# Patient Record
Sex: Male | Born: 1950 | Race: Black or African American | Hispanic: No | Marital: Single | Smoking: Former smoker
Health system: Southern US, Community
[De-identification: ages and names within clinical notes are randomized; demographics above are authoritative.]

## PROBLEM LIST (undated history)

## (undated) DIAGNOSIS — F1910 Other psychoactive substance abuse, uncomplicated: Secondary | ICD-10-CM

## (undated) DIAGNOSIS — I671 Cerebral aneurysm, nonruptured: Secondary | ICD-10-CM

## (undated) DIAGNOSIS — G8190 Hemiplegia, unspecified affecting unspecified side: Secondary | ICD-10-CM

## (undated) DIAGNOSIS — R269 Unspecified abnormalities of gait and mobility: Secondary | ICD-10-CM

## (undated) DIAGNOSIS — N1330 Unspecified hydronephrosis: Secondary | ICD-10-CM

## (undated) DIAGNOSIS — Z86010 Personal history of colonic polyps: Secondary | ICD-10-CM

## (undated) DIAGNOSIS — F0150 Vascular dementia without behavioral disturbance: Secondary | ICD-10-CM

## (undated) DIAGNOSIS — E7800 Pure hypercholesterolemia, unspecified: Secondary | ICD-10-CM

## (undated) DIAGNOSIS — H9190 Unspecified hearing loss, unspecified ear: Secondary | ICD-10-CM

## (undated) DIAGNOSIS — E559 Vitamin D deficiency, unspecified: Secondary | ICD-10-CM

## (undated) DIAGNOSIS — R32 Unspecified urinary incontinence: Secondary | ICD-10-CM

## (undated) DIAGNOSIS — I10 Essential (primary) hypertension: Secondary | ICD-10-CM

## (undated) DIAGNOSIS — R0600 Dyspnea, unspecified: Secondary | ICD-10-CM

## (undated) DIAGNOSIS — I872 Venous insufficiency (chronic) (peripheral): Secondary | ICD-10-CM

## (undated) HISTORY — PX: TUMOR REMOVAL: SHX12

## (undated) HISTORY — DX: Essential (primary) hypertension: I10

## (undated) HISTORY — PX: HIP SURGERY: SHX245

## (undated) HISTORY — DX: Other psychoactive substance abuse, uncomplicated: F19.10

## (undated) HISTORY — DX: Cerebral aneurysm, nonruptured: I67.1

## (undated) HISTORY — PX: VENTRICULOPERITONEAL SHUNT: SHX204

## (undated) HISTORY — DX: Personal history of colonic polyps: Z86.010

## (undated) HISTORY — DX: Unspecified hearing loss, unspecified ear: H91.90

## (undated) HISTORY — DX: Unspecified urinary incontinence: R32

## (undated) HISTORY — DX: Unspecified abnormalities of gait and mobility: R26.9

---

## 2009-09-06 ENCOUNTER — Ambulatory Visit: Payer: Self-pay | Admitting: Internal Medicine

## 2009-09-06 ENCOUNTER — Encounter (INDEPENDENT_AMBULATORY_CARE_PROVIDER_SITE_OTHER): Payer: Self-pay | Admitting: Family Medicine

## 2009-09-06 LAB — CONVERTED CEMR LAB
ALT: 23 units/L (ref 0–53)
AST: 26 units/L (ref 0–37)
Basophils Relative: 1 % (ref 0–1)
Chloride: 105 meq/L (ref 96–112)
Eosinophils Relative: 4 % (ref 0–5)
Hemoglobin: 13.6 g/dL (ref 13.0–17.0)
Lymphs Abs: 1.4 10*3/uL (ref 0.7–4.0)
MCHC: 32.5 g/dL (ref 30.0–36.0)
MCV: 88.2 fL (ref 78.0–100.0)
Monocytes Relative: 6 % (ref 3–12)
Neutro Abs: 3.5 10*3/uL (ref 1.7–7.7)
Neutrophils Relative %: 64 % (ref 43–77)
Sodium: 143 meq/L (ref 135–145)
TSH: 2.632 microintl units/mL (ref 0.350–4.500)
Total Bilirubin: 0.3 mg/dL (ref 0.3–1.2)
Total Protein: 7.3 g/dL (ref 6.0–8.3)
WBC: 5.5 10*3/uL (ref 4.0–10.5)

## 2010-04-15 ENCOUNTER — Encounter: Payer: Self-pay | Admitting: Internal Medicine

## 2010-04-15 ENCOUNTER — Encounter: Payer: Self-pay | Admitting: Family Medicine

## 2011-10-17 ENCOUNTER — Emergency Department (HOSPITAL_COMMUNITY): Payer: Medicaid Other | Admitting: Anesthesiology

## 2011-10-17 ENCOUNTER — Encounter (HOSPITAL_COMMUNITY): Payer: Self-pay | Admitting: Anesthesiology

## 2011-10-17 ENCOUNTER — Inpatient Hospital Stay (HOSPITAL_COMMUNITY)
Admission: EM | Admit: 2011-10-17 | Discharge: 2011-10-29 | DRG: 031 | Disposition: A | Discharge status: Skilled Nursing Facility | Payer: Medicaid Other | Attending: Neurosurgery | Admitting: Neurosurgery

## 2011-10-17 ENCOUNTER — Emergency Department (HOSPITAL_COMMUNITY): Payer: Medicaid Other

## 2011-10-17 ENCOUNTER — Encounter (HOSPITAL_COMMUNITY)
Admission: EM | Disposition: A | Discharge status: Skilled Nursing Facility | Payer: Self-pay | Source: Home / Self Care | Attending: Neurosurgery

## 2011-10-17 ENCOUNTER — Encounter (HOSPITAL_COMMUNITY): Payer: Self-pay

## 2011-10-17 ENCOUNTER — Inpatient Hospital Stay: Admit: 2011-10-17 | Payer: Self-pay | Admitting: Neurosurgery

## 2011-10-17 DIAGNOSIS — R410 Disorientation, unspecified: Secondary | ICD-10-CM | POA: Diagnosis present

## 2011-10-17 DIAGNOSIS — G9340 Encephalopathy, unspecified: Secondary | ICD-10-CM | POA: Diagnosis present

## 2011-10-17 DIAGNOSIS — Z23 Encounter for immunization: Secondary | ICD-10-CM

## 2011-10-17 DIAGNOSIS — T85695A Other mechanical complication of other nervous system device, implant or graft, initial encounter: Principal | ICD-10-CM | POA: Diagnosis present

## 2011-10-17 DIAGNOSIS — R471 Dysarthria and anarthria: Secondary | ICD-10-CM | POA: Diagnosis present

## 2011-10-17 DIAGNOSIS — R4182 Altered mental status, unspecified: Secondary | ICD-10-CM | POA: Diagnosis present

## 2011-10-17 DIAGNOSIS — Y831 Surgical operation with implant of artificial internal device as the cause of abnormal reaction of the patient, or of later complication, without mention of misadventure at the time of the procedure: Secondary | ICD-10-CM | POA: Diagnosis present

## 2011-10-17 DIAGNOSIS — I10 Essential (primary) hypertension: Secondary | ICD-10-CM | POA: Diagnosis present

## 2011-10-17 DIAGNOSIS — Z781 Physical restraint status: Secondary | ICD-10-CM | POA: Diagnosis present

## 2011-10-17 DIAGNOSIS — E870 Hyperosmolality and hypernatremia: Secondary | ICD-10-CM | POA: Diagnosis not present

## 2011-10-17 DIAGNOSIS — R34 Anuria and oliguria: Secondary | ICD-10-CM | POA: Diagnosis not present

## 2011-10-17 DIAGNOSIS — R000 Tachycardia, unspecified: Secondary | ICD-10-CM | POA: Diagnosis present

## 2011-10-17 DIAGNOSIS — R404 Transient alteration of awareness: Secondary | ICD-10-CM | POA: Diagnosis present

## 2011-10-17 DIAGNOSIS — N289 Disorder of kidney and ureter, unspecified: Secondary | ICD-10-CM | POA: Diagnosis present

## 2011-10-17 DIAGNOSIS — G919 Hydrocephalus, unspecified: Secondary | ICD-10-CM | POA: Diagnosis present

## 2011-10-17 DIAGNOSIS — G911 Obstructive hydrocephalus: Secondary | ICD-10-CM | POA: Diagnosis present

## 2011-10-17 DIAGNOSIS — T07XXXA Unspecified multiple injuries, initial encounter: Secondary | ICD-10-CM

## 2011-10-17 HISTORY — PX: VENTRICULOPERITONEAL SHUNT: SHX204

## 2011-10-17 LAB — URINALYSIS, ROUTINE W REFLEX MICROSCOPIC
Bilirubin Urine: NEGATIVE
Glucose, UA: NEGATIVE mg/dL
Ketones, ur: 40 mg/dL — AB
Nitrite: NEGATIVE
pH: 6 (ref 5.0–8.0)

## 2011-10-17 LAB — CSF CELL COUNT WITH DIFFERENTIAL: Other Cells, CSF: 0

## 2011-10-17 LAB — RAPID URINE DRUG SCREEN, HOSP PERFORMED
Amphetamines: NOT DETECTED
Barbiturates: NOT DETECTED
Benzodiazepines: NOT DETECTED
Cocaine: NOT DETECTED
Tetrahydrocannabinol: NOT DETECTED

## 2011-10-17 LAB — COMPREHENSIVE METABOLIC PANEL
AST: 25 U/L (ref 0–37)
Alkaline Phosphatase: 64 U/L (ref 39–117)
BUN: 15 mg/dL (ref 6–23)
CO2: 20 mEq/L (ref 19–32)
Chloride: 106 mEq/L (ref 96–112)
Creatinine, Ser: 1.4 mg/dL — ABNORMAL HIGH (ref 0.50–1.35)
GFR calc non Af Amer: 53 mL/min — ABNORMAL LOW (ref >90)
Potassium: 3.9 mEq/L (ref 3.5–5.1)
Total Bilirubin: 0.1 mg/dL — ABNORMAL LOW (ref 0.3–1.2)

## 2011-10-17 LAB — GRAM STAIN

## 2011-10-17 LAB — URINE MICROSCOPIC-ADD ON

## 2011-10-17 LAB — CBC
HCT: 42.4 % (ref 39.0–52.0)
MCV: 85.8 fL (ref 78.0–100.0)
Platelets: 218 10*3/uL (ref 150–400)
RBC: 4.94 MIL/uL (ref 4.22–5.81)
WBC: 8 10*3/uL (ref 4.0–10.5)

## 2011-10-17 LAB — MRSA PCR SCREENING: MRSA by PCR: NEGATIVE

## 2011-10-17 LAB — PROTIME-INR: Prothrombin Time: 16.3 seconds — ABNORMAL HIGH (ref 11.6–15.2)

## 2011-10-17 LAB — PROTEIN AND GLUCOSE, CSF: Glucose, CSF: 71 mg/dL (ref 43–76)

## 2011-10-17 LAB — AMMONIA: Ammonia: 23 umol/L (ref 11–60)

## 2011-10-17 SURGERY — SHUNT INSERTION VENTRICULAR-PERITONEAL
Anesthesia: General | Site: Head | Laterality: Right | Wound class: Clean

## 2011-10-17 MED ORDER — LACTATED RINGERS IV SOLN
INTRAVENOUS | Status: DC
  Administered 2011-10-17: 16:00:00 via INTRAVENOUS
  Administered 2011-10-17: 75 mL/h via INTRAVENOUS
  Administered 2011-10-18: 19:00:00 via INTRAVENOUS
  Administered 2011-10-19: 75 mL/h via INTRAVENOUS

## 2011-10-17 MED ORDER — MORPHINE SULFATE 2 MG/ML IJ SOLN
1.0000 mg | INTRAMUSCULAR | Status: DC | PRN
  Administered 2011-10-17 – 2011-10-22 (×4): 2 mg via INTRAVENOUS
  Filled 2011-10-17 (×4): qty 1

## 2011-10-17 MED ORDER — PHENYLEPHRINE HCL 10 MG/ML IJ SOLN
10.0000 mg | INTRAVENOUS | Status: DC | PRN
  Administered 2011-10-17: 50 ug/min via INTRAVENOUS

## 2011-10-17 MED ORDER — LORAZEPAM 2 MG/ML IJ SOLN
INTRAMUSCULAR | Status: AC
  Filled 2011-10-17: qty 1

## 2011-10-17 MED ORDER — HYDROCODONE-ACETAMINOPHEN 5-325 MG PO TABS
1.0000 | ORAL_TABLET | ORAL | Status: DC | PRN
  Administered 2011-10-24 – 2011-10-27 (×6): 1 via ORAL
  Filled 2011-10-17 (×8): qty 1

## 2011-10-17 MED ORDER — SUCCINYLCHOLINE CHLORIDE 20 MG/ML IJ SOLN
INTRAMUSCULAR | Status: DC | PRN
  Administered 2011-10-17: 200 mg via INTRAVENOUS

## 2011-10-17 MED ORDER — LABETALOL HCL 5 MG/ML IV SOLN: 10.0000 mg | INTRAVENOUS | Status: DC | PRN

## 2011-10-17 MED ORDER — LORAZEPAM 2 MG/ML IJ SOLN
1.0000 mg | Freq: Four times a day (QID) | INTRAMUSCULAR | Status: DC | PRN
  Administered 2011-10-17 – 2011-10-18 (×3): 1 mg via INTRAVENOUS
  Filled 2011-10-17 (×2): qty 1

## 2011-10-17 MED ORDER — LORAZEPAM 1 MG PO TABS
1.0000 mg | ORAL_TABLET | Freq: Once | ORAL | Status: AC
  Administered 2011-10-17: 1 mg via ORAL

## 2011-10-17 MED ORDER — THROMBIN 5000 UNITS EX SOLR
CUTANEOUS | Status: DC | PRN
  Administered 2011-10-17 (×2): 5000 [IU] via TOPICAL

## 2011-10-17 MED ORDER — HEMOSTATIC AGENTS (NO CHARGE) OPTIME
TOPICAL | Status: DC | PRN
  Administered 2011-10-17: 1 via TOPICAL

## 2011-10-17 MED ORDER — GLYCOPYRROLATE 0.2 MG/ML IJ SOLN
INTRAMUSCULAR | Status: DC | PRN
  Administered 2011-10-17: 0.6 mg via INTRAVENOUS

## 2011-10-17 MED ORDER — BUPIVACAINE-EPINEPHRINE 0.5% -1:200000 IJ SOLN
INTRAMUSCULAR | Status: DC | PRN
  Administered 2011-10-17: 17 mL

## 2011-10-17 MED ORDER — MEPERIDINE HCL 25 MG/ML IJ SOLN: 6.2500 mg | INTRAMUSCULAR | Status: DC | PRN

## 2011-10-17 MED ORDER — ROCURONIUM BROMIDE 100 MG/10ML IV SOLN
INTRAVENOUS | Status: DC | PRN
  Administered 2011-10-17: 50 mg via INTRAVENOUS

## 2011-10-17 MED ORDER — ZIPRASIDONE MESYLATE 20 MG IM SOLR
10.0000 mg | Freq: Once | INTRAMUSCULAR | Status: AC
  Administered 2011-10-17: 10 mg via INTRAMUSCULAR
  Filled 2011-10-17: qty 20

## 2011-10-17 MED ORDER — CEFAZOLIN SODIUM 1-5 GM-% IV SOLN
INTRAVENOUS | Status: DC | PRN
  Administered 2011-10-17: 2 g via INTRAVENOUS

## 2011-10-17 MED ORDER — CEFAZOLIN SODIUM-DEXTROSE 2-3 GM-% IV SOLR
2.0000 g | Freq: Three times a day (TID) | INTRAVENOUS | Status: AC
  Administered 2011-10-17 – 2011-10-18 (×2): 2 g via INTRAVENOUS
  Filled 2011-10-17 (×2): qty 50

## 2011-10-17 MED ORDER — PROMETHAZINE HCL 25 MG/ML IJ SOLN
6.2500 mg | INTRAMUSCULAR | Status: DC | PRN
  Filled 2011-10-17: qty 1

## 2011-10-17 MED ORDER — PANTOPRAZOLE SODIUM 40 MG IV SOLR
40.0000 mg | Freq: Every day | INTRAVENOUS | Status: DC
  Administered 2011-10-17 – 2011-10-23 (×7): 40 mg via INTRAVENOUS
  Filled 2011-10-17 (×9): qty 40

## 2011-10-17 MED ORDER — MIDAZOLAM HCL 2 MG/2ML IJ SOLN: 0.5000 mg | Freq: Once | INTRAMUSCULAR | Status: DC | PRN

## 2011-10-17 MED ORDER — PROMETHAZINE HCL 25 MG PO TABS: 12.5000 mg | ORAL_TABLET | ORAL | Status: DC | PRN

## 2011-10-17 MED ORDER — LORAZEPAM 0.5 MG PO TABS
ORAL_TABLET | ORAL | Status: AC
  Filled 2011-10-17: qty 2

## 2011-10-17 MED ORDER — PHENYLEPHRINE HCL 10 MG/ML IJ SOLN
INTRAMUSCULAR | Status: DC | PRN
  Administered 2011-10-17 (×2): 80 ug via INTRAVENOUS
  Administered 2011-10-17: 120 ug via INTRAVENOUS
  Administered 2011-10-17: 160 ug via INTRAVENOUS
  Administered 2011-10-17: 40 ug via INTRAVENOUS

## 2011-10-17 MED ORDER — 0.9 % SODIUM CHLORIDE (POUR BTL) OPTIME
TOPICAL | Status: DC | PRN
  Administered 2011-10-17: 1000 mL

## 2011-10-17 MED ORDER — SODIUM CHLORIDE 0.9 % IV SOLN
INTRAVENOUS | Status: DC | PRN
  Administered 2011-10-17 (×2): via INTRAVENOUS

## 2011-10-17 MED ORDER — NEOSTIGMINE METHYLSULFATE 1 MG/ML IJ SOLN
INTRAMUSCULAR | Status: DC | PRN
  Administered 2011-10-17: 4 mg via INTRAVENOUS

## 2011-10-17 MED ORDER — HYDROMORPHONE HCL PF 1 MG/ML IJ SOLN: 0.2500 mg | INTRAMUSCULAR | Status: DC | PRN

## 2011-10-17 MED ORDER — PROPOFOL 10 MG/ML IV EMUL
INTRAVENOUS | Status: DC | PRN
  Administered 2011-10-17: 200 mg via INTRAVENOUS

## 2011-10-17 MED ORDER — ACETAMINOPHEN 650 MG RE SUPP: 650.0000 mg | RECTAL | Status: DC | PRN

## 2011-10-17 MED ORDER — ACETAMINOPHEN 325 MG PO TABS
650.0000 mg | ORAL_TABLET | ORAL | Status: DC | PRN
  Administered 2011-10-23: 650 mg via ORAL
  Filled 2011-10-17: qty 1

## 2011-10-17 MED ORDER — LIDOCAINE-EPINEPHRINE 1 %-1:100000 IJ SOLN
INTRAMUSCULAR | Status: DC | PRN
  Administered 2011-10-17: 17 mL

## 2011-10-17 MED ORDER — DOCUSATE SODIUM 100 MG PO CAPS
100.0000 mg | ORAL_CAPSULE | Freq: Two times a day (BID) | ORAL | Status: DC
  Administered 2011-10-22: 100 mg via ORAL
  Filled 2011-10-17 (×11): qty 1

## 2011-10-17 MED ORDER — BACITRACIN ZINC 500 UNIT/GM EX OINT
TOPICAL_OINTMENT | CUTANEOUS | Status: DC | PRN
  Administered 2011-10-17: 1 via TOPICAL

## 2011-10-17 MED ORDER — ONDANSETRON HCL 4 MG PO TABS: 4.0000 mg | ORAL_TABLET | ORAL | Status: DC | PRN

## 2011-10-17 MED ORDER — ONDANSETRON HCL 4 MG/2ML IJ SOLN: 4.0000 mg | INTRAMUSCULAR | Status: DC | PRN

## 2011-10-17 SURGICAL SUPPLY — 75 items
BAG DECANTER FOR FLEXI CONT (MISCELLANEOUS) ×2 IMPLANT
BENZOIN TINCTURE PRP APPL 2/3 (GAUZE/BANDAGES/DRESSINGS) ×2 IMPLANT
BLADE SURG 10 STRL SS (BLADE) ×4 IMPLANT
BLADE SURG 15 STRL LF DISP TIS (BLADE) ×1 IMPLANT
BLADE SURG 15 STRL SS (BLADE) ×1
BLADE SURG ROTATE 9660 (MISCELLANEOUS) ×4 IMPLANT
BNDG COHESIVE 4X5 TAN NS LF (GAUZE/BANDAGES/DRESSINGS) ×2 IMPLANT
BOOT SUTURE AID YELLOW STND (SUTURE) IMPLANT
BRUSH SCRUB EZ 1% IODOPHOR (MISCELLANEOUS) IMPLANT
BRUSH SCRUB EZ PLAIN DRY (MISCELLANEOUS) ×2 IMPLANT
BUR ACORN 6.0 PRECISION (BURR) ×2 IMPLANT
CANISTER SUCTION 2500CC (MISCELLANEOUS) ×2 IMPLANT
CLIP RANEY DISP (INSTRUMENTS) IMPLANT
CLOTH BEACON ORANGE TIMEOUT ST (SAFETY) ×2 IMPLANT
CONT SPEC 4OZ CLIKSEAL STRL BL (MISCELLANEOUS) ×4 IMPLANT
CORDS BIPOLAR (ELECTRODE) ×2 IMPLANT
COVER MAYO STAND STRL (DRAPES) ×2 IMPLANT
DRAPE INCISE IOBAN 85X60 (DRAPES) ×2 IMPLANT
DRAPE ORTHO SPLIT 77X108 STRL (DRAPES) ×1
DRAPE POUCH INSTRU U-SHP 10X18 (DRAPES) ×2 IMPLANT
DRAPE PROXIMA HALF (DRAPES) ×2 IMPLANT
DRAPE STERI IOBAN 125X83 (DRAPES) ×2 IMPLANT
DRAPE SURG 17X23 STRL (DRAPES) ×8 IMPLANT
DRAPE SURG ORHT 6 SPLT 77X108 (DRAPES) ×1 IMPLANT
DRESSING TELFA 8X3 (GAUZE/BANDAGES/DRESSINGS) ×2 IMPLANT
ELECT CAUTERY BLADE 6.4 (BLADE) ×2 IMPLANT
ELECT REM PT RETURN 9FT ADLT (ELECTROSURGICAL) ×2
ELECTRODE REM PT RTRN 9FT ADLT (ELECTROSURGICAL) ×1 IMPLANT
GAUZE SPONGE 4X4 16PLY XRAY LF (GAUZE/BANDAGES/DRESSINGS) ×2 IMPLANT
GLOVE BIO SURGEON STRL SZ8.5 (GLOVE) ×2 IMPLANT
GLOVE EXAM NITRILE LRG STRL (GLOVE) IMPLANT
GLOVE EXAM NITRILE MD LF STRL (GLOVE) ×4 IMPLANT
GLOVE EXAM NITRILE XL STR (GLOVE) IMPLANT
GLOVE EXAM NITRILE XS STR PU (GLOVE) IMPLANT
GLOVE SS BIOGEL STRL SZ 8 (GLOVE) ×1 IMPLANT
GLOVE SUPERSENSE BIOGEL SZ 8 (GLOVE) ×1
GOWN BRE IMP SLV AUR LG STRL (GOWN DISPOSABLE) IMPLANT
GOWN BRE IMP SLV AUR XL STRL (GOWN DISPOSABLE) ×2 IMPLANT
GOWN STRL REIN 2XL LVL4 (GOWN DISPOSABLE) ×2 IMPLANT
KIT BASIN OR (CUSTOM PROCEDURE TRAY) ×2 IMPLANT
KIT ROOM TURNOVER OR (KITS) ×2 IMPLANT
NEEDLE HYPO 22GX1.5 SAFETY (NEEDLE) ×2 IMPLANT
NS IRRIG 1000ML POUR BTL (IV SOLUTION) ×2 IMPLANT
PACK EENT II TURBAN DRAPE (CUSTOM PROCEDURE TRAY) ×2 IMPLANT
PAD ARMBOARD 7.5X6 YLW CONV (MISCELLANEOUS) ×2 IMPLANT
PATTIES SURGICAL .5 X.5 (GAUZE/BANDAGES/DRESSINGS) IMPLANT
PATTIES SURGICAL 1X1 (DISPOSABLE) ×2 IMPLANT
PENCIL BUTTON HOLSTER BLD 10FT (ELECTRODE) ×2 IMPLANT
SHEATH PERITONEAL INTRO 46 (MISCELLANEOUS) IMPLANT
SHEATH PERITONEAL INTRO 61 (MISCELLANEOUS) ×2 IMPLANT
SPONGE GAUZE 4X4 12PLY (GAUZE/BANDAGES/DRESSINGS) ×2 IMPLANT
SPONGE LAP 4X18 X RAY DECT (DISPOSABLE) ×2 IMPLANT
SPONGE SURGIFOAM ABS GEL SZ50 (HEMOSTASIS) ×2 IMPLANT
STAPLER SKIN PROX WIDE 3.9 (STAPLE) IMPLANT
STAPLER VISISTAT 35W (STAPLE) ×2 IMPLANT
STRIP CLOSURE SKIN 1/2X4 (GAUZE/BANDAGES/DRESSINGS) ×2 IMPLANT
SUT BONE WAX W31G (SUTURE) ×2 IMPLANT
SUT ETHILON 3 0 PS 1 (SUTURE) IMPLANT
SUT NURALON 4 0 TR CR/8 (SUTURE) IMPLANT
SUT SILK 0 TIES 10X30 (SUTURE) ×4 IMPLANT
SUT SILK 3 0 SH 30 (SUTURE) ×2 IMPLANT
SUT VIC AB 1 CT1 18XBRD ANBCTR (SUTURE) ×1 IMPLANT
SUT VIC AB 1 CT1 8-18 (SUTURE) ×1
SUT VIC AB 2-0 CP2 18 (SUTURE) ×4 IMPLANT
SUT VIC AB 3-0 SH 8-18 (SUTURE) ×2 IMPLANT
SYR BULB 3OZ (MISCELLANEOUS) ×2 IMPLANT
SYR CONTROL 10ML LL (SYRINGE) ×2 IMPLANT
TAPE CLOTH SURG 4X10 WHT LF (GAUZE/BANDAGES/DRESSINGS) ×2 IMPLANT
TOWEL OR 17X24 6PK STRL BLUE (TOWEL DISPOSABLE) ×2 IMPLANT
TOWEL OR 17X26 10 PK STRL BLUE (TOWEL DISPOSABLE) ×2 IMPLANT
TRAY FOLEY CATH 14FRSI W/METER (CATHETERS) ×2 IMPLANT
TUBE CONNECTING 12X1/4 (SUCTIONS) ×2 IMPLANT
UNDERPAD 30X30 INCONTINENT (UNDERPADS AND DIAPERS) IMPLANT
VALVE RT ANGLE UNITIZE DIST (Valve) ×2 IMPLANT
WATER STERILE IRR 1000ML POUR (IV SOLUTION) ×2 IMPLANT

## 2011-10-17 NOTE — Progress Notes (Signed)
EXTUBATION:  Patient responsive to voice, good TV with intact gag reflex, VSS.  Suctioned OP and extubated to [facility].  Good airway.    Sandford Craze, MD

## 2011-10-17 NOTE — Anesthesia Preprocedure Evaluation (Addendum)
Anesthesia Evaluation  Patient identified by MRN, date of birth, ID band Patient confused and Patient unresponsive    Reviewed: Allergy & Precautions, Unable to perform ROS - Chart review only  Airway       Dental   Pulmonary          Cardiovascular     Neuro/Psych ? Has had aneurysm clipped at age 61y/o, ?shunt placed at that time? Has ventricular shunt (?VP or [facility]), became very combative and confused, agitated today. ED staff unable to evaluate, is handcuffed and sedated presently    GI/Hepatic   Endo/Other    Renal/GU      Musculoskeletal   Abdominal   Peds  Hematology   Anesthesia Other Findings uncooperative  Reproductive/Obstetrics                          Anesthesia Physical Anesthesia Plan  ASA: III and Emergent  Anesthesia Plan: General   Post-op Pain Management:    Induction: Intravenous and Rapid sequence  Airway Management Planned: Oral ETT  Additional Equipment:   Intra-op Plan:   Post-operative Plan: Extubation in OR  Informed Consent:   Only emergency history available  Plan Discussed with: CRNA and Surgeon  Anesthesia Plan Comments: (Plan routine monitors, RSI GETA)        Anesthesia Quick Evaluation

## 2011-10-17 NOTE — Transfer of Care (Signed)
Immediate Anesthesia Transfer of Care Note  Patient: Kurt Walton  Procedure(s) Performed: Procedure(s) (LRB): SHUNT INSERTION VENTRICULAR-PERITONEAL (Right)  Patient Location: PACU  Anesthesia Type: General  Level of Consciousness: unresponsive and Patient remains intubated per anesthesia plan  Airway & Oxygen Therapy: Patient Spontanous Breathing, Patient connected to T-piece oxygen and Patient remains intubated per anesthesia plan  Post-op Assessment: Report given to PACU RN and Post -op Vital signs reviewed and stable  Post vital signs: Reviewed and stable  Complications: No apparent anesthesia complications

## 2011-10-17 NOTE — ED Notes (Signed)
Pt pulled IV out, pulling wires, trying to get out of bed. Unable to reason with patient. Attempt to reorient patient, try to distract pt with TV. CN notified, sitter ordered for patient

## 2011-10-17 NOTE — ED Notes (Signed)
Pt aggressive. Medication given. Taking pt to neuro OR. Dr. Lovell Sheehan down to see patient. MD notified pt has no IV access due to pulling them out. Will be traveling upstairs with patient. Secretary calling surrounding hospital to determine if patient has records available.

## 2011-10-17 NOTE — ED Notes (Signed)
Pt more calm, no longer fighting staff, resting.

## 2011-10-17 NOTE — Anesthesia Procedure Notes (Addendum)
Performed by: Arlice Colt B   Procedure Name: Intubation Date/Time: 10/17/2011 10:56 AM Performed by: Arlice Colt B Pre-anesthesia Checklist: Patient identified, Emergency Drugs available, Suction available, Patient being monitored and Timeout performed Patient Re-evaluated:Patient Re-evaluated prior to inductionOxygen Delivery Method: Circle system utilized Preoxygenation: Pre-oxygenation with 100% oxygen Intubation Type: IV induction and Rapid sequence Laryngoscope Size: Mac and 3 Grade View: Grade I Tube type: Oral Tube size: 7.5 mm Number of attempts: 1 Airway Equipment and Method: Stylet Placement Confirmation: ETT inserted through vocal cords under direct vision,  positive ETCO2 and breath sounds checked- equal and bilateral Secured at: 23 cm Tube secured with: Tape Dental Injury: Teeth and Oropharynx as per pre-operative assessment

## 2011-10-17 NOTE — Op Note (Signed)
Brief history: The patient is a 61 year old black male who was found with a decreased level of consciousness in his yard today. He was brought to the emergency department. A head CT was obtained and demonstrated hydrocephalus and an old shunt. The patient cannot provide his history and is no family available to discuss and there is no family to discuss the situation with. We are going to proceed with a ventriculoperitoneal shunt as an emergency.  Preop diagnosis: Making hydrocephalus, shunt malfunction  Postop diagnosis: The same  Procedure: Placement of a right retroauricular ventriculoperitoneal shunt (Codman programmable shunt set at 100 mmHg)  Surgeon: Dr. Delma Officer  Assistant: None  Anesthesia: Gen. endotracheal  Specimens: Cerebral spinal fluid  Drains: None  Complications: None  Description of procedure: The patient was brought to the operating room by the anesthesia team. General endotracheal anesthesia was induced. The patient remained in the supine position. A roll was placed under his right shoulder the patient's calvarium was turned to the left. The patient's right scalp was then shaved with clippers and this region as well as his neck thorax and abdomen were prepared with Betadine scrub and Betadine solution. Sterile drapes were applied. I then injected the area to be incised with Marcaine with epinephrine solution.  I used a scalpel to make a right retroauricular scalp incision. We used a cerebellar retractor for exposure. I then made an incision in the patient's right upper quadrant of his abdomen. I then placed a shunt passer from the abdominal incision up to the scalp incision. We then passed the shunt catheter through the passer from the scalp incision to the abdominal incision. We then removed the shunt passer.  I then used a high-speed drill to create a right posterior parietal burr hole. I coagulated the underlying dura. Incised. I used bipolar electrocautery to  quickly the surface of the patient's brain creating small corticotomy. I then passed the trigger catheter into the occipital horn of the lateral ventricle with the first pass and approximately 4 cm. I then removed the stylette and threaded the catheter deeper to approximately 10 cm. There was good flow of cerebrospinal fluid under some pressure. We collect a CSF specimen and sent to the lab. We then cut the ventricular catheter to 10 cm I connected it to the bowel we secured it with a 0 silk tie. Then removed the cerebellar retractor and reapproximated patient's galea with interrupted 20 gargle suture and the skin with stainless steel staples.  I now turned my attention to the abdominal incision. I dissected through the adipose tissue to expose the anterior rectus sheath and divided with the Metzenbaum scissors. We then dissected through the rectus muscle and then identified the posterior rectus sheath. I incised this with the scissors and identified the underlying peritoneum. I incised this with the scissors entering into the perineal cavity. We identified the intestines. We confirmed that the catheter was patent by aspirating through the distal end of the peritoneal catheter but easily gets real spinal fluid. There was also spontaneous flow of cervical spinal fluid. I then inserted the distal end of the peritoneal catheter into the perineum. We then removed the retractors and reapproximated the patient's posterior rectus sheath with a single 2-0 Vicryl suture. The anterior rectus sheath with several interrupted 0 Vicryl sutures. The subcutaneous tissue with 2-0 Vicryl interrupted sutures and the skin with stainless steel staples. The wounds were then coated with bacitracin ointment. A sterile dressing was applied. The drapes were removed. The patient was  subsequently transported to the post anesthesia care unit intubated in stable condition. All sponge instrument and needle counts were reportedly correct at this  case.

## 2011-10-17 NOTE — ED Notes (Addendum)
EDP made aware of elevated 7.0 CK MB. Pt in CT, not lying still, wanting to get off bed. CT staff trying to reassure patient and calm him down. EDP reporting get best scan possible. Pt moved to room closer to nurse station to keep eye on patient. Sitter has been ordered

## 2011-10-17 NOTE — ED Notes (Signed)
Pt acting aggressive. Security at bedside. [employer] handcuffing pt to bed. Pt flailing extremities.

## 2011-10-17 NOTE — Progress Notes (Signed)
Patient ID: Kurt Walton, male   DOB: 08/20/1950, 61 y.o.   MRN: [id number] Subjective:  The patient is somnolent but arousable. He has little change from his preoperative status.  Objective: Vital signs in last 24 hours: Temp:  [98.4 F (36.9 C)-100.1 F (37.8 C)] 100.1 F (37.8 C) (07/25 1800) Pulse Rate:  [95-138] 111  (07/25 1900) Resp:  [15-29] 15  (07/25 1900) BP: (94-131)/(55-74) 100/66 mmHg (07/25 1900) SpO2:  [97 %-100 %] 99 % (07/25 1900) Weight:  [75.7 kg (166 lb 14.2 oz)] 75.7 kg (166 lb 14.2 oz) (07/25 1500)  Intake/Output from previous day:   Intake/Output this shift:    Physical exam patient is arousable, moving all 4 extremities, his pupils are equal, his dressings are clean and dry.  Lab Results:  [facility] 10/17/11 0715  WBC 8.0  HGB 14.7  HCT 42.4  PLT 218   BMET  Basename 10/17/11 0715  NA 146*  K 3.9  CL 106  CO2 20  GLUCOSE 115*  BUN 15  CREATININE 1.40*  CALCIUM 9.5    Studies/Results: Dg Skull 1-3 Views  10/17/2011  **ADDENDUM** CREATED: 10/17/2011 10:03:42  Findings discussed Dr. Lorenso Courier on 10/17/2011 at 1005 hours as well as with the neurosurgeon Dr. Lovell Sheehan.  **END ADDENDUM** SIGNED BY: Genevive Bi, M.D.   10/17/2011  *RADIOLOGY REPORT*  Clinical Data: Altered mental status.  SKULL - 1-3 VIEW  Comparison: None.  Findings: The patient was combative and required five attendants to stabilize the patient for imaging.  The ventriculostomy catheter is not imaged in full. The tip right lateral ventricle. The catheter extends into the central canal at the level of the foramen magnum as seen on comparison CT. There is a branching or fracture of the catheter at the level of the foramen magnum.  There is a second fracture of the catheter approximate 6 cm from the ventriculostomy catheter tip in the fourth ventricle.  IMPRESSION: 1.  Ventriculostomy catheter is fractured 6 cm from the tip in the fourth ventricle. 2.  Ventriculostomy catheters is  fractured and pinched at the level the foramen magnum. 3.  Catheter enters the central canal at the level of the foramen magnum as seen on comparison CT. Original Report Authenticated By: Genevive Bi, M.D.   Dg Chest 1 View  10/17/2011  *RADIOLOGY REPORT*  Clinical Data: Altered mental status  CHEST - 1 VIEW  Comparison: none  Findings: Normal cardiac silhouette.  There is apical capping on the right which may represent pleural fluid.  Mild central venous congestion.  No pulmonary edema or consolidation.  IMPRESSION:  1.  Central venous congestion.  2.  Right apical capping could represent pleural fluid.  Original Report Authenticated By: Genevive Bi, M.D.   Dg Abd 1 View  10/17/2011  *RADIOLOGY REPORT*  Clinical Data: Altered mental status  ABDOMEN - 1 VIEW  Comparison: None.  Findings: No dilated loops of large or small bowel.  No pathologic calcifications.  Scoliosis of the spine is noted.  IMPRESSION: No acute abdominal abnormality.  Original Report Authenticated By: Genevive Bi, M.D.   Ct Head Wo Contrast  10/17/2011  *RADIOLOGY REPORT*  Clinical Data: Altered mental status.  The patient found lying in yard.  Confusion,  CT HEAD WITHOUT CONTRAST  Technique:  Contiguous axial images were obtained from the base of the skull through the vertex without contrast.  Comparison: None.  Findings: There is a ventriculostomy catheter with the tip in the atrium of the right lateral ventricle.  This catheter extends through the occipital bone and reenters the occipital bone and extends into the central canal level of the foramen magnum.  There chronic cystic expansion along the catheter tract within the occipital bone.  There  moderate hydrocephalus with dilatation of the lateral ventricles, temporal horns, third ventricle and fourth ventricle. The fourth ventricle  is significantly dilated.  There is no evidence of cortical infarction.  No intracranial hemorrhage.  No evidence of skull base fracture.   There is no fluid in the paranasal sinuses or mastoid air cells.  IMPRESSION:  1.  Moderate hydrocephalus with marked expansion of the fourth ventricle in patient with ventriculostomy catheter. No comparison available.  2.  Right ventriculostomy catheter re-enters the central canal at the level of the foramen magnum.  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  Original Report Authenticated By: Genevive Bi, M.D.    Assessment/Plan: Status post ventriculoperitoneal shunt: The patient is neurologically stable. We will check a CAT scan in the morning.  LOS: 0 days     Kayce Chismar D 10/17/2011, 7:33 PM

## 2011-10-17 NOTE — ED Notes (Signed)
Security at bedside. Pt being uncooperative. Trying to get out of bed, taking gown, wires off. Pt sweating.

## 2011-10-17 NOTE — OR Nursing (Signed)
Codman Programmable Valve: Right Angle and Unitized Distal Catheter. Programmed to 100 mm or H20 with help from Brayton El, [employer] representative and per surgeon.

## 2011-10-17 NOTE — ED Notes (Signed)
Pt asked for urine sample unable to provide at this time.  

## 2011-10-17 NOTE — Anesthesia Postprocedure Evaluation (Signed)
  Anesthesia Post-op Note  Patient: Kurt Walton  Procedure(s) Performed: Procedure(s) (LRB): SHUNT INSERTION VENTRICULAR-PERITONEAL (Right)  Patient Location: PACU  Anesthesia Type: General  Level of Consciousness: sedated  Airway and Oxygen Therapy: Patient Spontanous Breathing and Patient connected to face mask oxygen  Post-op Pain: none  Post-op Assessment: Post-op Vital signs reviewed, Patient's Cardiovascular Status Stable, Respiratory Function Stable, Patent Airway, No signs of Nausea or vomiting and Pain level controlled  Post-op Vital Signs: Reviewed and stable  Complications: No apparent anesthesia complications

## 2011-10-17 NOTE — ED Notes (Addendum)
Pt in OR valuables with patient. Report given to Dr. Colon Branch.

## 2011-10-17 NOTE — ED Notes (Signed)
Patient transported to CT 

## 2011-10-17 NOTE — Progress Notes (Signed)
Patient is agitated with heart rate of 145. MD notified of patients heart rate and agitation. 1 mg Ativan and 2 mg Morphine ordered and given. Will continue to assess.

## 2011-10-17 NOTE — ED Provider Notes (Addendum)
History     CSN: [id number]  Arrival date & time 10/17/11  1191   First MD Initiated Contact with Patient 10/17/11 [phone]      Chief Complaint  Patient presents with  . Altered Mental Status    (Consider location/radiation/quality/duration/timing/severity/associated sxs/prior treatment) Patient is a 61 y.o. male presenting with altered mental status. The history is provided by the EMS personnel. The history is limited by the condition of the patient (pt altered, can provide no history).  Altered Mental Status This is a new problem. Episode onset: unknown. Progression since onset: unknown. Pertinent negatives include no chest pain, no abdominal pain, no headaches and no shortness of breath. Associated symptoms comments: Pt denies any complaints. He has tried nothing for the symptoms.  Pt was found down outside by a neighbor and has only been able to provide his name to EMS staff.  No past medical history on file.  No past surgical history on file.  No family history on file.  History  Substance Use Topics  . Smoking status: Not on file  . Smokeless tobacco: Not on file  . Alcohol Use: Not on file      Review of Systems  Unable to perform ROS: Mental status change  Constitutional: Negative for fever.  HENT: Negative for ear pain, congestion, facial swelling, rhinorrhea, neck pain, neck stiffness and ear discharge.   Eyes: Negative for photophobia, pain, discharge, redness and itching.  Respiratory: Negative for cough, chest tightness and shortness of breath.   Cardiovascular: Negative for chest pain and leg swelling.  Gastrointestinal: Positive for diarrhea. Negative for vomiting, abdominal pain and abdominal distention.  Genitourinary: Negative for flank pain and penile pain.  Musculoskeletal: Negative for back pain.       Uses a walking stick and wears a support wrap on his right knee - "I always wear it and use my stick"  Skin: Positive for wound.       "I don't know  where it came from" - response to questions regarding abrasion on left knee  Neurological: Negative for tremors, seizures, light-headedness and headaches.  Psychiatric/Behavioral: Positive for altered mental status.    Allergies  Review of patient's allergies indicates no known allergies.  Home Medications   Current Outpatient Rx  Name Route Sig Dispense Refill  . ASPIRIN 81 MG PO CHEW Oral Chew 81 mg by mouth daily.      BP 124/71  Temp 98.8 F (37.1 C) (Oral)  Resp 16  SpO2 98%  Physical Exam  Constitutional: He appears well-developed and well-nourished. He is not intubated.  HENT:  Head: Normocephalic and atraumatic.  Right Ear: External ear normal.  Left Ear: External ear normal.  Nose: Nose normal.  Mouth/Throat: Oropharynx is clear and moist. No oropharyngeal exudate.  Eyes: Conjunctivae and EOM are normal. Pupils are equal, round, and reactive to light. Right eye exhibits no discharge. Left eye exhibits no discharge. No scleral icterus.  Neck: Normal range of motion. Neck supple. No JVD present. Carotid bruit is not present. No tracheal deviation present. No thyromegaly present.  Cardiovascular: Normal rate, regular rhythm, S1 normal, S2 normal, normal heart sounds and intact distal pulses.  Exam reveals no gallop and no friction rub.   No murmur heard. Pulmonary/Chest: Effort normal and breath sounds normal. No accessory muscle usage or stridor. No apnea, not tachypneic and not bradypneic. He is not intubated. No respiratory distress. He has no wheezes. He has no rales. He exhibits no tenderness.  Abdominal: Soft. Bowel  sounds are normal. He exhibits no shifting dullness, no distension, no fluid wave, no abdominal bruit, no ascites, no pulsatile midline mass and no mass. There is no tenderness. There is no rigidity, no rebound, no guarding, no CVA tenderness, no tenderness at McBurney's point and negative Murphy's sign.  Musculoskeletal: Normal range of motion. He exhibits  no edema.       Superficial abrasion overlying left knee, nl ROM, no signif discomfort with palp, no joint swelling or cripitans  Lymphadenopathy:    He has no cervical adenopathy.  Neurological: He is alert. He has normal strength. He displays no atrophy and no tremor. No cranial nerve deficit or sensory deficit. He exhibits normal muscle tone. He displays no seizure activity. GCS eye subscore is 4. GCS verbal subscore is 4. GCS motor subscore is 6. He displays no Babinski's sign on the right side. He displays no Babinski's sign on the left side.  Skin: Skin is warm and dry. No rash noted. No erythema. No pallor.     Psychiatric: His affect is not angry and not labile. His speech is not rapid and/or pressured, not delayed, not tangential and not slurred. He is aggressive, slowed and withdrawn. He is not agitated, is not hyperactive, not actively hallucinating and not combative. Thought content is paranoid. Cognition and memory are impaired. He expresses inappropriate judgment. He does not express impulsivity. He does not exhibit a depressed mood. He exhibits abnormal recent memory and abnormal remote memory.       Pt confused, only oriented to person, becomes defensive when asked questions regarding his current state, where he was found, his current location, past medical history.  He is however cooperative during physical exam. He is attentive.    ED Course  Procedures (including critical care time)   Labs Reviewed  CBC  COMPREHENSIVE METABOLIC PANEL  ETHANOL  AMMONIA  URINALYSIS, ROUTINE W REFLEX MICROSCOPIC  URINE RAPID DRUG SCREEN (HOSP PERFORMED)  PROTIME-INR  CK TOTAL AND CKMB   No results found.   No diagnosis found.  Date: 10/17/2011  Rate:100 bpm  Rhythm: sinus tachycardia  QRS Axis: normal  Intervals: normal  ST/T Wave abnormalities: nonspecific ST changes  Conduction Disutrbances:none  Narrative Interpretation: sinus rhythm with nonspec anterior ST changes  Old EKG  Reviewed: none available      MDM  61 yo presents confused, found down near home - currently only oriented to self but demonstrates stable vital signs and no distress.  Will initiate altered mental status evaluation including routine labs, tox screen, CK, CT head - will review results as available, perform serial reassessments and provide dispo as appropriate.  Pt has requested to leave the ER on numerous occasions now.  He is awake, alert, but still not oriented to his surroundings and demonstrates little to no insight into why he is in the emergency department.  Note an elevated CK/MB and mild elevation in creatinine but ammonia and alcohol levels are normal.  CT scan reveals ventriculostomy with distention of the 4th ventricle and hydrocephalus.  Kurt Walton cannot provide a history of prev brain surgery and there is no available previous imaging to state that this is a stable v an acute finding.  Plan obtain shunt series, on completion will contact the neurosurgeon oncall for further evaluation.  Anticipate admission secondary to altered mental status and hydrocephalus.  Pt is stable, neurosurgeon is at bedside.  Discussed his abnormal CT of head with Dr. Lovell Sheehan (nsgy) and Dr. Ty Hilts (radiologist).  Pt has  an abnormal appearing shunt, unsure if the device has fractured or if it is 2 separate devices.    Contacting other regional medical centers to see if pt has been evaluated there previously.  Administered ativan and geodon secondary to mild agitation.  Plan admit to NSGY, pt posted for OR spot for further evaluation of altered mental status and hydrocephalus.  CRITICAL CARE Performed by: Dana Allan   Total critical care time: 30-77min  Critical care time was exclusive of separately billable procedures and treating other patients.  Critical care was necessary to treat or prevent imminent or life-threatening deterioration.  Critical care was time spent personally by me on the  following activities: development of treatment plan with patient and/or surrogate as well as nursing, discussions with consultants, evaluation of patient's response to treatment, examination of patient, obtaining history from patient or surrogate, ordering and performing treatments and interventions, ordering and review of laboratory studies, ordering and review of radiographic studies, pulse oximetry and re-evaluation of patient's condition.      Tobin Chad, MD 10/17/11 1013  Alayla Dethlefs Jules Husbands, MD 10/17/11 1015

## 2011-10-17 NOTE — H&P (Signed)
Subjective: The patient is a 61 year old black male who by report was found with a decreased level of consciousness and combativeness in his backyard. The patient was brought to [facility] where he was evaluated by the emergency room staff. Evaluation included a CT scan. 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 A neurosurgical consultation was requested.  Attempts were made to contact the patient's family. The emergency room staff spoke to her brother who had not seen the patient in 15 years. There are no available family members to give consent or provide a history for [place].  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 As above this had a shunt placed but we don't know by whom or when. The patient is alert but confused and combative. Requiring 4. restraints.  History reviewed. No pertinent past medical history.  History reviewed. No pertinent past surgical history.  No Known Allergies  History  Substance Use Topics  . Smoking status: Not on file  . Smokeless tobacco: Not on file  . Alcohol Use: Not on file    History reviewed. No pertinent family history. Prior to Admission medications   Medication Sig Start Date End Date Taking? Authorizing Provider  aspirin 81 MG chewable tablet Chew 81 mg by mouth daily.   Yes Historical Provider, MD     Review of Systems  Positive ROS: Not obtainable  All other systems have been reviewed and were otherwise negative with the exception of those mentioned in the HPI and as above.  Objective: Vital signs in last 24 hours: Temp:  [98.8 F (37.1 C)] 98.8 F (37.1 C) (07/25 0707) Pulse Rate:  [104-135] 135  (07/25 1013) Resp:  [16-22] 22  (07/25 1013) BP: (117-131)/(68-74) 131/68 mmHg (07/25 1013) SpO2:  [97 %-98 %] 97 % (07/25 1013)  General Appearance: Alert, cooperative, no distress, appears stated age Head: Normocephalic, without obvious abnormality,  atraumatic Eyes: PERRL, conjunctiva/corneas clear, EOM's intact, fundi benign, both eyes      Ears: Normal TM's and external ear canals, both ears Throat: Lips, mucosa, and tongue normal; teeth and gums normal Neck: Supple, symmetrical, trachea midline, no adenopathy; thyroid: No enlargement/tenderness/nodules; no carotid bruit or JVD Back: Symmetric, no curvature, ROM normal, no CVA tenderness Lungs: Clear to auscultation bilaterally, respirations unlabored Heart: Regular rate and rhythm, S1 and S2 normal, no murmur, rub or gallop Abdomen: Soft, non-tender, bowel sounds active all four quadrants, no masses, no organomegaly Extremities: Extremities normal, atraumatic, no cyanosis or edema Pulses: 2+ and symmetric all extremities Skin: Skin color, texture, turgor normal, no rashes or lesions  NEUROLOGIC:   Mental status: alert, agitated, and a phasic.  Motor Exam - grossly normal he is moving all 4 extremities well. Sensory Exam - grossly normal Reflexes: Symmetric Coordination -not tested Gait -not tested Balance -not tested  Cranial Nerves: Grossly normal with a limited exam.                                                         Data Review Lab Results  Component Value Date   WBC 8.0 10/17/2011   HGB 14.7 10/17/2011   HCT 42.4 10/17/2011   MCV 85.8 10/17/2011   PLT 218 10/17/2011   Lab Results  Component Value Date   NA 146*  10/17/2011   K 3.9 10/17/2011   CL 106 10/17/2011   CO2 20 10/17/2011   BUN 15 10/17/2011   CREATININE 1.40* 10/17/2011   GLUCOSE 115* 10/17/2011   Lab Results  Component Value Date   INR 1.29 10/17/2011    Assessment/Plan: Communicating hydrocephalus, altered mental status: We have very limited information on this patient as above. The patient has shunts in place but has enlarged ventricles. We have no prior studies to compare with. We have no family to give [place] a history. The ER doctors can find no other cause of the patient's altered  mental status. I therefore have recommended a shunt revision/placement of a ventriculoperitoneal shunt. We will do this as an emergency as we have no family members available and the patient is not able to give consent.   Klare Criss D 10/17/2011 10:53 AM

## 2011-10-17 NOTE — Progress Notes (Signed)
Patient remains lethargic and difficult to arouse. Patient will respond to name with deep sternal rub. Ativan and Morphine given at 1600. MD notified of patient condition. CT scheduled for 0500.  No new orders at this time.

## 2011-10-17 NOTE — ED Notes (Signed)
Pt found by neighbor lying in front yard. Unsure of how long down or of any medical history. Was sitting on front porch when EMS arrived. Pt oriented to only self at that time. No other neuro deficits noted at that time except an unsteady gait.

## 2011-10-18 ENCOUNTER — Encounter (HOSPITAL_COMMUNITY): Payer: Self-pay | Admitting: Radiology

## 2011-10-18 ENCOUNTER — Inpatient Hospital Stay (HOSPITAL_COMMUNITY): Payer: Medicaid Other

## 2011-10-18 DIAGNOSIS — G919 Hydrocephalus, unspecified: Secondary | ICD-10-CM | POA: Diagnosis present

## 2011-10-18 DIAGNOSIS — I10 Essential (primary) hypertension: Secondary | ICD-10-CM | POA: Diagnosis present

## 2011-10-18 DIAGNOSIS — G911 Obstructive hydrocephalus: Secondary | ICD-10-CM

## 2011-10-18 DIAGNOSIS — R4182 Altered mental status, unspecified: Secondary | ICD-10-CM | POA: Diagnosis present

## 2011-10-18 DIAGNOSIS — G9340 Encephalopathy, unspecified: Secondary | ICD-10-CM | POA: Diagnosis present

## 2011-10-18 DIAGNOSIS — R410 Disorientation, unspecified: Secondary | ICD-10-CM | POA: Diagnosis present

## 2011-10-18 MED ORDER — DEXMEDETOMIDINE HCL IN NACL 200 MCG/50ML IV SOLN
0.2000 ug/kg/h | INTRAVENOUS | Status: AC
  Administered 2011-10-18 – 2011-10-19 (×2): 0.2 ug/kg/h via INTRAVENOUS
  Filled 2011-10-18 (×3): qty 50

## 2011-10-18 MED ORDER — BIOTENE DRY MOUTH MT LIQD
15.0000 mL | Freq: Two times a day (BID) | OROMUCOSAL | Status: DC
  Administered 2011-10-19 – 2011-10-22 (×7): 15 mL via OROMUCOSAL

## 2011-10-18 MED ORDER — CHLORHEXIDINE GLUCONATE 0.12 % MT SOLN
15.0000 mL | Freq: Two times a day (BID) | OROMUCOSAL | Status: DC
  Administered 2011-10-18 – 2011-10-29 (×16): 15 mL via OROMUCOSAL
  Filled 2011-10-18 (×24): qty 15

## 2011-10-18 MED ORDER — HALOPERIDOL LACTATE 5 MG/ML IJ SOLN
4.0000 mg | Freq: Once | INTRAMUSCULAR | Status: AC
  Administered 2011-10-18: 4 mg via INTRAVENOUS

## 2011-10-18 MED ORDER — HALOPERIDOL LACTATE 5 MG/ML IJ SOLN
INTRAMUSCULAR | Status: AC
  Filled 2011-10-18: qty 1

## 2011-10-18 MED ORDER — LORAZEPAM 2 MG/ML IJ SOLN
1.0000 mg | Freq: Once | INTRAMUSCULAR | Status: AC
  Administered 2011-10-18: 1 mg via INTRAVENOUS

## 2011-10-18 MED ORDER — HALOPERIDOL LACTATE 5 MG/ML IJ SOLN
1.0000 mg | INTRAMUSCULAR | Status: DC | PRN
  Administered 2011-10-18 – 2011-10-22 (×5): 4 mg via INTRAVENOUS
  Filled 2011-10-18 (×6): qty 1

## 2011-10-18 NOTE — Consult Note (Signed)
Name: Kurt Walton MRN: [id number] DOB: [birth year]    LOS: 1  Referring Provider:  Dr. Lovell Sheehan Reason for Referral:  Agitated Delirium    PULMONARY / CRITICAL CARE MEDICINE  HPI:  61 y/o AAM with PMH of Hydrocephalus / VP shunt (other hx unknown) presented to [facility] ED 7/25 after being found in back yard altered, decreased LOC and combative.  CT scan demonstrated hydrocephalus with old ventriculoperitoneal shunt. Taken to OR per NSGY for new shunt.  7/26 had increased agitation and PCCM consulted for delirium.     History reviewed. No pertinent past medical history. History reviewed. No pertinent past surgical history. Prior to Admission medications   Medication Sig Start Date End Date Taking? Authorizing Provider  aspirin 81 MG chewable tablet Chew 81 mg by mouth daily.   Yes Historical Provider, MD   Allergies No Known Allergies  Family History History reviewed. No pertinent family history. Social History  does not have a smoking history on file. He does not have any smokeless tobacco history on file. His alcohol and drug histories not on file.  Review Of Systems:  Unable to complete as patient is altered.  Brief patient description:  61 y/o M found with AMS, noted to have clogged BP shunt.  New shunt placed 7/25.  ICU course complicated by agitated delirium.   Events Since Admission: 7/25 -  Admit with clogged VP shunt, old shunt removed, new placed 7/26 -  Agitated delirium   Current Status: agitated, confused, combative with staff  Vital Signs: Temp:  [98.5 F (36.9 C)-100.1 F (37.8 C)] 98.5 F (36.9 C) (07/26 0600) Pulse Rate:  [102-138] 111  (07/26 1200) Resp:  [12-29] 16  (07/26 1200) BP: (90-126)/(56-75) 120/71 mmHg (07/26 1200) SpO2:  [97 %-100 %] 99 % (07/26 1200) Weight:  [166 lb 14.2 oz (75.7 kg)-168 lb 3.4 oz (76.3 kg)] 168 lb 3.4 oz (76.3 kg) (07/26 0600)  Physical Examination: General:  wdwn adult male in NAD Neuro:  Agitated, pulling at lines,  combative, MAE HEENT:  Mm pink, dry Cardiovascular:  s1s2 rrr, no m/r/g, SR on monitor Lungs:  resp's even/non-labored, lungs bilaterally coarse Abdomen:  Round / soft, bsx4 active Musculoskeletal:  No acute deformities Skin:  Warm / dry  Principal Problem:  *Hydrocephalus Active Problems:  Delirium  Encephalopathy acute   ASSESSMENT AND PLAN  NEUROLOGIC  A:   Acute Encephalopathy Hydrocephalus Dysfunctional VP shunt --Revision 7/25  P:   -rec's per NSGY -haldol 4mg  x1 (appears to be working well) -precedex if no results with above   PULMONARY No results found for this basename: PHART:5,PCO2:5,PCO2ART:5,PO2ART:5,HCO3:5,O2SAT:5 in the last 168 hours Ventilator Settings:   CXR:  7/25 >>>Central venous congestion.  Right apical capping could represent pleural fluid ETT:   A:  No acute issues.  P:   -monitor respiratory status with AMS -NPO until mental status improves  CARDIOVASCULAR No results found for this basename: TROPONINI:5,LATICACIDVEN:5, O2SATVEN:5,PROBNP:5 in the last 168 hours ECG:   Lines:   A:  No acute issues P:  -tele  RENAL  Lab 10/17/11 0715  NA 146*  K 3.9  CL 106  CO2 20  BUN 15  CREATININE 1.40*  CALCIUM 9.5  MG --  PHOS --   Intake/Output      07/25 0701 - 07/26 0700 07/26 0701 - 07/27 0700   I.V. (mL/kg) 2221.3 (29.1) 300 (3.9)   IV Piggyback 50    Total Intake(mL/kg) 2271.3 (29.8) 300 (3.9)   Urine (mL/kg/hr) 1595 (0.9)  230 (0.4)   Total Output 1595 230   Net +676.3 +70         Foley:  7/25>>>  A:   Acute Renal Insufficiency-In setting of volume depletion / dehydration  P:   -monitor BMP  GASTROINTESTINAL  Lab 10/17/11 0715  AST 25  ALT 13  ALKPHOS 64  BILITOT 0.1*  PROT 8.3  ALBUMIN 4.5    A:   No acute issues P:   -monitor  HEMATOLOGIC  Lab 10/17/11 0715  HGB 14.7  HCT 42.4  PLT 218  INR 1.29  APTT --   A:   No acute issues P:  -monitor h/h, cbc  INFECTIOUS  Lab 10/17/11 0715    WBC 8.0  PROCALCITON --   Cultures: 7/25 CSF>>> 7/25 CSF gram stain>>>wbc present, predom mononuclear  Antibiotics: 7/25 Ancef>>>7/25 (surgery)  A:   R/O CSF infection  P:   -monitor cultures -no further abx for now  ENDOCRINE No results found for this basename: GLUCAP:5 in the last 168 hours A:    No known hx of DM -glucose controlled on BMP  P:   -monitor bmp   BEST PRACTICE / DISPOSITION Level of Care:  ICU Primary Service:  NSGY Consultants:  PCCM Code Status:  FULL Diet:  NPO DVT Px:  SCD's GI Px:  Not indicated Social / Family:  None available   Canary Brim, NP-C [place] [facility] Pgr: [phone] or [phone]    10/18/2011, 2:04 PM   Responded to haldol for being combative, will make precedex available.  Patient seen and examined, agree with above note.  I dictated the care and orders written for this patient under my direction.  Koren Bound, M.D. [phone]

## 2011-10-18 NOTE — Progress Notes (Signed)
Patient ID: Kurt Walton, male   DOB: Mar 27, 1950, 61 y.o.   MRN: [id number] Subjective: The patient is a bit more alert since placement of the shunt. He is less agitated..  Objective: Vital signs in last 24 hours: Temp:  [98.4 F (36.9 C)-100.1 F (37.8 C)] 98.5 F (36.9 C) (07/26 0600) Pulse Rate:  [95-138] 106  (07/26 0700) Resp:  [12-29] 13  (07/26 0700) BP: (90-131)/(55-73) 108/56 mmHg (07/26 0700) SpO2:  [97 %-100 %] 98 % (07/26 0700) Weight:  [75.7 kg (166 lb 14.2 oz)-76.3 kg (168 lb 3.4 oz)] 76.3 kg (168 lb 3.4 oz) (07/26 0600)  Intake/Output from previous day: 07/25 0701 - 07/26 0700 In: 2196.3 [I.V.:2146.3; IV Piggyback:50] Out: 1595 [Urine:1595] Intake/Output this shift:    Physical exam: Glasgow Coma Scale 10 (E3M5V2) his pupils are equal. He is moving all 4 extremities well. His dressing is clean and dry.  Lab Results:  [facility] 10/17/11 0715  WBC 8.0  HGB 14.7  HCT 42.4  PLT 218   BMET  Basename 10/17/11 0715  NA 146*  K 3.9  CL 106  CO2 20  GLUCOSE 115*  BUN 15  CREATININE 1.40*  CALCIUM 9.5    Studies/Results: Dg Skull 1-3 Views  10/17/2011  **ADDENDUM** CREATED: 10/17/2011 10:03:42  Findings discussed Dr. Lorenso Courier on 10/17/2011 at 1005 hours as well as with the neurosurgeon Dr. Lovell Sheehan.  **END ADDENDUM** SIGNED BY: Genevive Bi, M.D.   10/17/2011  *RADIOLOGY REPORT*  Clinical Data: Altered mental status.  SKULL - 1-3 VIEW  Comparison: None.  Findings: The patient was combative and required five attendants to stabilize the patient for imaging.  The ventriculostomy catheter is not imaged in full. The tip right lateral ventricle. The catheter extends into the central canal at the level of the foramen magnum as seen on comparison CT. There is a branching or fracture of the catheter at the level of the foramen magnum.  There is a second fracture of the catheter approximate 6 cm from the ventriculostomy catheter tip in the fourth ventricle.  IMPRESSION: 1.   Ventriculostomy catheter is fractured 6 cm from the tip in the fourth ventricle. 2.  Ventriculostomy catheters is fractured and pinched at the level the foramen magnum. 3.  Catheter enters the central canal at the level of the foramen magnum as seen on comparison CT. Original Report Authenticated By: Genevive Bi, M.D.   Dg Chest 1 View  10/17/2011  *RADIOLOGY REPORT*  Clinical Data: Altered mental status  CHEST - 1 VIEW  Comparison: none  Findings: Normal cardiac silhouette.  There is apical capping on the right which may represent pleural fluid.  Mild central venous congestion.  No pulmonary edema or consolidation.  IMPRESSION:  1.  Central venous congestion.  2.  Right apical capping could represent pleural fluid.  Original Report Authenticated By: Genevive Bi, M.D.   Dg Abd 1 View  10/17/2011  *RADIOLOGY REPORT*  Clinical Data: Altered mental status  ABDOMEN - 1 VIEW  Comparison: None.  Findings: No dilated loops of large or small bowel.  No pathologic calcifications.  Scoliosis of the spine is noted.  IMPRESSION: No acute abdominal abnormality.  Original Report Authenticated By: Genevive Bi, M.D.   Ct Head Wo Contrast  10/18/2011  *RADIOLOGY REPORT*  Clinical Data: Ventricular shunt revision.  CT HEAD WITHOUT CONTRAST  Technique:  Contiguous axial images were obtained from the base of the skull through the vertex without contrast.  Comparison: Head CT 10/17/2011.  Findings: There is  a new ventriculostomy catheter entering via the right parietal bone and extending into the body of the left lateral ventricle.  The preexisting fractured catheter is unchanged.  There is stable diffuse cerebellar atrophy and diffuse dilatation of the ventricular system.  No acute intracranial hemorrhage, mass lesion or extra-axial fluid collection is seen.  There is a small amount of extra-axial air on the right related to the ventriculostomy catheter.  There is stable cystic expansion of the occipital bone  surrounding the preexisting ventricular catheter.  No acute osseous findings are seen.  IMPRESSION: Interval revision of ventriculostomy as described.  No demonstrated complication.  Ventricle size is unchanged.  Original Report Authenticated By: Gerrianne Scale, M.D.   Ct Head Wo Contrast  10/17/2011  *RADIOLOGY REPORT*  Clinical Data: Altered mental status.  The patient found lying in yard.  Confusion,  CT HEAD WITHOUT CONTRAST  Technique:  Contiguous axial images were obtained from the base of the skull through the vertex without contrast.  Comparison: None.  Findings: There is a ventriculostomy catheter with the tip in the atrium of the right lateral ventricle.  This catheter extends through the occipital bone and reenters the occipital bone and extends into the central canal level of the foramen magnum.  There chronic cystic expansion along the catheter tract within the occipital bone.  There  moderate hydrocephalus with dilatation of the lateral ventricles, temporal horns, third ventricle and fourth ventricle. The fourth ventricle  is significantly dilated.  There is no evidence of cortical infarction.  No intracranial hemorrhage.  No evidence of skull base fracture.  There is no fluid in the paranasal sinuses or mastoid air cells.  IMPRESSION:  1.  Moderate hydrocephalus with marked expansion of the fourth ventricle in patient with ventriculostomy catheter. No comparison available.  2.  Right ventriculostomy catheter re-enters the central canal at the level of the foramen magnum.  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  Original Report Authenticated By: Genevive Bi, M.D.    Assessment/Plan: Postop day 1: The patient has improved a bit since surgery. I don't know his neurologic baseline is. There is no family members available. His followup head CT demonstrates good position of the VP shunt. Continue supportive care.  LOS: 1 day     Hania Cerone D 10/18/2011, 7:16  AM

## 2011-10-18 NOTE — Progress Notes (Signed)
Patient has been alert,confused but calm x 3 hours. RN called to room by family members due to agitation. Patient is extremely agitated and combative with RN bedside. MD notified that 1 mg Ativan was given for agitation with no results. Additional 1 mg Ativan ordered and given. Patient remains combative. MD again notified. CCM was consulted for management of delirium. Haldol 4 mg ordered and given. Patient now resting comfortably. Will continue to monitor.

## 2011-10-18 NOTE — Progress Notes (Signed)
Foley catheter removed at 1100. No void by 1730. Bladder scan reads 355 ml in bladder. Straight cath performed. 300 ml out. Will continue to monitor.

## 2011-10-19 MED ORDER — SODIUM CHLORIDE 0.9 % IV BOLUS (SEPSIS)
500.0000 mL | Freq: Once | INTRAVENOUS | Status: AC
  Administered 2011-10-19: 500 mL via INTRAVENOUS

## 2011-10-19 MED ORDER — SODIUM CHLORIDE 0.9 % IV BOLUS (SEPSIS)
1000.0000 mL | Freq: Once | INTRAVENOUS | Status: AC
  Administered 2011-10-19: 1000 mL via INTRAVENOUS

## 2011-10-19 MED ORDER — WHITE PETROLATUM GEL
Status: AC
  Administered 2011-10-19: 06:00:00
  Filled 2011-10-19: qty 5

## 2011-10-19 MED ORDER — LORAZEPAM 2 MG/ML IJ SOLN: 1.0000 mg | Freq: Four times a day (QID) | INTRAMUSCULAR | Status: DC | PRN

## 2011-10-19 MED ORDER — THIAMINE HCL 100 MG/ML IJ SOLN
100.0000 mg | Freq: Every day | INTRAMUSCULAR | Status: DC
  Administered 2011-10-19 – 2011-10-24 (×6): 100 mg via INTRAVENOUS
  Filled 2011-10-19 (×6): qty 1

## 2011-10-19 MED ORDER — DEXTROSE-NACL 5-0.45 % IV SOLN
INTRAVENOUS | Status: DC
  Administered 2011-10-19: 09:00:00 via INTRAVENOUS

## 2011-10-19 MED ORDER — LORAZEPAM 2 MG/ML IJ SOLN
0.5000 mg | INTRAMUSCULAR | Status: DC
  Administered 2011-10-19 – 2011-10-20 (×3): 0.5 mg via INTRAVENOUS
  Filled 2011-10-19 (×3): qty 1

## 2011-10-19 MED ORDER — DEXMEDETOMIDINE HCL IN NACL 200 MCG/50ML IV SOLN
0.2000 ug/kg/h | INTRAVENOUS | Status: AC
  Filled 2011-10-19: qty 50

## 2011-10-19 MED ORDER — FOLIC ACID 5 MG/ML IJ SOLN
1.0000 mg | Freq: Every day | INTRAMUSCULAR | Status: DC
  Administered 2011-10-19 – 2011-10-24 (×5): 1 mg via INTRAVENOUS
  Filled 2011-10-19 (×7): qty 0.2

## 2011-10-19 NOTE — Progress Notes (Signed)
Patient woke -up very confused , agitated and combative. Precedex drip at 0.62mcq was not effective with his abrupt behavior. 4 mg .Haldol IV given . Emotional support offered, patient repositioned for comfort. Continued precedex drip . Will monitor closely, sitter at bedside.

## 2011-10-19 NOTE — Progress Notes (Signed)
Name: Kurt Walton MRN: [id number] DOB: 03-31-1950    LOS: 2  Referring Provider:  Dr. Lovell Sheehan Reason for Referral:  Agitated Delirium    PULMONARY / CRITICAL CARE MEDICINE  Brief patient description:  61 y/o M found with AMS, noted to have clogged BP shunt.  New shunt placed 7/25.  ICU course complicated by agitated delirium.   Events Since Admission: 7/25 -  Admit with clogged VP shunt, old shunt removed, new placed 7/26 -  Agitated delirium   Current Status: agitated, confused, combative with staff  Vital Signs: Temp:  [99.1 F (37.3 C)-99.6 F (37.6 C)] 99.6 F (37.6 C) (07/27 0100) Pulse Rate:  [73-129] 129  (07/27 0700) Resp:  [12-19] 16  (07/27 0600) BP: (90-126)/(41-75) 110/58 mmHg (07/27 0700) SpO2:  [96 %-100 %] 100 % (07/27 0700) Room air  Physical Examination: General:   adult male confused and agitated Neuro:  Agitated, pulling at lines, combative, MAE, shunt site unremarkable  HEENT:  Mm pink, dry Cardiovascular:  s1s2 rrr, no m/r/g, SR on monitor Lungs:  resp's even/non-labored, lungs bilaterally coarse Abdomen:  Round / soft, bsx4 active Musculoskeletal:  No acute deformities Skin:  Warm / dry  Principal Problem:  *Hydrocephalus Active Problems:  Delirium  Encephalopathy acute  Altered mental status  HTN (hypertension)   ASSESSMENT AND PLAN  NEUROLOGIC  A:   Acute Encephalopathy: likely metabolic etiology, ? W/d but family denies any ETOH Hydrocephalus Dysfunctional VP shunt --Revision 7/25  P:   -rec's per NSGY -cont precedex gtt.  -add thiamine and folate.  -add low dose benzo scheduled  PULMONARY  CXR:  7/25 >>>Central venous congestion.  Right apical capping could represent pleural fluid  A:  No acute issues.  P:   -monitor respiratory status with AMS -NPO until mental status improves  CARDIOVASCULAR  A:  Sinus tachycardia P:  -tele  RENAL  Lab 10/17/11 0715  NA 146*  K 3.9  CL 106  CO2 20  BUN 15    CREATININE 1.40*  CALCIUM 9.5  MG --  PHOS --   Intake/Output      07/26 0701 - 07/27 0700 07/27 0701 - 07/28 0700   I.V. (mL/kg) 1725 (22.6)    IV Piggyback     Total Intake(mL/kg) 1725 (22.6)    Urine (mL/kg/hr) 1380 (0.8)    Total Output 1380    Net +345          Foley:  7/25>>>  A:   Acute Renal Insufficiency-In setting of volume depletion / dehydration Hypernatremia P:   -change IVF D51/2 -f/u chem in am   GASTROINTESTINAL  Lab 10/17/11 0715  AST 25  ALT 13  ALKPHOS 64  BILITOT 0.1*  PROT 8.3  ALBUMIN 4.5    A:   Nutrition P:   -monitor -NPO until mental status improved  HEMATOLOGIC  Lab 10/17/11 0715  HGB 14.7  HCT 42.4  PLT 218  INR 1.29  APTT --   A:   No acute issues P:  -monitor cbc  INFECTIOUS  Lab 10/17/11 0715  WBC 8.0  PROCALCITON --   Cultures: 7/25 CSF>>>neg 7/25 CSF gram stain>>>wbc present, predom mononuclear  Antibiotics: 7/25 Ancef>>>7/25 (surgery)  A:   R/O CSF infection  P:   -monitor cultures -no further abx for now  ENDOCRINE  A:    No known hx of DM -glucose controlled on BMP  P:   -monitor bmp   BEST PRACTICE / DISPOSITION Level of Care:  ICU  Primary Service:  NSGY Consultants:  PCCM Code Status:  FULL Diet:  NPO DVT Px:  SCD's GI Px:  Not indicated Social / Family:  None available  Reviewed above, examined pt, and agree with assessment/plan.  Will add low dose, schedule ativan and continue precedex with prn haldol.  Critical care time 35 minutes.  Coralyn Helling, MD [facility] Pulmonary/Critical Care 10/19/2011, 8:53 AM Pager:  [phone] After 3pm call: [phone]

## 2011-10-19 NOTE — Progress Notes (Signed)
Subjective: Patient resting in bed, continues to have periods of agitation and combativeness, continues to remain confused. Has required restraints because of combativeness.  Objective: Vital signs in last 24 hours: Filed Vitals:   10/19/11 0400 10/19/11 0500 10/19/11 0600 10/19/11 0700  BP: 100/46 99/49 110/51 110/58  Pulse: 82 92 99 129  Temp:      TempSrc:      Resp: 14 15 16    Height:      Weight:      SpO2: 97% 98% 99% 100%    Intake/Output from previous day: 07/26 0701 - 07/27 0700 In: 1725 [I.V.:1725] Out: 1380 [Urine:1380] Intake/Output this shift:    Physical Exam:  Patient awake , speech confused, patient not oriented, not answering questions, not following commands. Moving all 4 extremities. Dressing clean and dry.  CBC  Basename 10/17/11 0715  WBC 8.0  HGB 14.7  HCT 42.4  PLT 218   BMET  Basename 10/17/11 0715  NA 146*  K 3.9  CL 106  CO2 20  GLUCOSE 115*  BUN 15  CREATININE 1.40*  CALCIUM 9.5    Studies/Results: Dg Skull 1-3 Views  10/17/2011  **ADDENDUM** CREATED: 10/17/2011 10:03:42  Findings discussed Dr. Lorenso Courier on 10/17/2011 at 1005 hours as well as with the neurosurgeon Dr. Lovell Sheehan.  **END ADDENDUM** SIGNED BY: Genevive Bi, M.D.   10/17/2011  *RADIOLOGY REPORT*  Clinical Data: Altered mental status.  SKULL - 1-3 VIEW  Comparison: None.  Findings: The patient was combative and required five attendants to stabilize the patient for imaging.  The ventriculostomy catheter is not imaged in full. The tip right lateral ventricle. The catheter extends into the central canal at the level of the foramen magnum as seen on comparison CT. There is a branching or fracture of the catheter at the level of the foramen magnum.  There is a second fracture of the catheter approximate 6 cm from the ventriculostomy catheter tip in the fourth ventricle.  IMPRESSION: 1.  Ventriculostomy catheter is fractured 6 cm from the tip in the fourth ventricle. 2.  Ventriculostomy  catheters is fractured and pinched at the level the foramen magnum. 3.  Catheter enters the central canal at the level of the foramen magnum as seen on comparison CT. Original Report Authenticated By: Genevive Bi, M.D.   Dg Chest 1 View  10/17/2011  *RADIOLOGY REPORT*  Clinical Data: Altered mental status  CHEST - 1 VIEW  Comparison: none  Findings: Normal cardiac silhouette.  There is apical capping on the right which may represent pleural fluid.  Mild central venous congestion.  No pulmonary edema or consolidation.  IMPRESSION:  1.  Central venous congestion.  2.  Right apical capping could represent pleural fluid.  Original Report Authenticated By: Genevive Bi, M.D.   Dg Abd 1 View  10/17/2011  *RADIOLOGY REPORT*  Clinical Data: Altered mental status  ABDOMEN - 1 VIEW  Comparison: None.  Findings: No dilated loops of large or small bowel.  No pathologic calcifications.  Scoliosis of the spine is noted.  IMPRESSION: No acute abdominal abnormality.  Original Report Authenticated By: Genevive Bi, M.D.   Ct Head Wo Contrast  10/18/2011  *RADIOLOGY REPORT*  Clinical Data: Ventricular shunt revision.  CT HEAD WITHOUT CONTRAST  Technique:  Contiguous axial images were obtained from the base of the skull through the vertex without contrast.  Comparison: Head CT 10/17/2011.  Findings: There is a new ventriculostomy catheter entering via the right parietal bone and extending into the body  of the left lateral ventricle.  The preexisting fractured catheter is unchanged.  There is stable diffuse cerebellar atrophy and diffuse dilatation of the ventricular system.  No acute intracranial hemorrhage, mass lesion or extra-axial fluid collection is seen.  There is a small amount of extra-axial air on the right related to the ventriculostomy catheter.  There is stable cystic expansion of the occipital bone surrounding the preexisting ventricular catheter.  No acute osseous findings are seen.  IMPRESSION:  Interval revision of ventriculostomy as described.  No demonstrated complication.  Ventricle size is unchanged.  Original Report Authenticated By: Gerrianne Scale, M.D.   Ct Head Wo Contrast  10/17/2011  *RADIOLOGY REPORT*  Clinical Data: Altered mental status.  The patient found lying in yard.  Confusion,  CT HEAD WITHOUT CONTRAST  Technique:  Contiguous axial images were obtained from the base of the skull through the vertex without contrast.  Comparison: None.  Findings: There is a ventriculostomy catheter with the tip in the atrium of the right lateral ventricle.  This catheter extends through the occipital bone and reenters the occipital bone and extends into the central canal level of the foramen magnum.  There chronic cystic expansion along the catheter tract within the occipital bone.  There  moderate hydrocephalus with dilatation of the lateral ventricles, temporal horns, third ventricle and fourth ventricle. The fourth ventricle  is significantly dilated.  There is no evidence of cortical infarction.  No intracranial hemorrhage.  No evidence of skull base fracture.  There is no fluid in the paranasal sinuses or mastoid air cells.  IMPRESSION:  1.  Moderate hydrocephalus with marked expansion of the fourth ventricle in patient with ventriculostomy catheter. No comparison available.  2.  Right ventriculostomy catheter re-enters the central canal at the level of the foramen magnum.  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  Original Report Authenticated By: Genevive Bi, M.D.    Assessment/Plan: We'll recheck CT brain without contrast and BMET on morning of 10/21/2011.   Hewitt Shorts, MD 10/19/2011, 7:58 AM

## 2011-10-20 ENCOUNTER — Encounter (HOSPITAL_COMMUNITY): Payer: Self-pay | Admitting: Neurology

## 2011-10-20 DIAGNOSIS — E870 Hyperosmolality and hypernatremia: Secondary | ICD-10-CM | POA: Diagnosis not present

## 2011-10-20 DIAGNOSIS — R34 Anuria and oliguria: Secondary | ICD-10-CM | POA: Diagnosis not present

## 2011-10-20 LAB — CBC
HCT: 33.8 % — ABNORMAL LOW (ref 39.0–52.0)
Hemoglobin: 11.4 g/dL — ABNORMAL LOW (ref 13.0–17.0)
MCH: 29.3 pg (ref 26.0–34.0)
MCV: 86.9 fL (ref 78.0–100.0)
Platelets: 162 10*3/uL (ref 150–400)
RBC: 3.89 MIL/uL — ABNORMAL LOW (ref 4.22–5.81)
WBC: 4.1 10*3/uL (ref 4.0–10.5)

## 2011-10-20 LAB — BASIC METABOLIC PANEL
CO2: 25 mEq/L (ref 19–32)
CO2: 25 mEq/L (ref 19–32)
Calcium: 8.4 mg/dL (ref 8.4–10.5)
Calcium: 8.3 mg/dL — ABNORMAL LOW (ref 8.4–10.5)
Chloride: 118 mEq/L — ABNORMAL HIGH (ref 96–112)
Creatinine, Ser: 0.95 mg/dL (ref 0.50–1.35)
Glucose, Bld: 97 mg/dL (ref 70–99)
Glucose, Bld: 103 mg/dL — ABNORMAL HIGH (ref 70–99)
Potassium: 2.9 mEq/L — ABNORMAL LOW (ref 3.5–5.1)
Sodium: 145 mEq/L (ref 135–145)

## 2011-10-20 MED ORDER — DEXTROSE 5 % IV SOLN
INTRAVENOUS | Status: DC
  Administered 2011-10-20 (×2): via INTRAVENOUS

## 2011-10-20 MED ORDER — LORAZEPAM 2 MG/ML IJ SOLN
1.0000 mg | INTRAMUSCULAR | Status: DC | PRN
  Administered 2011-10-20 – 2011-10-22 (×5): 1 mg via INTRAVENOUS
  Filled 2011-10-20 (×5): qty 1

## 2011-10-20 MED ORDER — POTASSIUM CHLORIDE 10 MEQ/100ML IV SOLN
10.0000 meq | INTRAVENOUS | Status: AC
  Administered 2011-10-20 – 2011-10-21 (×6): 10 meq via INTRAVENOUS
  Filled 2011-10-20: qty 500
  Filled 2011-10-20: qty 100

## 2011-10-20 NOTE — Progress Notes (Signed)
Patient ID: Kurt Walton, male   DOB: Aug 18, 1950, 61 y.o.   MRN: [id number] Alert and oriented to person Following some commands Pupils equal, round, and reactive Moving all extremities Wounds are clean, dry and without signs of infection Speech is slurred difficult to understand Improving neurologically

## 2011-10-20 NOTE — Progress Notes (Signed)
Name: Kurt Walton MRN: [id number] DOB: Dec 11, 1950    LOS: 3  Referring Provider:  Dr. Lovell Sheehan Reason for Referral:  Agitated Delirium    PULMONARY / CRITICAL CARE MEDICINE  Brief patient description:  62 y/o M found with AMS, noted to have clogged BP shunt.  New shunt placed 7/25.  ICU course complicated by agitated delirium.   Events Since Admission: 7/25 -  Admit with clogged VP shunt, old shunt removed, new placed 7/26 -  Agitated delirium  Current Status:  Received ativan once overnight.  More calm this morning, but still very confused.  Vital Signs: Temp:  [98.2 F (36.8 C)-99 F (37.2 C)] 99 F (37.2 C) (07/28 0400) Pulse Rate:  [54-96] 57  (07/28 0700) Resp:  [11-19] 11  (07/28 0700) BP: (83-115)/(45-73) 93/63 mmHg (07/28 0700) SpO2:  [95 %-100 %] 99 % (07/28 0700) Weight:  [176 lb 12.9 oz (80.2 kg)] 176 lb 12.9 oz (80.2 kg) (07/28 0600) Room air   Physical Examination: General:   adult male confused Neuro:  shunt site unremarkable  HEENT:  Mm pink, dry Cardiovascular:  s1s2 rrr, no m/r/g, SR on monitor Lungs:  resp's even/non-labored, lungs bilaterally coarse Abdomen:  Round / soft, bsx4 active Musculoskeletal:  No acute deformities Skin:  Warm / dry  Principal Problem:  *Hydrocephalus Active Problems:  Delirium  Encephalopathy acute  Altered mental status  HTN (hypertension)   ASSESSMENT AND PLAN  NEUROLOGIC  A:   Acute Encephalopathy: likely metabolic etiology, ? W/d but family denies any ETOH Hydrocephalus Dysfunctional VP shunt --Revision 7/25  P:   -rec's per NSGY -cont precedex gtt.  -add thiamine and folate.  -change benzo's to prn only -f/u CT head 7/29  PULMONARY  CXR:  7/25 >>>Central venous congestion.  Right apical capping could represent pleural fluid  A:  No acute issues.  P:   -monitor respiratory status with AMS -NPO until mental status improves  CARDIOVASCULAR  A:  Sinus tachycardia P:  -tele  RENAL  Lab  10/20/11 0445 10/17/11 0715  NA 151* 146*  K 3.3* 3.9  CL 118* 106  CO2 25 20  BUN 18 15  CREATININE 0.95 1.40*  CALCIUM 8.4 9.5  MG -- --  PHOS -- --   Intake/Output      07/27 0701 - 07/28 0700 07/28 0701 - 07/29 0700   I.V. (mL/kg) 1390.3 (17.3)    Total Intake(mL/kg) 1390.3 (17.3)    Urine (mL/kg/hr) 700 (0.4)    Total Output 700    Net +690.3          Foley:  7/25>>>  A:   Acute Renal Insufficiency-In setting of volume depletion / dehydration Hypernatremia P:   -change IVF D5W -f/u chem in am   GASTROINTESTINAL  Lab 10/17/11 0715  AST 25  ALT 13  ALKPHOS 64  BILITOT 0.1*  PROT 8.3  ALBUMIN 4.5    A:   Nutrition P:   -monitor -NPO until mental status improved  HEMATOLOGIC  Lab 10/20/11 0445 10/17/11 0715  HGB 11.4* 14.7  HCT 33.8* 42.4  PLT 162 218  INR -- 1.29  APTT -- --   A:   No acute issues P:  -monitor cbc  INFECTIOUS  Lab 10/20/11 0445 10/17/11 0715  WBC 4.1 8.0  PROCALCITON -- --   Cultures: 7/25 CSF>>>neg 7/25 CSF gram stain>>>wbc present, predom mononuclear  Antibiotics: 7/25 Ancef>>>7/25 (surgery)  A:   R/O CSF infection  P:   -monitor cultures -no further  abx for now  ENDOCRINE  A:    No known hx of DM -glucose controlled on BMP  P:   -monitor bmp   BEST PRACTICE / DISPOSITION Level of Care:  ICU Primary Service:  NSGY Consultants:  PCCM Code Status:  FULL Diet:  NPO DVT Px:  SCD's GI Px:  Protonix Social / Family:  None available   Coralyn Helling, MD [facility] Pulmonary/Critical Care 10/20/2011, 7:19 AM Pager:  [phone] After 3pm call: [phone]

## 2011-10-21 ENCOUNTER — Inpatient Hospital Stay (HOSPITAL_COMMUNITY): Payer: Medicaid Other

## 2011-10-21 ENCOUNTER — Encounter (HOSPITAL_COMMUNITY): Payer: Self-pay | Admitting: Radiology

## 2011-10-21 DIAGNOSIS — R404 Transient alteration of awareness: Secondary | ICD-10-CM

## 2011-10-21 DIAGNOSIS — I10 Essential (primary) hypertension: Secondary | ICD-10-CM

## 2011-10-21 DIAGNOSIS — IMO0002 Reserved for concepts with insufficient information to code with codable children: Secondary | ICD-10-CM

## 2011-10-21 LAB — CSF CULTURE W GRAM STAIN

## 2011-10-21 LAB — BASIC METABOLIC PANEL
CO2: 23 mEq/L (ref 19–32)
Calcium: 8.5 mg/dL (ref 8.4–10.5)
Chloride: 109 mEq/L (ref 96–112)
Creatinine, Ser: 0.85 mg/dL (ref 0.50–1.35)
GFR calc Af Amer: >90 mL/min (ref >90)
GFR calc non Af Amer: >90 mL/min (ref >90)
Glucose, Bld: 101 mg/dL — ABNORMAL HIGH (ref 70–99)
Potassium: 3 mEq/L — ABNORMAL LOW (ref 3.5–5.1)

## 2011-10-21 MED ORDER — RISPERIDONE 1 MG PO TABS
1.0000 mg | ORAL_TABLET | Freq: Two times a day (BID) | ORAL | Status: DC
  Administered 2011-10-21 – 2011-10-22 (×2): 1 mg via ORAL
  Filled 2011-10-21 (×4): qty 1

## 2011-10-21 NOTE — Progress Notes (Signed)
eLink Physician-Brief Progress Note Patient Name: Kurt Walton DOB: May 31, 1950 MRN: [id number]  Date of Service  10/21/2011   HPI/Events of Note   Pt unable to safely take his Po risperdol this pm. There is no good IV substitute beyond haldol, no indication here for depo dosing (IM dose). Will hold the risperidol and follow, give haldol prn agitation.   eICU Interventions     Intervention Category Minor Interventions: Routine modifications to care plan (e.g. PRN medications for pain, fever)  Timber Lucarelli S. 10/21/2011, 11:26 PM

## 2011-10-21 NOTE — Progress Notes (Signed)
Patient ID: Kurt Walton, male   DOB: 1950/09/25, 61 y.o.   MRN: [id number] Subjective:  The patient is somnolent but arousable. He continues to be agitated and at times combative. The cords the patient's family he has occasionally done this in the past.  Objective: Vital signs in last 24 hours: Temp:  [98.8 F (37.1 C)-100 F (37.8 C)] 99.1 F (37.3 C) (07/29 0410) Pulse Rate:  [63-166] 65  (07/29 0700) Resp:  [15-24] 16  (07/29 0700) BP: (104-164)/(62-111) 117/69 mmHg (07/29 0700) SpO2:  [54 %-100 %] 100 % (07/29 0700)  Intake/Output from previous day: 07/28 0701 - 07/29 0700 In: 2096.7 [I.V.:2096.7] Out: 1265 [Urine:1265] Intake/Output this shift:    Physical exam SGOT, scale 10 (E3M5V2), his pupils are equal, he is moving all 4 extremities well. He mumbles.  Lab Results:  [facility] 10/20/11 0445  WBC 4.1  HGB 11.4*  HCT 33.8*  PLT 162   BMET  Basename 10/20/11 1638 10/20/11 0445  NA 145 151*  K 2.9* 3.3*  CL 111 118*  CO2 25 25  GLUCOSE 103* 97  BUN 14 18  CREATININE 0.84 0.95  CALCIUM 8.3* 8.4    Studies/Results: No results found.  Assessment/Plan: Postop day #4: The patient's scan this morning demonstrates good position of the ventriculoperitoneal shunt. I am suspecting that hydrocephalus was not his problem as he has a functioning shunt now and has not changed clinically. I will asked the neurologist to see him for input.    LOS: 4 days     Saveah Bahar D 10/21/2011, 7:47 AM

## 2011-10-21 NOTE — Progress Notes (Signed)
Name: Kurt Walton MRN: [id number] DOB: [birth year]    LOS: 4  Referring Provider:  Dr. Lovell Sheehan Reason for Referral:  Agitated Delirium    PULMONARY / CRITICAL CARE MEDICINE  Brief patient description:  61 y/o M found with AMS, noted to have clogged BP shunt.  New shunt placed 7/25.  ICU course complicated by agitated delirium.   Events Since Admission: 7/25 -  Admit with clogged VP shunt, old shunt removed, new placed 7/26 -  Agitated delirium 7/28-precedex  Current Status:  Off precedex brady  Vital Signs: Temp:  [98.8 F (37.1 C)-100 F (37.8 C)] 98.8 F (37.1 C) (07/29 0700) Pulse Rate:  [64-166] 65  (07/29 0900) Resp:  [15-24] 15  (07/29 0900) BP: (104-164)/(62-111) 129/69 mmHg (07/29 0900) SpO2:  [54 %-100 %] 100 % (07/29 0900) Room air   Physical Examination: General:   adult male confused Neuro:  shunt site unremarkable  HEENT:  Mm pink, dry Cardiovascular:  s1s2 rrr, no m/r/g, SR on monitor Lungs: cta anterior Abdomen:  Round / soft, bsx4 active Musculoskeletal:  No acute deformities Skin:  Warm / dry  Principal Problem:  *Hydrocephalus Active Problems:  Delirium  Encephalopathy acute  Altered mental status  HTN (hypertension)  Hypernatremia  Oliguria   ASSESSMENT AND PLAN  NEUROLOGIC  A:   Acute Encephalopathy: likely metabolic etiology, ? W/d but family denies any ETOH Hydrocephalus Dysfunctional VP shunt --Revision 7/25  P:   -rec's per NSGY -off precedex gtt from hemodynamic issues -add thiamine and folate.  -change benzo's to prn only, avoid with dilerium -f/u CT head 7/29, done Risperdal  1 mg q12h oral when able Consider scheduled haldol in place of ativan qtc assessment  PULMONARY  CXR:  7/25 >>>Central venous congestion.  Right apical capping could represent pleural fluid  A:  No acute issues.  P:   -NPO until mental status improves  CARDIOVASCULAR  A:  Sinus tachycardia P:  -tele  RENAL  Lab 10/20/11 1638  10/20/11 0445 10/17/11 0715  NA 145 151* 146*  K 2.9* 3.3* --  CL 111 118* 106  CO2 25 25 20   BUN 14 18 15   CREATININE 0.84 0.95 1.40*  CALCIUM 8.3* 8.4 9.5  MG -- -- --  PHOS -- -- --   Intake/Output      07/28 0701 - 07/29 0700 07/29 0701 - 07/30 0700   I.V. (mL/kg) 2096.7 (26.1) 200 (2.5)   Total Intake(mL/kg) 2096.7 (26.1) 200 (2.5)   Urine (mL/kg/hr) 1265 (0.7)    Total Output 1265    Net +831.7 +200         Foley:  7/25>>>  A:   Acute Renal Insufficiency-In setting of volume depletion / dehydration Hypernatremia P:   -change IVF D5W, slow and  Follow na further -f/u chem in am   GASTROINTESTINAL  Lab 10/17/11 0715  AST 25  ALT 13  ALKPHOS 64  BILITOT 0.1*  PROT 8.3  ALBUMIN 4.5    A:   Nutrition P:   -monitor -NPO until mental status improved Need to limit benzo and add Risperdal/ haldol to increase alertness  HEMATOLOGIC  Lab 10/20/11 0445 10/17/11 0715  HGB 11.4* 14.7  HCT 33.8* 42.4  PLT 162 218  INR -- 1.29  APTT -- --   A:   DVt prevention P:  -scd  INFECTIOUS  Lab 10/20/11 0445 10/17/11 0715  WBC 4.1 8.0  PROCALCITON -- --   Cultures: 7/25 CSF>>>neg 7/25 CSF gram stain>>>wbc present, predom  mononuclear  Antibiotics: 7/25 Ancef>>>7/25 (surgery)  A:   R/O CSF infection  P:   -monitor cultures -no further abx for now  ENDOCRINE  A:    No known hx of DM -glucose controlled on BMP  P:   -monitor bmp   BEST PRACTICE / DISPOSITION Level of Care:  ICU Primary Service:  NSGY Consultants:  PCCM Code Status:  FULL Diet:  NPO DVT Px:  SCD's GI Px:  Protonix Social / Family:  None available   Mcarthur Rossetti. Tyson Alias, MD, FACP Pgr: [phone]  Pulmonary & Critical Care

## 2011-10-22 LAB — BASIC METABOLIC PANEL
Calcium: 8.5 mg/dL (ref 8.4–10.5)
GFR calc Af Amer: >90 mL/min (ref >90)
GFR calc non Af Amer: >90 mL/min (ref >90)
Potassium: 3.2 mEq/L — ABNORMAL LOW (ref 3.5–5.1)
Sodium: 142 mEq/L (ref 135–145)

## 2011-10-22 MED ORDER — DIPHENHYDRAMINE HCL 50 MG/ML IJ SOLN: 12.5000 mg | Freq: Four times a day (QID) | INTRAMUSCULAR | Status: DC | PRN

## 2011-10-22 MED ORDER — SODIUM CHLORIDE 0.9 % IJ SOLN: 9.0000 mL | INTRAMUSCULAR | Status: DC | PRN

## 2011-10-22 MED ORDER — RISPERIDONE 1 MG PO TABS
1.0000 mg | ORAL_TABLET | Freq: Two times a day (BID) | ORAL | Status: DC
  Administered 2011-10-22 – 2011-10-29 (×14): 1 mg via ORAL
  Filled 2011-10-22 (×17): qty 1

## 2011-10-22 MED ORDER — POTASSIUM CHLORIDE CRYS ER 20 MEQ PO TBCR
40.0000 meq | EXTENDED_RELEASE_TABLET | Freq: Two times a day (BID) | ORAL | Status: DC
  Administered 2011-10-22 – 2011-10-29 (×14): 40 meq via ORAL
  Filled 2011-10-22 (×16): qty 2

## 2011-10-22 MED ORDER — ONDANSETRON HCL 4 MG/2ML IJ SOLN: 4.0000 mg | Freq: Four times a day (QID) | INTRAMUSCULAR | Status: DC | PRN

## 2011-10-22 MED ORDER — DOCUSATE SODIUM 100 MG PO CAPS
100.0000 mg | ORAL_CAPSULE | Freq: Two times a day (BID) | ORAL | Status: DC
  Administered 2011-10-22 – 2011-10-28 (×12): 100 mg via ORAL
  Filled 2011-10-22 (×10): qty 1

## 2011-10-22 MED ORDER — DIPHENHYDRAMINE HCL 12.5 MG/5ML PO ELIX: 12.5000 mg | ORAL_SOLUTION | Freq: Four times a day (QID) | ORAL | Status: DC | PRN

## 2011-10-22 MED ORDER — PNEUMOCOCCAL VAC POLYVALENT 25 MCG/0.5ML IJ INJ
0.5000 mL | INJECTION | INTRAMUSCULAR | Status: AC
  Administered 2011-10-23: 0.5 mL via INTRAMUSCULAR
  Filled 2011-10-22: qty 0.5

## 2011-10-22 MED ORDER — NALOXONE HCL 0.4 MG/ML IJ SOLN: 0.4000 mg | INTRAMUSCULAR | Status: DC | PRN

## 2011-10-22 NOTE — Progress Notes (Signed)
Patient ID: Kurt Walton, male   DOB: 1950/12/27, 61 y.o.   MRN: [id number] Subjective:  The patient is more alert and pleasant today.  Objective: Vital signs in last 24 hours: Temp:  [98.3 F (36.8 C)-99.4 F (37.4 C)] 98.6 F (37 C) (07/30 0700) Pulse Rate:  [62-79] 64  (07/30 0700) Resp:  [14-21] 14  (07/30 0700) BP: (112-147)/(58-98) 142/82 mmHg (07/30 0700) SpO2:  [100 %] 100 % (07/30 0700) Weight:  [78 kg (171 lb 15.3 oz)] 78 kg (171 lb 15.3 oz) (07/30 0600)  Intake/Output from previous day: 07/29 0701 - 07/30 0700 In: 1380 [I.V.:1380] Out: 2425 [Urine:2425] Intake/Output this shift:    Physical exam the patient is alert and pleasant. He will answer simple questions. He is moving all 4 extremities well. His pupils are equal. His incisions are healing well.  Lab Results:  [facility] 10/20/11 0445  WBC 4.1  HGB 11.4*  HCT 33.8*  PLT 162   BMET  Basename 10/22/11 0340 10/21/11 0940  NA 142 141  K 3.2* 3.0*  CL 105 109  CO2 29 23  GLUCOSE 101* 101*  BUN 5* 7  CREATININE 0.90 0.85  CALCIUM 8.5 8.5    Studies/Results: Ct Head Wo Contrast  10/21/2011  *RADIOLOGY REPORT*  Clinical Data: Reevaluate ventricular size.  CT HEAD WITHOUT CONTRAST  Technique:  Contiguous axial images were obtained from the base of the skull through the vertex without contrast.  Comparison: CT head without contrast 10/18/2011.  Findings: A right parietal ventriculostomy catheter is stable in position.  The more posterior right parietal catheter is also stable.  Cerebellar atrophy and postsurgical changes in the posterior fossa are again noted.  The catheter extending within the subarachnoid space of the upper cervical spine is stable.  The ventricular size is stable.  Enlargement of the fourth ventricle is secondary to cerebellar atrophy.  Minimal pneumocephalus is near completely resolved.  No acute infarct, hemorrhage, or mass lesion is present.  There is no significant extra-axial fluid  collection.  The paranasal sinuses and mastoid air cells are clear.  Expansion of the posterior occipital skull around the shunt catheter is stable.  This may represent a contained leak, chronic.  IMPRESSION:  1.  Stable appearance of the ventricles following revision of a ventriculostomy catheter. 2.  No acute intracranial abnormality.  Original Report Authenticated By: Jamesetta Orleans. MATTERN, M.D.    Assessment/Plan: Postop day #5: Patient appears to be healing well.  Decreased mental status: I don't know whether this is from hydrocephalus and is improving as the shunt functions. Alternatively the patient may have some psychiatric issues. I've asked neurology for their input.  LOS: 5 days     Mkayla Steele D 10/22/2011, 7:46 AM

## 2011-10-22 NOTE — Consult Note (Signed)
[facility] CONSULT NOTE     Reason for Consult: Encephalopathy with confusion   HPI:    Kurt Walton is an 61 y.o. male was admitted on 10/17/2011 for confusion and combative. Patient was noted on CT scan to have hydrocephalus and a ventriculoperitoneal shunt, and the blockage of shot was a likely etiology for his encephalopathic state. Patient underwent ventriculoperitoneal shunt replacement on 10/18/2011. Postoperatively he had marked confusion and agitation. There was transient hypernatremia which has resolved. CSF showed no signs of infectious process. He has not been febrile. His mental status has improved. However he still significantly confused and intermittently agitated and has required a sitter for safety, in addition to chemical restraints with Haldol and Ativan when necessary. At one point he required physical restraints. Patient's baseline cognitive functioning is unknown, as there is no family available to give reliable history. It's unclear how long he is high of hydrocephalus. Most recent CT scan on 10/21/2011 showed stable appearance of ventricles following revision of ventriculostomy catheter. Marked cerebellar atrophy and fourth ventricle dilation are noted. There were no acute abnormalities.  History reviewed. No pertinent past medical history.  Past Surgical History  Procedure Date  . Ventriculoperitoneal shunt 10/17/2011    Procedure: SHUNT INSERTION VENTRICULAR-PERITONEAL;  Surgeon: Cristi Loron, MD;  Location: [facility];  Service: Neurosurgery;  Laterality: Right;  Instertion of Ventricular-Peritoneal Shunt    History reviewed. No pertinent family history.  Social History:  does not have a smoking history on file. He does not have any smokeless tobacco history on file. He reports that he drinks alcohol. His drug history not on file.  No Known Allergies  Medications:    Scheduled:   . antiseptic oral rinse  15 mL Mouth  Rinse q12n4p  . chlorhexidine  15 mL Mouth Rinse BID  . docusate sodium  100 mg Oral BID  . folic acid  1 mg Intravenous Daily  . pantoprazole (PROTONIX) IV  40 mg Intravenous QHS  . potassium chloride  40 mEq Oral BID  . risperiDONE  1 mg Oral BID  . thiamine  100 mg Intravenous Daily  . DISCONTD: docusate sodium  100 mg Oral BID  . DISCONTD: risperiDONE  1 mg Oral BID   ZOX:[id number], acetaminophen, diphenhydrAMINE, diphenhydrAMINE, haloperidol lactate, HYDROcodone-acetaminophen, labetalol, LORazepam, morphine injection, naloxone, ondansetron (ZOFRAN) IV, ondansetron (ZOFRAN) IV, promethazine, sodium chloride   Blood pressure 133/83, pulse 65, temperature 98.1 F (36.7 C), temperature source Oral, resp. rate 18, height 5\' 9"  (1.753 m), weight 79.9 kg (176 lb 2.4 oz), SpO2 94.00%.   Neurologic Examination:  Mental Status: Alert, oriented x3; tended to confabulate freely; poor insight into the extent of illness in deficits.  Speech slightly dysarthric without evidence of aphasia. Able to follow commands with minimal difficulty. Cranial Nerves: II-Visual fields were normal. III/IV/VI-Pupils were equal and reacted. Extraocular movements were full and conjugate; eye movements were slow and intermittent rather than smooth and continuous; there was no nystagmus.    V/VII-no facial numbness and no facial weakness. VIII-normal. X-slightly dysarthric speech; symmetrical palatal movement. Motor: Normal strength of right upper extremity proximally and distally as well as normal strength of lower extremities. Moderate deltoid weakness on the left with normal biceps and triceps strength; moderately severe left wrist drop. Sensory: Normal throughout to tactile stimulation. Deep Tendon Reflexes: 2+ and symmetric. Plantars: Flexor bilaterally Cerebellar: Moderate dyssynergia with  finger to nose testing bilaterally, left greater than right.   No results found for this basename: cbc, bmp, coags,  chol, tri, ldl, hga1c      Ct Head Wo Contrast  10/21/2011  *RADIOLOGY REPORT*  Clinical Data: Reevaluate ventricular size.  CT HEAD WITHOUT CONTRAST  Technique:  Contiguous axial images were obtained from the base of the skull through the vertex without contrast.  Comparison: CT head without contrast 10/18/2011.  Findings: A right parietal ventriculostomy catheter is stable in position.  The more posterior right parietal catheter is also stable.  Cerebellar atrophy and postsurgical changes in the posterior fossa are again noted.  The catheter extending within the subarachnoid space of the upper cervical spine is stable.  The ventricular size is stable.  Enlargement of the fourth ventricle is secondary to cerebellar atrophy.  Minimal pneumocephalus is near completely resolved.  No acute infarct, hemorrhage, or mass lesion is present.  There is no significant extra-axial fluid collection.  The paranasal sinuses and mastoid air cells are clear.  Expansion of the posterior occipital skull around the shunt catheter is stable.  This may represent a contained leak, chronic.  IMPRESSION:  1.  Stable appearance of the ventricles following revision of a ventriculostomy catheter. 2.  No acute intracranial abnormality.  Original Report Authenticated By: Jamesetta Orleans. MATTERN, M.D.    Assessment/Plan:  Status post ventriculoperitoneal shunt replacement with overall improved mental status compared to patient's level of confusion and dysfunction on admission. Patient still has significant confusion as well as coordination abnormalities. Patient's baseline cognitive as well as motor coordination and gait stability are unknown. Extent and duration of worsening hydrocephalus associated with shunt dysfunction is unknown. At this point it's clear that the patient is going to require 24-hour supervision.    Recommendations: 1. Consider switching from prn Haldol and Ativan to Seroquel starting at 25 mg twice a day as a  scheduled medication for control of agitation. 2. EEG to assess severity of encephalopathy. 3. Physical therapy and occupational therapy consults. 4. Case management involvement for likely placement in a skilled nursing facility.  Thank you for asking me to participate in Mr. Snellings's care. We will continue to follow him with you.  Venetia Maxon M.D. [facility] [phone]  10/22/2011, 10:54 AM

## 2011-10-22 NOTE — Care Management Note (Signed)
    Page 1 of 1   10/25/2011     5:43:40 PM   CARE MANAGEMENT NOTE 10/22/2011  Patient:  Kurt Walton, Kurt Walton   Account Number:  [id number]  Date Initiated:  10/18/2011  Documentation initiated by:  [facility]  Subjective/Objective Assessment:   Admitted postop VP shunt placement.     Action/Plan:   Anticipated DC Date:  10/21/2011   Anticipated DC Plan:  HOME W HOME HEALTH SERVICES  In-house referral  Clinical Social Worker  Artist      DC Planning Services  CM consult      Choice offered to / List presented to:             Status of service:  In process, will continue to follow Medicare Important Message given?   (If response is "NO", the following Medicare IM given date fields will be blank) Date Medicare IM given:   Date Additional Medicare IM given:    Discharge Disposition:    Per UR Regulation:  Reviewed for med. necessity/level of care/duration of stay  If discussed at Long Length of Stay Meetings, dates discussed:    Comments:  10/22/11 Patient moved out of ICU, still confused, sitter ordered, neuro eval recommending PT/OT evals and probable SNF for rehab. Contacted financial counselor regarding possible Medicaid. Referral to CSW. CM will continue to follow for d/c needs. Jacquelynn Cree RN, BSN, CCM

## 2011-10-22 NOTE — Progress Notes (Signed)
Name: Kurt Walton MRN: [id number] DOB: 07-Oct-1950    LOS: 5  Referring Provider:  Dr. Lovell Sheehan Reason for Referral:  Agitated Delirium   PULMONARY / CRITICAL CARE MEDICINE  Brief patient description:  61 y/o M found with AMS, noted to have clogged BP shunt.  New shunt placed 7/25.  ICU course complicated by agitated delirium.   Events Since Admission: 7/25 -  Admit with clogged VP shunt, old shunt removed, new placed 7/26 -  Agitated delirium 7/28-precedex 7/29- better with rispirdal  Current Status:  In better mood  Vital Signs: Temp:  [98.3 F (36.8 C)-99.4 F (37.4 C)] 98.6 F (37 C) (07/30 0700) Pulse Rate:  [62-79] 71  (07/30 0800) Resp:  [14-21] 17  (07/30 0800) BP: (112-147)/(58-98) 128/76 mmHg (07/30 0800) SpO2:  [100 %] 100 % (07/30 0800) Weight:  [78 kg (171 lb 15.3 oz)] 78 kg (171 lb 15.3 oz) (07/30 0600) Room air   Physical Examination: General:   adult male, "sorry for being mean" Neuro:  shunt site unremarkable  HEENT:  Mm pink, dry Cardiovascular:  s1s2 rrr, no m/r/g, SR on monitor Lungs: cta anterior Abdomen:  Round / soft, bsx4 active Musculoskeletal:  No acute deformities Skin:  Warm / dry  Principal Problem:  *Hydrocephalus Active Problems:  Delirium  Encephalopathy acute  Altered mental status  HTN (hypertension)  Hypernatremia  Oliguria   ASSESSMENT AND PLAN  NEUROLOGIC  A:   Acute Encephalopathy: likely metabolic etiology, ? W/d but family denies any ETOH Hydrocephalus Dysfunctional VP shunt --Revision 7/25  P:   -rec's per NSGY -add thiamine and folate.  -change benzo's to prn only, avoid with dilerium -f/u CT head 7/29, done Risperdal  1 mg q12h, major improved with this, continue  Prn  Haldol -needs pysch evelaution  PULMONARY  CXR:  7/25 >>>Central venous congestion.  Right apical capping could represent pleural fluid  A:  No acute issues.  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 (mL/kg) 1380 (17.7) 100 (1.3)   Total Intake(mL/kg) 1380 (17.7) 100 (1.3)   Urine (mL/kg/hr) 2425 (1.3)    Total Output 2425    Net -1045 +100         Foley:  7/25>>>  A:   Acute Renal Insufficiency-In setting of volume depletion / dehydration Hypernatremia P:   -change IVF kvo as diet started -f/u chem in am   GASTROINTESTINAL  Lab 10/17/11 0715  AST 25  ALT 13  ALKPHOS 64  BILITOT 0.1*  PROT 8.3  ALBUMIN 4.5    A:   Nutrition P:   -diet -kvo  HEMATOLOGIC  Lab 10/20/11 0445 10/17/11 0715  HGB 11.4* 14.7  HCT 33.8* 42.4  PLT 162 218  INR -- 1.29  APTT -- --   A:   DVt prevention P:  -scd  INFECTIOUS  Lab 10/20/11 0445 10/17/11 0715  WBC 4.1 8.0  PROCALCITON -- --   Cultures: 7/25 CSF>>>neg 7/25 CSF gram stain>>>wbc present, predom mononuclear  Antibiotics: 7/25 Ancef>>>7/25 (surgery)  A:   R/O CSF infection  P:   -monitor cultures -no further abx for now  ENDOCRINE  A:    No known hx of DM -glucose controlled on BMP  P:   -monitor bmp   BEST PRACTICE / DISPOSITION Level of Care:  ICU- to floor Primary Service:  NSGY Consultants:  PCCM Code Status:  FULL Diet:  NPO DVT Px:  SCD's GI Px:  Protonix Social / Family:  None available    Will sign off, needs psych eval, continue ridpirdal  Mcarthur Rossetti. Tyson Alias, MD, FACP Pgr: [phone] [facility]

## 2011-10-23 ENCOUNTER — Inpatient Hospital Stay (HOSPITAL_COMMUNITY): Payer: Medicaid Other

## 2011-10-23 DIAGNOSIS — R4182 Altered mental status, unspecified: Secondary | ICD-10-CM

## 2011-10-23 LAB — AMMONIA: Ammonia: 21 umol/L (ref 11–60)

## 2011-10-23 LAB — BASIC METABOLIC PANEL
BUN: 7 mg/dL (ref 6–23)
Chloride: 106 mEq/L (ref 96–112)
GFR calc Af Amer: >90 mL/min (ref >90)
GFR calc non Af Amer: >90 mL/min (ref >90)
Potassium: 3.5 mEq/L (ref 3.5–5.1)
Sodium: 143 mEq/L (ref 135–145)

## 2011-10-23 LAB — CBC WITH DIFFERENTIAL/PLATELET
Basophils Absolute: 0 10*3/uL (ref 0.0–0.1)
Basophils Relative: 0 % (ref 0–1)
Eosinophils Absolute: 0 10*3/uL (ref 0.0–0.7)
HCT: 36.8 % — ABNORMAL LOW (ref 39.0–52.0)
Hemoglobin: 12.4 g/dL — ABNORMAL LOW (ref 13.0–17.0)
Lymphs Abs: 1.2 10*3/uL (ref 0.7–4.0)
MCH: 28.6 pg (ref 26.0–34.0)
MCHC: 34 g/dL (ref 30.0–36.0)
Monocytes Relative: 13 % — ABNORMAL HIGH (ref 3–12)
Neutro Abs: 6.3 10*3/uL (ref 1.7–7.7)
Neutrophils Relative %: 72 % (ref 43–77)
Neutrophils Relative %: 80 % — ABNORMAL HIGH (ref 43–77)
Platelets: 227 10*3/uL (ref 150–400)
RBC: 4.34 MIL/uL (ref 4.22–5.81)
RDW: 14.9 % (ref 11.5–15.5)

## 2011-10-23 LAB — COMPREHENSIVE METABOLIC PANEL
Albumin: 2.9 g/dL — ABNORMAL LOW (ref 3.5–5.2)
Alkaline Phosphatase: 85 U/L (ref 39–117)
BUN: 10 mg/dL (ref 6–23)
CO2: 28 mEq/L (ref 19–32)
Chloride: 106 mEq/L (ref 96–112)
GFR calc Af Amer: >90 mL/min (ref >90)
GFR calc non Af Amer: 87 mL/min — ABNORMAL LOW (ref >90)
Glucose, Bld: 106 mg/dL — ABNORMAL HIGH (ref 70–99)
Potassium: 3.8 mEq/L (ref 3.5–5.1)
Total Bilirubin: 0.4 mg/dL (ref 0.3–1.2)

## 2011-10-23 LAB — URINE MICROSCOPIC-ADD ON

## 2011-10-23 LAB — URINALYSIS, ROUTINE W REFLEX MICROSCOPIC
Glucose, UA: NEGATIVE mg/dL
Protein, ur: NEGATIVE mg/dL
Specific Gravity, Urine: 1.021 (ref 1.005–1.030)

## 2011-10-23 MED ORDER — QUETIAPINE FUMARATE 25 MG PO TABS
25.0000 mg | ORAL_TABLET | Freq: Two times a day (BID) | ORAL | Status: DC
  Administered 2011-10-23 – 2011-10-29 (×13): 25 mg via ORAL
  Filled 2011-10-23 (×14): qty 1

## 2011-10-23 NOTE — Progress Notes (Addendum)
[facility] PROGRESS NOTE    SUBJECTIVE   Patient is alert but would not answer any questions.  He continues to talk about "train tracks" and intermittently will tell me to stop doing aspects of the exam that are bothersome to him.  Sitter at bedside stated he has fluctuates in and out of confusion.  OBJECTIVE   Vital signs in last 24 hours: Temp:  [98 F (36.7 C)-100.6 F (38.1 C)] 99.5 F (37.5 C) (07/31 1000) Pulse Rate:  [70-97] 90  (07/31 1000) Resp:  [16-20] 18  (07/31 1000) BP: (122-152)/(75-89) 122/77 mmHg (07/31 1000) SpO2:  [98 %-100 %] 98 % (07/31 1000)  Intake/Output from previous day: 07/30 0701 - 07/31 0700 In: 100 [I.V.:100] Out: -  Intake/Output this shift: Total I/O In: 180 [P.O.:180] Out: -  Nutritional status: General  History reviewed. No pertinent past medical history.  Neurologic Exam:  Mental Status: Alert, not oriented and cannot answer question of where he is. HE tends to stare forward and talking about "train tracks". When doing lower body exam he did tell me to "stop" when testing babinski but would not follow any other commands. Speechdysarthric without evidence of aphasia.   Cranial Nerves: II-blinks to threat but would not track my finger or count fingers III/IV/VI-Extraocular movements intact as he does look around room.  Pupils reactive bilaterally. Ptosis not present. V/VII-Smile symmetric VIII-looks at me when I clap hands XII-midline tongue extension Motor: Patient moving all extremities purposefully and antigravity proximally.  He would not squeeze my hand on the left.  He withdrew bilaterally to plantar simulation but this was minimal.  Sensory: Pinprick throughout, bilaterally Deep Tendon Reflexes: 1+ and symmetric throughout.     Plantars:      Right:  downgoing     Left:  Downgoing   Lab Results: Results for orders placed during the hospital encounter of 10/17/11 (from the past  24 hour(s))  BASIC METABOLIC PANEL     Status: Abnormal   Collection Time   10/23/11  5:00 AM      Component Value Range   Sodium 143  135 - 145 mEq/L   Potassium 3.5  3.5 - 5.1 mEq/L   Chloride 106  96 - 112 mEq/L   CO2 29  19 - 32 mEq/L   Glucose, Bld 106 (*) 70 - 99 mg/dL   BUN 7  6 - 23 mg/dL   Creatinine, Ser 1.61  0.50 - 1.35 mg/dL   Calcium 8.8  8.4 - 09.6 mg/dL   GFR calc non Af Amer >90  >90 mL/min   GFR calc Af Amer >90  >90 mL/min   Lipid Panel No results found for this basename: CHOL,TRIG,HDL,CHOLHDL,VLDL,LDLCALC in the last 72 hours  Studies/Results: No results found.  Medications:     Scheduled:   . chlorhexidine  15 mL Mouth Rinse BID  . docusate sodium  100 mg Oral BID  . folic acid  1 mg Intravenous Daily  . pantoprazole (PROTONIX) IV  40 mg Intravenous QHS  . pneumococcal 23 valent vaccine  0.5 mL Intramuscular Tomorrow-1000  . potassium chloride  40 mEq Oral BID  . QUEtiapine  25 mg Oral BID  . risperiDONE  1 mg Oral BID  . thiamine  100 mg Intravenous Daily  Assessment/Plan:   Status post ventriculoperitoneal shunt replacement with overall improved mental status compared to patient's level of confusion and dysfunction on admission.  Patient's baseline cognitive as well as motor coordination and gait stability are unknown. Extent and duration of worsening hydrocephalus associated with shunt dysfunction is unknown.   Recommendations:  1. Continue with Seroquel 25 mg BID 2. EEG to assess severity of encephalopathy. Also will obtain B12, folate, RPR, TSH and Thiamine level 3. Will order CBC, UA and CXR to evaluate for infection as this may be a source of confusion.  HE has had temperatures fluctuating from 99.5 to 100.3 over last 24 hours.  4. Physical therapy and occupational therapy to see patient      Felicie Morn PA-C [facility] [phone]  10/23/2011, 12:22 PM

## 2011-10-23 NOTE — Progress Notes (Signed)
Patient ID: Kurt Walton, male   DOB: [birth year], 61 y.o.   MRN: [id number] Subjective:  The patient is alert and pleasant. He knows he is in a hospital.  Objective: Vital signs in last 24 hours: Temp:  [98 F (36.7 C)-100.6 F (38.1 C)] 98.1 F (36.7 C) (07/31 0502) Pulse Rate:  [65-97] 95  (07/31 0502) Resp:  [16-20] 20  (07/31 0502) BP: (128-152)/(75-89) 130/75 mmHg (07/31 0502) SpO2:  [94 %-100 %] 98 % (07/31 0502) Weight:  [79.9 kg (176 lb 2.4 oz)] 79.9 kg (176 lb 2.4 oz) (07/30 1020)  Intake/Output from previous day: 07/30 0701 - 07/31 0700 In: 100 [I.V.:100] Out: -  Intake/Output this shift: Total I/O In: 60 [P.O.:60] Out: -   Physical exam the patient is alert and pleasant. Glasgow Coma Scale 14. He is moving all 4 extremities well. His wounds are healing well.  Lab Results: No results found for this basename: WBC:2,HGB:2,HCT:2,PLT:2 in the last 72 hours BMET  Basename 10/23/11 0500 10/22/11 0340  NA 143 142  K 3.5 3.2*  CL 106 105  CO2 29 29  GLUCOSE 106* 101*  BUN 7 5*  CREATININE 0.89 0.90  CALCIUM 8.8 8.5    Studies/Results: No results found.  Assessment/Plan: Postop day #6: Patient seems to be making some progress. I will ask PT and OT to see the patient.  LOS: 6 days     Amina Menchaca D 10/23/2011, 9:51 AM

## 2011-10-23 NOTE — Procedures (Signed)
EEG NUMBER:  REFERRING PHYSICIAN:  Dr. Roseanne Reno.  HISTORY:  A 61 year old male with altered mental status.  MEDICATIONS:  Seroquel, Risperdal, thiamine, folic acid, Protonix, potassium, Haldol, Ativan.  CONDITIONS OF RECORDING:  This is a 16 channel EEG carried out with the patient in the awake, but uncooperative state.  DESCRIPTION:  The posterior background rhythm is poorly sustained, but noted to be 7-8 Hz theta activity seen over the parieto-occipital and posterotemporal regions.  Low-voltage, fast activity, poorly organized was seen anteriorly at times, superimposed on more posterior rhythms.  A mixture of theta and alpha was seen from the central and temporal regions.  The patient does not drowse or sleep.  Hyperventilation was not performed.  Intermittent photic stimulation failed to elicit any change in the tracing.  Subtle asymmetries and abnormalities were difficult to be evaluated secondary to the presence of movement and motion artifact that were noted throughout the tracing secondary to the patient's inability to cooperate.  IMPRESSION:  This is an abnormal EEG secondary to posterior background slowing.  This can be seen with a diffuse gray matter disturbance that is etiologically nonspecific, but may include dementia among other possibilities.  No epileptiform activity was noted.  COMMENT:  An EEG with the patient is sleep deprived to elicit drowse and light sleep may be desirable to further elicit a possible seizure disorder.          ______________________________ Thana Farr, MD    [id number] D:  10/23/2011 19:01:28  T:  10/23/2011 21:30:08  Job #:  [id number]

## 2011-10-23 NOTE — Progress Notes (Signed)
Routine EEG completed.  

## 2011-10-23 NOTE — Clinical Social Work Note (Signed)
CSW received consult for SNF. Awaiting PT evals. Brief Psychosocial Assessment to follow when this CSW speaks with pt's daughter. CSW will continue to follow. Please call with any urgent needs.   Dede Query, MSW, [facility]ors [phone]

## 2011-10-24 ENCOUNTER — Encounter (HOSPITAL_COMMUNITY): Payer: Self-pay

## 2011-10-24 LAB — URINE CULTURE

## 2011-10-24 LAB — RPR: RPR Ser Ql: NONREACTIVE

## 2011-10-24 MED ORDER — VITAMIN B-1 100 MG PO TABS
100.0000 mg | ORAL_TABLET | Freq: Every day | ORAL | Status: DC
  Administered 2011-10-25 – 2011-10-29 (×5): 100 mg via ORAL
  Filled 2011-10-24 (×6): qty 1

## 2011-10-24 MED ORDER — NICOTINE 14 MG/24HR TD PT24
14.0000 mg | MEDICATED_PATCH | TRANSDERMAL | Status: DC
  Administered 2011-10-24 – 2011-10-28 (×5): 14 mg via TRANSDERMAL
  Filled 2011-10-24 (×6): qty 1

## 2011-10-24 MED ORDER — FOLIC ACID 1 MG PO TABS
1.0000 mg | ORAL_TABLET | Freq: Every day | ORAL | Status: DC
  Administered 2011-10-25 – 2011-10-29 (×5): 1 mg via ORAL
  Filled 2011-10-24 (×6): qty 1

## 2011-10-24 MED ORDER — PANTOPRAZOLE SODIUM 40 MG PO TBEC
40.0000 mg | DELAYED_RELEASE_TABLET | Freq: Every day | ORAL | Status: DC
  Administered 2011-10-24 – 2011-10-28 (×5): 40 mg via ORAL
  Filled 2011-10-24 (×4): qty 1

## 2011-10-24 NOTE — Progress Notes (Signed)
PT/OT Cancellation Note  Treatment cancelled today due to pt too agitated to participate in session.  Removed pillow from underneath pt shoulders and pt began yelling for therapists to get out. Pt became increasingly agitated as therapists attempted to explain to pt need to sit up EOB.  Restraints positioned properly when therapy left. RN notified.  Will re-attempt as approprate.  Thanks.  10/24/2011 Cipriano Mile OTR/L Pager [phone] Office [phone]

## 2011-10-24 NOTE — Progress Notes (Signed)
10/24/2011 Milana Kidney DPT PAGER: [phone] OFFICE: [phone]

## 2011-10-24 NOTE — Clinical Social Work Note (Signed)
CSW placed phone calls to pt's daughters, Joni Reining and [name], and left messages requesting return phone calls. The financial counselors are working on a Personal assistant. Per financial counseling, pt's disability is active today. CSW will continue to follow to complete assessment, as pt is not oriented at this time.   Dede Query, MSW, [facility] [phone]

## 2011-10-24 NOTE — Progress Notes (Signed)
Dr. Lovell Sheehan called regarding pt's fever of 100.9. Pt given Tylenol 650 mg hour earlier with no change. CBC, Urine culture, and blood cultures ordered. Pt will continue to be monitored. Salvadore Oxford, RN

## 2011-10-24 NOTE — Progress Notes (Signed)
[facility] PROGRESS NOTE    SUBJECTIVE   States he feels "better than he has".  He still is not holding a full conversation but able to hold attention and follow simple commands. When asked where he is he tells me --" I forgot" .   OBJECTIVE   Vital signs in last 24 hours: Temp:  [98.1 F (36.7 C)-102.4 F (39.1 C)] 98.1 F (36.7 C) (08/01 1057) Pulse Rate:  [69-79] 79  (08/01 1057) Resp:  [17-22] 18  (08/01 1057) BP: (96-121)/(54-72) 121/72 mmHg (08/01 1057) SpO2:  [98 %-100 %] 98 % (08/01 1057)  Intake/Output from previous day: 07/31 0701 - 08/01 0700 In: 180 [P.O.:180] Out: 425 [Urine:425] Intake/Output this shift:   Nutritional status: General  History reviewed. No pertinent past medical history.  Neurologic Exam:  Mental Status: Alert,not oriented to place -states "I forgot where I am".  He will follow simple commands such as reaching out and touching my finger, tracking my finger and lifting his legs.   Cranial Nerves: II-Visual fields grossly intact. III/IV/VI-Extraocular movements intact.  Pupils reactive bilaterally. Ptosis not present. V/VII-Smile symmetric VIII-grossly intact XII-midline tongue extension Motor: Moving all extremities UE>LE.   Sensory: Pinprick and light touch intact throughout with distal LE decreased sensation below calf. Deep Tendon Reflexes: 1+ and symmetric throughout.     Plantars:      Right:  downgoing     Left:  upgoing    Lab Results: No results found for this basename: cbc, bmp, coags, chol, tri, ldl, hga1c   Lipid Panel No results found for this basename: CHOL,TRIG,HDL,CHOLHDL,VLDL,LDLCALC in the last 72 hours  Studies/Results: Dg Chest Port 1 View  10/23/2011  *RADIOLOGY REPORT*  Clinical Data: Confusion.  PORTABLE CHEST - 1 VIEW  Comparison: Chest x-ray 10/17/2011.  Findings: There is a new catheter extending from the right cervical region projecting over the right  hemithorax and extending into the abdomen, presumably a ventriculoperitoneal shunt catheter. The visualized portions of the catheter appear continuous.  Lung volumes are lower limits of normal.  No acute consolidative airspace disease.  No pleural effusions.  Mild bilateral apical pleuroparenchymal thickening is similar to priors, presumably post infectious scarring.  Heart size is within normal limits. Mediastinal contours are unremarkable allowing for the lordotic positioning and slight rotation to the right.  Atherosclerosis of the thoracic aorta.  IMPRESSION: 1.  No radiographic evidence of acute cardiopulmonary disease. 2.  Atherosclerosis. 3.  Interval placement of a VP shunt catheter, as above.  Original Report Authenticated By: Florencia Reasons, M.D.    Medications:     Scheduled:   . chlorhexidine  15 mL Mouth Rinse BID  . docusate sodium  100 mg Oral BID  . folic acid  1 mg Intravenous Daily  . pantoprazole (PROTONIX) IV  40 mg Intravenous QHS  . potassium chloride  40 mEq Oral BID  . QUEtiapine  25 mg Oral BID  . risperiDONE  1 mg Oral BID  . thiamine  100 mg Intravenous Daily    Assessment/Plan:  Status post ventriculoperitoneal shunt replacement with overall improved mental status compared to patient's level of confusion and dysfunction on admission. Patient's baseline cognitive as well as motor coordination and gait stability are unknown. Extent and duration of worsening hydrocephalus associated  with shunt dysfunction is unknown Patient is slowly improving with mental status however his baseline is unknown.    EEG showed posterior background slowing but no epileptiform activity. TSH was 2.497, RPR was negative.  Over last 24 hours his temperature has ranged from 98.1-102.4. At present time blood cultures are pending, Urine cultures are pending, CXR was negative, WBC 7.8.   Recommend: 1. Continue with Seroquel 25 mg BID  2. Continue to look into possible infection and treat  accordingly 3. SNF placement at discharge    Felicie Morn PA-C [facility] [phone]  10/24/2011, 12:07 PM

## 2011-10-24 NOTE — Progress Notes (Signed)
Patient ID: Kurt Walton, male   DOB: 11/01/1950, 61 y.o.   MRN: [id number] Subjective:  The patient is alert and pleasant. He remains a bit confused but seems to be improving.  Objective: Vital signs in last 24 hours: Temp:  [98.1 F (36.7 C)-102.4 F (39.1 C)] 98.1 F (36.7 C) (08/01 1057) Pulse Rate:  [69-79] 79  (08/01 1057) Resp:  [17-22] 18  (08/01 1057) BP: (96-121)/(54-72) 121/72 mmHg (08/01 1057) SpO2:  [98 %-100 %] 98 % (08/01 1057)  Intake/Output from previous day: 07/31 0701 - 08/01 0700 In: 180 [P.O.:180] Out: 425 [Urine:425] Intake/Output this shift:    Physical exam the patient is alert and oriented x2 (person in a hospital) his pupils are equal round reactive light. He is moving all 4 extremities well. His wounds are healing well without signs of infection.  Lab Results:  [facility] 10/23/11 2154 10/23/11 1548  WBC 7.8 8.2  HGB 11.4* 12.4*  HCT 33.5* 36.8*  PLT 227 242   BMET  Basename 10/23/11 1548 10/23/11 0500  NA 144 143  K 3.8 3.5  CL 106 106  CO2 28 29  GLUCOSE 106* 106*  BUN 10 7  CREATININE 0.98 0.89  CALCIUM 9.1 8.8    Studies/Results: Dg Chest Port 1 View  10/23/2011  *RADIOLOGY REPORT*  Clinical Data: Confusion.  PORTABLE CHEST - 1 VIEW  Comparison: Chest x-ray 10/17/2011.  Findings: There is a new catheter extending from the right cervical region projecting over the right hemithorax and extending into the abdomen, presumably a ventriculoperitoneal shunt catheter. The visualized portions of the catheter appear continuous.  Lung volumes are lower limits of normal.  No acute consolidative airspace disease.  No pleural effusions.  Mild bilateral apical pleuroparenchymal thickening is similar to priors, presumably post infectious scarring.  Heart size is within normal limits. Mediastinal contours are unremarkable allowing for the lordotic positioning and slight rotation to the right.  Atherosclerosis of the thoracic aorta.  IMPRESSION: 1.  No  radiographic evidence of acute cardiopulmonary disease. 2.  Atherosclerosis. 3.  Interval placement of a VP shunt catheter, as above.  Original Report Authenticated By: Florencia Reasons, M.D.    Assessment/Plan: Postop day #7: The patient seems to be making some improvement. He will likely need rehabilitation. We will remove the patient's scalp staples.  Fever: The patient's white count was normal, his chest x-ray is unremarkable, his urinalysis is normal. We will await cultures.  LOS: 7 days     Joyceann Kruser D 10/24/2011, 1:48 PM

## 2011-10-25 LAB — BASIC METABOLIC PANEL
BUN: 14 mg/dL (ref 6–23)
GFR calc non Af Amer: 89 mL/min — ABNORMAL LOW (ref >90)
Glucose, Bld: 107 mg/dL — ABNORMAL HIGH (ref 70–99)
Potassium: 4 mEq/L (ref 3.5–5.1)

## 2011-10-25 MED ORDER — WHITE PETROLATUM GEL
Status: AC
  Filled 2011-10-25: qty 5

## 2011-10-25 NOTE — Progress Notes (Signed)
Patient ID: Kurt Walton, male   DOB: Jan 29, 1951, 61 y.o.   MRN: [id number] Subjective:  The patient is alert and pleasant. He remains a bit confused.  Objective: Vital signs in last 24 hours: Temp:  [98.1 F (36.7 C)-98.9 F (37.2 C)] 98.3 F (36.8 C) (08/02 0550) Pulse Rate:  [74-87] 84  (08/02 0550) Resp:  [16-18] 16  (08/02 0550) BP: (119-139)/(66-76) 120/67 mmHg (08/02 0550) SpO2:  [96 %-100 %] 96 % (08/02 0550)  Intake/Output from previous day: 08/01 0701 - 08/02 0700 In: -  Out: 1250 [Urine:1250] Intake/Output this shift:    Physical exam the patient is alert and oriented x2 he is moving all 4 extremities well. His incisions are healing well.  Lab Results:  [facility] 10/23/11 2154 10/23/11 1548  WBC 7.8 8.2  HGB 11.4* 12.4*  HCT 33.5* 36.8*  PLT 227 242   BMET  Basename 10/25/11 0530 10/23/11 1548  NA 140 144  K 4.0 3.8  CL 103 106  CO2 26 28  GLUCOSE 107* 106*  BUN 14 10  CREATININE 0.92 0.98  CALCIUM 9.1 9.1    Studies/Results: Dg Chest Port 1 View  10/23/2011  *RADIOLOGY REPORT*  Clinical Data: Confusion.  PORTABLE CHEST - 1 VIEW  Comparison: Chest x-ray 10/17/2011.  Findings: There is a new catheter extending from the right cervical region projecting over the right hemithorax and extending into the abdomen, presumably a ventriculoperitoneal shunt catheter. The visualized portions of the catheter appear continuous.  Lung volumes are lower limits of normal.  No acute consolidative airspace disease.  No pleural effusions.  Mild bilateral apical pleuroparenchymal thickening is similar to priors, presumably post infectious scarring.  Heart size is within normal limits. Mediastinal contours are unremarkable allowing for the lordotic positioning and slight rotation to the right.  Atherosclerosis of the thoracic aorta.  IMPRESSION: 1.  No radiographic evidence of acute cardiopulmonary disease. 2.  Atherosclerosis. 3.  Interval placement of a VP shunt catheter, as  above.  Original Report Authenticated By: Florencia Reasons, M.D.    Assessment/Plan: Postop day #8: The patient remains confused but seems to be slowly improving. He will need rehabilitation or nursing home placement.  LOS: 8 days     Latravious Levitt D 10/25/2011, 8:00 AM

## 2011-10-25 NOTE — Progress Notes (Signed)
Occupational Therapy Evaluation Patient Details Name: Kurt Walton MRN: [id number] DOB: 09/06/1950 Today's Date: 10/25/2011 Time: 7829-5621 OT Time Calculation (min): 18 min  OT Assessment / Plan / Recommendation Clinical Impression  Pt admitted with AMS and increased hydropcephalus. Pt had a shunt revision/placement of a ventriculoperitoneal shunt 8 days ago.  Will benefit from acute OT services to address below problem list in prep for d/c to SNF.    OT Assessment  Patient needs continued OT Services    Follow Up Recommendations  Skilled nursing facility    Barriers to Discharge      Equipment Recommendations  Defer to next venue    Recommendations for Other Services    Frequency  Min 2X/week    Precautions / Restrictions Precautions Precautions: Fall   Pertinent Vitals/Pain See vitals    ADL  Upper Body Bathing: Simulated;Maximal assistance Where Assessed - Upper Body Bathing: Supported sitting Lower Body Bathing: Simulated;+1 Total assistance Where Assessed - Lower Body Bathing: Supported sitting Upper Body Dressing: Performed;Maximal assistance (donned gown) Where Assessed - Upper Body Dressing: Supported sitting Lower Body Dressing: Performed;+1 Total assistance Where Assessed - Lower Body Dressing: Supported sitting Toilet Transfer: Simulated;+2 Total assistance Toilet Transfer: Patient Percentage: 50% Toilet Transfer Method: Other (comment) (unable to obtain full standing position) Statistician Equipment:  (EOB)    OT Diagnosis: Generalized weakness;Cognitive deficits  OT Problem List: Decreased activity tolerance;Impaired balance (sitting and/or standing);Decreased cognition;Decreased knowledge of use of DME or AE OT Treatment Interventions: Self-care/ADL training;DME and/or AE instruction;Therapeutic activities;Cognitive remediation/compensation;Patient/family education;Balance training   OT Goals Acute Rehab OT Goals OT Goal Formulation: With  patient Time For Goal Achievement: 11/08/11 Potential to Achieve Goals: Fair ADL Goals Pt Will Transfer to Toilet: with mod assist;Stand pivot transfer;with DME;3-in-1 ADL Goal: Toilet Transfer - Progress: Goal set today Miscellaneous OT Goals Miscellaneous OT Goal #1: Pt will perform bed mobility with min assist in prep for EOB ADLs. OT Goal: Miscellaneous Goal #1 - Progress: Goal set today Miscellaneous OT Goal #2: Pt will perform static sitting balance with supervision >5 min in prep for functional transfer. OT Goal: Miscellaneous Goal #2 - Progress: Goal set today Miscellaneous OT Goal #3: Pt will attend to right side with min verbal cueing 75% of time. OT Goal: Miscellaneous Goal #3 - Progress: Goal set today  Visit Information  Last OT Received On: 10/25/11 Assistance Needed: +2 PT/OT Co-Evaluation/Treatment: Yes    Subjective Data      Prior Functioning  Vision/Perception  Home Living Additional Comments: Pt unable to provide home living information (poor historian), and caregiver/family unavailable to provide infromation. Prior Function Comments: Pt unable to provide PLOF info.   Vision - Assessment Additional Comments: Unable to fully assess due to vision. Pt keeps head turned to left side, unsure whether pt with right visual field neglect.  Cognition  Overall Cognitive Status: No family/caregiver present to determine baseline cognitive functioning Arousal/Alertness: Awake/alert Orientation Level: Disoriented to;Place;Time Behavior During Session: [facility] for tasks performed Cognition - Other Comments: able to understand pt this session, although thoughts not coherent. Pt inconsistent with following commands.     Extremity/Trunk Assessment Right Upper Extremity Assessment RUE ROM/Strength/Tone: Unable to fully assess;Due to impaired cognition Left Upper Extremity Assessment LUE ROM/Strength/Tone: Unable to fully assess;Due to impaired cognition   Mobility     Exercise     Balance    End of Session OT - End of Session Equipment Utilized During Treatment: Gait belt Activity Tolerance: Patient tolerated treatment well Patient left: in  bed;with call bell/phone within reach;with bed alarm set Nurse Communication: Mobility status  GO    10/25/2011 Cipriano Mile OTR/L Pager [phone] Office [phone]  Cipriano Mile 10/25/2011, 2:35 PM

## 2011-10-25 NOTE — Clinical Social Work Placement (Addendum)
    Clinical Social Work Department CLINICAL SOCIAL WORK PLACEMENT NOTE 10/25/2011  Patient:  DIMITRIOS, BALESTRIERI  Account Number:  [id number] Admit date:  10/17/2011  Clinical Social Worker:  Macario Golds, Theresia Majors  Date/time:  10/25/2011 03:10 PM  Clinical Social Work is seeking post-discharge placement for this patient at the following level of care:   SKILLED NURSING   (*CSW will update this form in Epic as items are completed)   10/25/2011  Patient/family provided with [facility] Department of Clinical Social Work's list of facilities offering this level of care within the geographic area requested by the patient (or if unable, by the patient's family).  10/25/2011  Patient/family informed of their freedom to choose among providers that offer the needed level of care, that participate in Medicare, Medicaid or managed care program needed by the patient, have an available bed and are willing to accept the patient.  10/25/2011  Patient/family informed of [facility]' ownership interest in [facility], as well as of the fact that they are under no obligation to receive care at this facility.  PASARR submitted to EDS on 10/25/2011 PASARR number received from EDS on 10/25/2011  FL2 transmitted to all facilities in geographic area requested by pt/family on  10/25/2011 FL2 transmitted to all facilities within larger geographic area on   Patient informed that his/her managed care company has contracts with or will negotiate with  certain facilities, including the following:     Patient/family informed of bed offers received:  10/29/2011 Patient chooses bed at [facility] Physician recommends and patient chooses bed at  SNF  Patient to be transferred to [place] on 10/29/2011 Patient to be transferred to facility by [facility]  The following physician request were entered in Epic:   Additional Comments: Patient will be a Letter of Guarantee Placement

## 2011-10-25 NOTE — Evaluation (Signed)
Physical Therapy Evaluation Patient Details Name: Kurt Walton MRN: [id number] DOB: [birth year] Today's Date: 10/25/2011 Time: 4098-1191 PT Time Calculation (min): 18 min  PT Assessment / Plan / Recommendation Clinical Impression  Pt admitted with AMS and increased hydropcephalus. Pt had a shunt revision/placement of a ventriculoperitoneal shunt 8 days ago. Pt still with disorientation, although more alert and focused than previously. Pt with decreased weakness and functional mobility, unknown if from prolonged bed rest or other neurological changes. Pt will benefit from skilled PT in the acute care setting in order to maximize functional mobility prior to d/c    PT Assessment  Patient needs continued PT services    Follow Up Recommendations  Skilled nursing facility       Equipment Recommendations  Defer to next venue       Frequency Min 3X/week    Precautions / Restrictions Precautions Precautions: Fall Restrictions Weight Bearing Restrictions: No         Mobility  Bed Mobility Bed Mobility: Supine to Sit;Sitting - Scoot to Edge of Bed;Sit to Supine Supine to Sit: 1: +2 Total assist;With rails;HOB elevated Supine to Sit: Patient Percentage: 40% Sitting - Scoot to Edge of Bed: 3: Mod assist Sit to Supine: 1: +2 Total assist Sit to Supine: Patient Percentage: 40% Details for Bed Mobility Assistance: VC for proper sequencing. Assist through trunk for support as pt with posterior lean during transfer. Minimal assist at LEs both in/out of bed. Transfers Transfers: Sit to Stand;Stand to Sit Details for Transfer Assistance: Unable to achieve full sit to stand. Attempted x 6 with Total Assist x 2, 50% Ambulation/Gait Ambulation/Gait Assistance: Not tested (comment)        PT Diagnosis: Difficulty walking;Generalized weakness;Altered mental status  PT Problem List: Decreased strength;Decreased activity tolerance;Decreased balance;Decreased mobility;Decreased knowledge of use  of DME;Decreased safety awareness;Decreased knowledge of precautions;Decreased cognition PT Treatment Interventions: DME instruction;Gait training;Functional mobility training;Therapeutic activities;Therapeutic exercise;Balance training;Neuromuscular re-education;Patient/family education   PT Goals Acute Rehab PT Goals PT Goal Formulation: Patient unable to participate in goal setting Time For Goal Achievement: 11/08/11 Potential to Achieve Goals: Fair Pt will go Supine/Side to Sit: with min assist PT Goal: Supine/Side to Sit - Progress: Goal set today Pt will Sit at Edge of Bed: 6-10 min;with bilateral upper extremity support;with supervision PT Goal: Sit at Edge Of Bed - Progress: Goal set today Pt will go Sit to Supine/Side: with min assist PT Goal: Sit to Supine/Side - Progress: Goal set today Pt will go Sit to Stand: with mod assist PT Goal: Sit to Stand - Progress: Goal set today Pt will go Stand to Sit: with mod assist PT Goal: Stand to Sit - Progress: Goal set today Pt will Transfer Bed to Chair/Chair to Bed: with mod assist PT Transfer Goal: Bed to Chair/Chair to Bed - Progress: Goal set today  Visit Information  Last PT Received On: 10/25/11          Cognition  Overall Cognitive Status: No family/caregiver present to determine baseline cognitive functioning Arousal/Alertness: Awake/alert Orientation Level: Disoriented to;Place;Time Behavior During Session: [facility] for tasks performed Cognition - Other Comments: able to understand pt this session, although thoughts not coherent. Pt inconsistent with following commands.     Extremity/Trunk Assessment Right Lower Extremity Assessment RLE ROM/Strength/Tone: Unable to fully assess;Due to impaired cognition Left Lower Extremity Assessment LLE ROM/Strength/Tone: Unable to fully assess;Due to impaired cognition   Balance Balance Balance Assessed: Yes Static Sitting Balance Static Sitting - Balance Support: Bilateral upper  extremity supported;Feet  supported Static Sitting - Level of Assistance: 5: Stand by assistance;3: Mod assist Static Sitting - Comment/# of Minutes: Pt sitting at EOB for ~10 minutes with intermittent control and both posterior/anterior leaning. Cueing through pelvis and trunk for support.   End of Session PT - End of Session Equipment Utilized During Treatment: Gait belt Activity Tolerance: Patient limited by fatigue Patient left: in bed;with call bell/phone within reach;with bed alarm set Nurse Communication: Mobility status    Milana Kidney 10/25/2011, 9:05 AM  10/25/2011 Milana Kidney DPT PAGER: [phone] OFFICE: [phone]  10/25/2011 Milana Kidney DPT PAGER: [phone] OFFICE: [phone]

## 2011-10-25 NOTE — Clinical Social Work Psychosocial (Signed)
     Clinical Social Work Department BRIEF PSYCHOSOCIAL ASSESSMENT 10/25/2011  Patient:  Kurt Walton, Kurt Walton     Account Number:  [id number]     Admit date:  10/17/2011  Clinical Social Worker:  Pearson Forster  Date/Time:  10/25/2011 03:10 PM  Referred by:  Physician  Date Referred:  10/23/2011 Referred for  SNF Placement   Other Referral:   Interview type:  Family Other interview type:   Patient not currently oriented and patient family at bedside    PSYCHOSOCIAL DATA Living Status:  ALONE Admitted from facility:   Level of care:   Primary support name:  Kurt Walton,Kurt Walton  [phone] Primary support relationship to patient:  CHILD, ADULT Degree of support available:   Strong    CURRENT CONCERNS Current Concerns  Post-Acute Placement   Other Concerns:    SOCIAL WORK ASSESSMENT / PLAN Clinical Social Worker spoke with patient daughter over the phone and followed with remaining family at patient bedside to offer support and discuss patient plans at discharge. Patient daughter confirmed that patient was living alone prior to this hospitalization and agrees that it would not be safe for him to return home alone at discharge.  CSW explained the SNF process to patient family and family is agreeable to iniate search.  CSW explained SNF search initiation would occur in [place] and the surrounding counties due to patient lack of insurance coverage. Patient family understanding, however unrealistic regarding possible placement options at discharge.    Patient family is completing Disability/Medicaid applications on patient behalf.  CSW will follow up with patient family to provide bed offers and facilitate patient discharge needs once medically ready.   Assessment/plan status:  Psychosocial Support/Ongoing Assessment of Needs Other assessment/ plan:   Information/referral to community resources:   Visual merchandiser provided patient family with list of facilities in [place] and  surrounding counties.  Patient family seem well connected and already involved with financial counseling.    PATIENTS/FAMILYS RESPONSE TO PLAN OF CARE: Patient alert but only intermittently oriented.  Patient family at bedside and supportive of patient needs at discharge.  Patient family agreeable to SNF with preference to [facility].  Patient and patient family verbalized their appreciation for CSW involvement and continued support.

## 2011-10-25 NOTE — Progress Notes (Signed)
Pt. initally offered dinner, stated he would eat it later.  Went back later, pt. Stated he was hungry, cutting up his food, pt refused, stated doesn't eat after anyone, refused tray. Will try again later.

## 2011-10-26 NOTE — Progress Notes (Signed)
Patient safe in bed; three rails up; mittens on bilaterally; patient managed to rub or pull indwelling foley cath. out of his bladder; balloon intact; no bleeding; no clots; urine amber in collection bag; will trial without foley cath; bladder scan for inability to void; monitor for retention; foley cath. was replaced recently due to retention.

## 2011-10-26 NOTE — Progress Notes (Signed)
Overall unchanged. Remains confused. He is afebrile. Follows commands bilaterally. Chest and abdomen benign.  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 Patient remains confused postop but otherwise doing well. Shunt appears to be functioning well no evidence of infection. Continue current care.

## 2011-10-26 NOTE — Progress Notes (Addendum)
History: Kurt Walton is a 61 y.o. male admitted on 10/17/2011 for confusion and combativeness.  CT head scan revealed hydrocephalus and  ventriculoperitoneal shunt. Blockage of shunt was likely etiology for his encephalopathic state. Patient underwent ventriculoperitoneal shunt replacement on 10/18/2011. Postoperatively he had marked confusion and agitation. There was transient hypernatremia which resolved. CSF showed no signs of infectious process. He has not been febrile.  However he remains confused and intermittently agitated and has required a sitter for safety, in addition to chemical restraints with Haldol and Ativan when necessary. At one point he required physical restraints. Patient's baseline cognitive functioning is unknown, as there is no family available to give reliable history.  Most recent CT scan on 10/21/2011 showed stable appearance of ventricles following revision of ventriculostomy catheter. Marked cerebellar atrophy and fourth ventricle dilation are noted. There were no acute abnormalities.  Subjective: I'm waiting for someone to come and get me.  Don't know why I'm here, they just told me to come.  Unable to state where 'here' is.  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  Said he was one of the first black patients to be cared for at [facility].  Objective: BP 112/65  Pulse 75  Temp 97.9 F (36.6 C) (Oral)  Resp 16  Ht 5\' 9"  (1.753 m)  Wt 79.9 kg (176 lb 2.4 oz)  BMI 26.01 kg/m2  SpO2 96%  Medications: Scheduled:   . chlorhexidine  15 mL Mouth Rinse BID  . docusate sodium  100 mg Oral BID  . folic acid  1 mg Oral Daily  . nicotine  14 mg Transdermal Q24H  . pantoprazole  40 mg Oral QHS  . potassium chloride  40 mEq Oral BID  . QUEtiapine  25 mg Oral BID  . risperiDONE  1 mg Oral BID  . thiamine  100 mg Oral Daily  . white petrolatum       Neurologic Exam: Mental Status: Alert, conversant and attentive.  Speech  pattern fluid, language intact, poor insight.  Some delusional thought processes. Oriented to self only.  Exam difficult to perform as patient has difficulty following multi-step and some simple commands.  Denies being hungry, breakfast untouched. Cranial Nerves: II- Visual fields grossly intact. III/IV/VI-Extraocular movements intact with nystagmus lack of horizontal smooth pursuit OD.  Pupils reactive bilaterally. V/VII-Smile symmetric VIII-hearing grossly intact XI-unable to assess XII-midline tongue extension Motor:moves all extremities spontaneously and can raise against gravity. Sensory: Light touch intact throughout Deep Tendon Reflexes: 1+ and symmetric throughout Plantars: Downgoing bilaterally Cerebellar: unable to follow commands to perform.   Lab Results: CBC:  Lab 10/23/11 2154 10/23/11 1548  WBC 7.8 8.2  NEUTROABS 6.3 5.9  HGB 11.4* 12.4*  HCT 33.5* 36.8*  MCV 85.7 84.8  PLT 227 242   Basic Metabolic Panel:  Lab 10/25/11 9562 10/23/11 1548  NA 140 144  K 4.0 3.8  CL 103 106  CO2 26 28  GLUCOSE 107* 106*  BUN 14 10  CREATININE 0.92 0.98  CALCIUM 9.1 9.1  MG -- --  PHOS -- --   Liver Function Tests:  Lab 10/23/11 1548  AST 72*  ALT 48  ALKPHOS 85  BILITOT 0.4  PROT 6.8  ALBUMIN 2.9*   Thyroid Function Tests:  Lab 10/23/11 1548  TSH 2.497  T4TOTAL --  FREET4 --  T3FREE --  THYROIDAB --   Study Results:   10/21/2011  CT HEAD WITHOUT CONTRAST  Findings:  A right parietal ventriculostomy catheter is stable in position. The more posterior right parietal catheter is also stable. Cerebellar atrophy and postsurgical changes in the posterior fossa are again noted. The catheter extending within the subarachnoid space of the upper cervical spine is stable. The ventricular size is stable. Enlargement of the fourth ventricle is secondary to cerebellar atrophy. Minimal pneumocephalus is near completely resolved. No acute infarct, hemorrhage, or mass lesion is  present. There is no significant extra-axial fluid collection. The paranasal sinuses and mastoid air cells are clear. Expansion of the posterior occipital skull around the shunt catheter is stable. This may represent a contained leak, chronic. IMPRESSION: 1. Stable appearance of the ventricles following revision of a ventriculostomy catheter. 2. No acute intracranial abnormality. CHRISTOPHER W. MATTERN, M.D.   10/23/11 EEG: CONDITIONS OF RECORDING: This is a 16 channel EEG carried out with the  patient in the awake, but uncooperative state.  DESCRIPTION: The posterior background rhythm is poorly sustained, but noted to be 7-8 Hz theta activity seen over the parieto-occipital and posterotemporal regions. Low-voltage, fast activity, poorly organized  was seen anteriorly at times, superimposed on more posterior rhythms. A mixture of theta and alpha was seen from the central and temporal regions. The patient does not drowse or sleep. Hyperventilation was not  performed. Intermittent photic stimulation failed to elicit any change in the tracing. Subtle asymmetries and abnormalities were difficult to be evaluated secondary to the presence of movement and motion artifact that were noted throughout the tracing secondary to the patient's inability to cooperate.  IMPRESSION: This is an abnormal EEG secondary to posterior background slowing. This can be seen with a diffuse gray matter disturbance that is etiologically nonspecific, but may include dementia among other possibilities. No epileptiform activity was noted.  COMMENT: An EEG with the patient is sleep deprived to elicit drowse and light sleep may be desirable to further elicit a possible seizure disorder.  Assessment/Plan: Status post ventriculoperitoneal shunt replacement with overall improved mental status compared to patient's level of confusion and dysfunction on admission. Patient's baseline cognitive as well as motor coordination and gait stability are  unknown.   Patient has remained afebrile and normotensive over last 24 hours.  PT/OT recommend SNF.  Case management working on placement.   Recommend:  1. Continue with Seroquel 25 mg BID  2. SNF placement at discharge   LOS: 9 days   Marya Fossa PA-C [facility] [phone] 10/26/2011  9:44 AM

## 2011-10-27 NOTE — Progress Notes (Signed)
Patient ID: Kurt Walton, male   DOB: 04-Aug-1950, 61 y.o.   MRN: [id number] Subjective: Patient reports no headache, no abd pain. Confused as to why he's here.  Objective: Vital signs in last 24 hours: Temp:  [98.2 F (36.8 C)-99.2 F (37.3 C)] 98.2 F (36.8 C) (08/04 0519) Pulse Rate:  [73-85] 73  (08/04 0519) Resp:  [18-20] 20  (08/04 0519) BP: (95-115)/(66-71) 115/66 mmHg (08/04 0519) SpO2:  [97 %-100 %] 100 % (08/04 0519)  Intake/Output from previous day: 08/03 0701 - 08/04 0700 In: 120 [P.O.:120] Out: -  Intake/Output this shift:    Neurologic: Mental status: alertness: alert, orientation: person Motor: moves all ext Shunt site clean, pumps well  Lab Results: Lab Results  Component Value Date   WBC 7.8 10/23/2011   HGB 11.4* 10/23/2011   HCT 33.5* 10/23/2011   MCV 85.7 10/23/2011   PLT 227 10/23/2011   Lab Results  Component Value Date   INR 1.29 10/17/2011   BMET Lab Results  Component Value Date   NA 140 10/25/2011   K 4.0 10/25/2011   CL 103 10/25/2011   CO2 26 10/25/2011   GLUCOSE 107* 10/25/2011   BUN 14 10/25/2011   CREATININE 0.92 10/25/2011   CALCIUM 9.1 10/25/2011    Studies/Results: No results found.  Assessment/Plan: Pt stable, but confused. CCM   LOS: 10 days    Nakeitha Milligan S 10/27/2011, 9:52 AM

## 2011-10-28 NOTE — Progress Notes (Signed)
Subjective:  Patient is alert, easily distracted, talks about random things but did state he was at [facility] and he is here to get better.  Objective: Current vital signs: BP 111/75  Pulse 83  Temp 98.4 F (36.9 C) (Oral)  Resp 20  Ht 5\' 9"  (1.753 m)  Wt 79.9 kg (176 lb 2.4 oz)  BMI 26.01 kg/m2  SpO2 98% Vital signs in last 24 hours: Temp:  [98 F (36.7 C)-98.5 F (36.9 C)] 98.4 F (36.9 C) (08/05 0955) Pulse Rate:  [74-88] 83  (08/05 0955) Resp:  [18-20] 20  (08/05 0955) BP: (111-137)/(71-85) 111/75 mmHg (08/05 0955) SpO2:  [96 %-98 %] 98 % (08/05 0955)  Intake/Output from previous day: 08/04 0701 - 08/05 0700 In: -  Out: 450 [Urine:450] Intake/Output this shift:   Nutritional status: General  Neurologic Exam: Mental Status: Alert, oriented to place only.  Speech fluent without evidence of aphasia. Able to follow simple 1 step commands without difficulty. Cranial Nerves: II-Visual fields grossly intact. III/IV/VI-Extraocular movements intact.  Pupils reactive bilaterally. Ptosis not present. V/VII-Smile symmetric VIII-grossly intact IX/X-normal gag XI-bilateral shoulder shrug XII-midline tongue extension Motor: 4/5 bilaterally with normal tone and bulk Sensory: Pinprick and light touch intact throughout, bilaterally Deep Tendon Reflexes: 2+bilateral UE and 1+ in patella, no achilles reflex bilateral. symmetric throughout.     Plantars:      Right:  Down going     Left:   Up going Cerebellar: Finger-to-nose dysmetric bilateral,   Lab Results: Basic Metabolic Panel:  Lab 10/25/11 1610 10/23/11 1548 10/23/11 0500 10/22/11 0340  NA 140 144 143 142  K 4.0 3.8 3.5 3.2*  CL 103 106 106 105  CO2 26 28 29 29   GLUCOSE 107* 106* 106* 101*  BUN 14 10 7  5*  CREATININE 0.92 0.98 0.89 0.90  CALCIUM 9.1 9.1 8.8 --  MG -- -- -- --  PHOS -- -- -- --    Liver Function Tests:  Lab 10/23/11 1548  AST 72*  ALT 48  ALKPHOS 85  BILITOT 0.4  PROT 6.8  ALBUMIN 2.9*   No  results found for this basename: LIPASE:5,AMYLASE:5 in the last 168 hours  Lab 10/23/11 1559  AMMONIA 21    CBC:  Lab 10/23/11 2154 10/23/11 1548  WBC 7.8 8.2  NEUTROABS 6.3 5.9  HGB 11.4* 12.4*  HCT 33.5* 36.8*  MCV 85.7 84.8  PLT 227 242    Cardiac Enzymes: No results found for this basename: CKTOTAL:5,CKMB:5,CKMBINDEX:5,TROPONINI:5 in the last 168 hours  Lipid Panel: No results found for this basename: CHOL:5,TRIG:5,HDL:5,CHOLHDL:5,VLDL:5,LDLCALC:5 in the last 168 hours  CBG: No results found for this basename: GLUCAP:5 in the last 168 hours  Microbiology: Results for orders placed during the hospital encounter of 10/17/11  CSF CULTURE     Status: Normal   Collection Time   10/17/11 12:25 PM      Component Value Range Status Comment   Specimen Description CSF   Final    Special Requests FROM SHUNT IN CUP   Final    Gram Stain     Final    Value: CYTOSPIN WBC PRESENT, PREDOMINANTLY MONONUCLEAR     NO ORGANISMS SEEN     Performed at [facility]   Culture NO GROWTH 3 DAYS   Final    Report Status 10/21/2011 FINAL   Final   GRAM STAIN     Status: Normal   Collection Time   10/17/11 12:25 PM      Component  Value Range Status Comment   Specimen Description CSF   Final    Special Requests FROM SHUNT IN CUP   Final    Gram Stain     Final    Value: CYTOSPIN SLIDE:     WBC PRESENT, PREDOMINANTLY MONONUCLEAR     NO ORGANISMS SEEN   Report Status 10/17/2011 FINAL   Final   MRSA PCR SCREENING     Status: Normal   Collection Time   10/17/11  3:17 PM      Component Value Range Status Comment   MRSA by PCR NEGATIVE  NEGATIVE Final   CULTURE, BLOOD (ROUTINE X 2)     Status: Normal (Preliminary result)   Collection Time   10/23/11 10:00 PM      Component Value Range Status Comment   Specimen Description BLOOD LEFT ARM   Final    Special Requests     Final    Value: BOTTLES DRAWN AEROBIC AND ANAEROBIC 7CC AEROBIC 5CC ANAEROBIC   Culture  Setup Time 10/24/2011 05:04    Final    Culture     Final    Value:        BLOOD CULTURE RECEIVED NO GROWTH TO DATE CULTURE WILL BE HELD FOR 5 DAYS BEFORE ISSUING A FINAL NEGATIVE REPORT   Report Status PENDING   Incomplete   CULTURE, BLOOD (ROUTINE X 2)     Status: Normal (Preliminary result)   Collection Time   10/23/11 10:10 PM      Component Value Range Status Comment   Specimen Description BLOOD LEFT ARM   Final    Special Requests BOTTLES DRAWN AEROBIC ONLY 5CC   Final    Culture  Setup Time 10/24/2011 05:04   Final    Culture     Final    Value:        BLOOD CULTURE RECEIVED NO GROWTH TO DATE CULTURE WILL BE HELD FOR 5 DAYS BEFORE ISSUING A FINAL NEGATIVE REPORT   Report Status PENDING   Incomplete   URINE CULTURE     Status: Normal   Collection Time   10/24/11  1:27 AM      Component Value Range Status Comment   Specimen Description URINE, CATHETERIZED   Final    Special Requests NONE   Final    Culture  Setup Time 10/24/2011 02:02   Final    Colony Count NO GROWTH   Final    Culture NO GROWTH   Final    Report Status 10/24/2011 FINAL   Final     Coagulation Studies: No results found for this basename: LABPROT:5,INR:5 in the last 72 hours  Imaging: No results found.No results found for this or any previous visit (from the past 48 hour(s)).  Recent Results (from the past 240 hour(s))  CULTURE, BLOOD (ROUTINE X 2)     Status: Normal (Preliminary result)   Collection Time   10/23/11 10:00 PM      Component Value Range Status Comment   Specimen Description BLOOD LEFT ARM   Final    Special Requests     Final    Value: BOTTLES DRAWN AEROBIC AND ANAEROBIC 7CC AEROBIC 5CC ANAEROBIC   Culture  Setup Time 10/24/2011 05:04   Final    Culture     Final    Value:        BLOOD CULTURE RECEIVED NO GROWTH TO DATE CULTURE WILL BE HELD FOR 5 DAYS BEFORE ISSUING A FINAL NEGATIVE REPORT  Report Status PENDING   Incomplete   CULTURE, BLOOD (ROUTINE X 2)     Status: Normal (Preliminary result)   Collection Time    10/23/11 10:10 PM      Component Value Range Status Comment   Specimen Description BLOOD LEFT ARM   Final    Special Requests BOTTLES DRAWN AEROBIC ONLY 5CC   Final    Culture  Setup Time 10/24/2011 05:04   Final    Culture     Final    Value:        BLOOD CULTURE RECEIVED NO GROWTH TO DATE CULTURE WILL BE HELD FOR 5 DAYS BEFORE ISSUING A FINAL NEGATIVE REPORT   Report Status PENDING   Incomplete   URINE CULTURE     Status: Normal   Collection Time   10/24/11  1:27 AM      Component Value Range Status Comment   Specimen Description URINE, CATHETERIZED   Final    Special Requests NONE   Final    Culture  Setup Time 10/24/2011 02:02   Final    Colony Count NO GROWTH   Final    Culture NO GROWTH   Final    Report Status 10/24/2011 FINAL   Final      Medications:  Scheduled:   . chlorhexidine  15 mL Mouth Rinse BID  . docusate sodium  100 mg Oral BID  . folic acid  1 mg Oral Daily  . nicotine  14 mg Transdermal Q24H  . pantoprazole  40 mg Oral QHS  . potassium chloride  40 mEq Oral BID  . QUEtiapine  25 mg Oral BID  . risperiDONE  1 mg Oral BID  . thiamine  100 mg Oral Daily    Assessment/Plan:  Status post ventriculoperitoneal shunt replacement with overall improved mental status compared to patient's level of confusion and dysfunction on admission. Patient's baseline cognitive as well as motor coordination and gait stability are unknown. Extent and duration of worsening hydrocephalus associated with shunt dysfunction is unknown  Patient is slowly improving with mental status however his baseline is unknown.   EEG showed posterior background slowing but no epileptiform activity. TSH was 2.497, RPR was negative. Patient remains afebrile    Recommend:  1. Continue with Seroquel 25 mg BID  2. SNF placement at discharge   Neurology will Sign off.    LOS: 11 days   Felicie Morn PA-C [facility] [phone]  10/28/2011, 10:02 AM

## 2011-10-28 NOTE — Progress Notes (Signed)
Occupational Therapy Treatment Patient Details Name: Youssouf Shipley MRN: [id number] DOB: June 29, 1950 Today's Date: 10/28/2011 Time: 7846-9629 OT Time Calculation (min): 26 min  OT Assessment / Plan / Recommendation Comments on Treatment Session Pt progressing with therapy. However, pt requires constant cues for re-orientation and is not aware of any deficits other than feeling "weak" Will benefit from continued OT    Follow Up Recommendations  Skilled nursing facility    Barriers to Discharge       Equipment Recommendations  Defer to next venue    Recommendations for Other Services    Frequency     Plan Discharge plan remains appropriate    Precautions / Restrictions Precautions Precautions: Fall Restrictions Weight Bearing Restrictions: No   Pertinent Vitals/Pain Pt with no c/o pain during session    ADL  Grooming: Simulated;Moderate assistance Where Assessed - Grooming: Supported sitting Lower Body Dressing: Performed;Maximal assistance Where Assessed - Lower Body Dressing: Supported sit to Pharmacist, hospital: Chief of Staff: Patient Percentage: 50% Statistician Method: Sit to Barista:  (from bed) Toileting - Clothing Manipulation and Hygiene: Simulated;Moderate assistance Where Assessed - Toileting Clothing Manipulation and Hygiene: Standing Equipment Used: Gait belt Transfers/Ambulation Related to ADLs: Unable to perform ambulation secondary to pt's cognition and ataxia ADL Comments: Pt requires constant re-orientation    OT Diagnosis:    OT Problem List:   OT Treatment Interventions:     OT Goals ADL Goals Pt Will Transfer to Toilet: with mod assist;Stand pivot transfer;with DME;3-in-1 ADL Goal: Toilet Transfer - Progress: Progressing toward goals Miscellaneous OT Goals Miscellaneous OT Goal #1: Pt will perform bed mobility with supervision in prep for EOB ADLs. OT Goal: Miscellaneous Goal #1 -  Progress: Updated due to goal met Miscellaneous OT Goal #2: Pt will perform static sitting balance with supervision >5 min in prep for functional transfer. OT Goal: Miscellaneous Goal #2 - Progress: Progressing toward goals Miscellaneous OT Goal #3: Pt will attend to right side with min verbal cueing 75% of time. OT Goal: Miscellaneous Goal #3 - Progress: Progressing toward goals  Visit Information  Last OT Received On: 10/28/11 Assistance Needed: +2 PT/OT Co-Evaluation/Treatment: Yes    Subjective Data      Prior Functioning       Cognition  Overall Cognitive Status: Impaired Area of Impairment: Attention;Memory;Following commands;Safety/judgement;Awareness of deficits Arousal/Alertness: Awake/alert Orientation Level: Disoriented to;Time;Situation Behavior During Session: [facility] for tasks performed Current Attention Level: Sustained Attention - Other Comments: 20 seconds Memory Deficits: Patient unable to recall surgery or events after surgery. Needs cueing to educate on surgical procedure throughout session Following Commands: Follows one step commands inconsistently Safety/Judgement: Decreased awareness of safety precautions;Decreased safety judgement for tasks assessed;Decreased awareness of need for assistance Safety/Judgement - Other Comments: Patient stated he feels as if he could walk but he would be tired when it was over. Cognition - Other Comments: Patient with overall poor memory and recall. Requires constant cues for task at hand.     Mobility Bed Mobility Supine to Sit: 4: Min assist;With rails Sitting - Scoot to Edge of Bed: 4: Min assist Sit to Supine: 4: Min assist Details for Bed Mobility Assistance: A for shoulders up out of bed once patient reached "half-up" position due to posterior lean. A for positioning back into bed Transfers Sit to Stand: 1: +2 Total assist;From bed;With upper extremity assist Sit to Stand: Patient Percentage: 50% Stand to Sit: To bed;1:  +2 Total assist;With upper extremity assist Stand to  Sit: Patient Percentage: 50% Details for Transfer Assistance: A to block bilateral LEs as they became very ataxic and buckling with stand. Manual facilitation required to acheive hip extension with upright posture. Unable to transfer at this time   Exercises    Balance Static Sitting Balance Static Sitting - Balance Support: Feet supported;Bilateral upper extremity supported Static Sitting - Level of Assistance: 3: Mod assist (down to stand by assist) Static Sitting - Comment/# of Minutes: cues and physical assist to help pt find and maintain body at midline as pt with right lateral and posterior lean.   End of Session OT - End of Session Equipment Utilized During Treatment: Gait belt Activity Tolerance: Patient tolerated treatment well Patient left: in bed;with call bell/phone within reach;with bed alarm set Nurse Communication: Mobility status  GO     Sol Englert 10/28/2011, 4:04 PM

## 2011-10-28 NOTE — Progress Notes (Signed)
Overall stable. Her still remains confused but nonfocal otherwise. Wounds clean and dry. Patient afebrile. Patient will need SNIF placement.

## 2011-10-28 NOTE — Progress Notes (Signed)
Physical Therapy Treatment Patient Details Name: Kurt Walton MRN: [id number] DOB: [birth year] Today's Date: 10/28/2011 Time: 7829-5621 PT Time Calculation (min): 28 min  PT Assessment / Plan / Recommendation Comments on Treatment Session  Patient very compliant and agreeable to work with therapy. Patient limited by overall cognitive impairments and LE weakness. Noted patient fell this AM so patient was returned to bed at end of session vs sitting up in recliner.     Follow Up Recommendations  Skilled nursing facility    Barriers to Discharge        Equipment Recommendations  Defer to next venue    Recommendations for Other Services    Frequency Min 3X/week   Plan Discharge plan remains appropriate;Frequency remains appropriate    Precautions / Restrictions Precautions Precautions: Fall Restrictions Weight Bearing Restrictions: No   Pertinent Vitals/Pain     Mobility  Bed Mobility Supine to Sit: 4: Min assist;With rails Sitting - Scoot to Edge of Bed: 4: Min assist Sit to Supine: 4: Min assist Details for Bed Mobility Assistance: A for shoulders up out of bed once patient reached "half-up" position due to posterior lean. A for positioning back into bed Transfers Transfers: Sit to Stand;Stand to Sit Sit to Stand: 1: +2 Total assist;From bed;With upper extremity assist Sit to Stand: Patient Percentage: 50% Stand to Sit: To bed;1: +2 Total assist;With upper extremity assist Stand to Sit: Patient Percentage: 50% Details for Transfer Assistance: A to block bilateral LEs as they became very ataxic and buckling with stand. Manual facilitation required to acheive hip extension with upright posture. Unable to transfer at this time    Exercises     PT Diagnosis:    PT Problem List:   PT Treatment Interventions:     PT Goals Acute Rehab PT Goals PT Goal: Supine/Side to Sit - Progress: Progressing toward goal PT Goal: Sit at Edge Of Bed - Progress: Progressing toward goal PT  Goal: Sit to Supine/Side - Progress: Progressing toward goal PT Goal: Sit to Stand - Progress: Progressing toward goal PT Goal: Stand to Sit - Progress: Progressing toward goal  Visit Information  Last PT Received On: 10/28/11 Assistance Needed: +2    Subjective Data  Subjective: I need to go back to work, I was planning on retiring   Cognition  Overall Cognitive Status: Impaired Area of Impairment: Attention;Memory;Following commands;Safety/judgement;Awareness of deficits Arousal/Alertness: Awake/alert Orientation Level: Disoriented to;Time;Situation Behavior During Session: [facility] for tasks performed Current Attention Level: Sustained Attention - Other Comments: 20 seconds Memory Deficits: Patient unable to recall surgery or events after surgery. Needs cueing to educate on surgical procedure throughout session Following Commands: Follows one step commands inconsistently Safety/Judgement: Decreased awareness of safety precautions;Decreased safety judgement for tasks assessed;Decreased awareness of need for assistance Safety/Judgement - Other Comments: Patient stated he feels as if he could walk but he would be tired when it was over. Cognition - Other Comments: Patient with overall poor memory and recall. Requires constant cues for task assessed    Balance  Static Sitting Balance Static Sitting - Balance Support: Feet supported;Bilateral upper extremity supported Static Sitting - Level of Assistance: 5: Stand by assistance Static Sitting - Comment/# of Minutes: cues for midline required as patient list towards right Dynamic Sitting Balance Dynamic Sitting - Balance Support: Left upper extremity supported;Right upper extremity supported;No upper extremity supported;Feet supported Dynamic Sitting - Level of Assistance: 4: Min assist;3: Mod assist Dynamic Sitting Balance - Compensations: Patient working towards midline. Using alot of trunk strength in  order to hold midline for seconds  only Dynamic Sitting - Balance Activities: Lateral lean/weight shifting;Forward lean/weight shifting;Reaching for objects;Trunk control activities;Head control activities Dynamic Sitting - Comments: ~ 15 mins with up to Mod A to correct midline and posterior lean.  Static Standing Balance Static Standing - Balance Support: Bilateral upper extremity supported Static Standing - Level of Assistance: 1: +2 Total assist Static Standing - Comment/# of Minutes: Attempt side stepping however patient unable to initiate without 2 assist.   End of Session PT - End of Session Equipment Utilized During Treatment: Gait belt Activity Tolerance: Patient tolerated treatment well Patient left: in bed;with call bell/phone within reach;with bed alarm set Nurse Communication: Mobility status   GP     Fredrich Birks 10/28/2011, 10:27 AM 10/28/2011 Fredrich Birks PTA [phone] pager [phone] office

## 2011-10-28 NOTE — Progress Notes (Signed)
Patient fell from chair, no injuries noted, vitals stable, notified physician, family.  In bed side rails x 3, bed alarm on. Will continue to monitor Kurt Walton.

## 2011-10-29 NOTE — Discharge Summary (Signed)
Physician Discharge Summary  Patient ID: Kurt Walton MRN: [id number] DOB/AGE: November 09, 1950 61 y.o.  Admit date: 10/17/2011 Discharge date: 10/29/2011  Admission Diagnoses:  Discharge Diagnoses:  Principal Problem:  *Hydrocephalus Active Problems:  Delirium  Encephalopathy acute  Altered mental status  HTN (hypertension)  Hypernatremia  Oliguria   Discharged Condition: fair  Hospital Course: Patient admitted for treatment of a chronically malfunctioning ventriculoperitoneal shunt. Patient was taken the operating room where he underwent uncomplicated ventricular peritoneal shunt replacement. Postoperatively he did more awake and interactive but significantly confused. Workup is not demonstrating new problems. Our suspicion is that his confusion is relative to environmental factors and equilibration of his injury of pressure after his shunt. There's not felt that anything further need be done for treatment or workup. Patient's felt to be stable for discharge to skilled nursing facility for further convalescence.  Consults:   Significant Diagnostic Studies:   Treatments:   Discharge Exam: Blood pressure 121/77, pulse 86, temperature 98.3 F (36.8 C), temperature source Oral, resp. rate 18, height 5\' 9"  (1.753 m), weight 79.9 kg (176 lb 2.4 oz), SpO2 100.00%. Awake and alert. Oriented times name only. Speech fluent. Motor and sensory examination 5 over 5 bilaterally. Wounds clean dry and intact. Chest abdomen benign.  Disposition: Final discharge disposition not confirmed   Medication List  As of 10/29/2011  1:34 PM   TAKE these medications         aspirin 81 MG chewable tablet   Chew 81 mg by mouth daily.           Follow-up Information    Follow up with Cristi Loron, MD. Call in 1 month.   Contact information:   [address], [address] [place] [address] [phone]          Signed: Temple Pacini 10/29/2011, 1:34 PM

## 2011-10-29 NOTE — Clinical Social Work Note (Signed)
Pt is ready for discharge today to [facility]. Facility has received discharge summary and is ready to admit pt. [facility]'s admissions coordinator, Joni Reining, is agreeable to receiving Letter of Guarantee on 10/30/2011 with services starting 10/29/2011. CSW will follow up with CSW Dept Director for Letter of Guarantee. Family is agreeable to discharge plan. [facility] will provide transportation to facility.   Dede Query, MSW, Theresia Majors [phone]

## 2011-10-30 LAB — CULTURE, BLOOD (ROUTINE X 2): Culture: NO GROWTH

## 2012-02-05 ENCOUNTER — Emergency Department (HOSPITAL_COMMUNITY): Payer: Medicaid Other

## 2012-02-05 ENCOUNTER — Encounter (HOSPITAL_COMMUNITY): Payer: Self-pay | Admitting: *Deleted

## 2012-02-05 ENCOUNTER — Observation Stay (HOSPITAL_COMMUNITY)
Admission: EM | Admit: 2012-02-05 | Discharge: 2012-02-10 | Disposition: A | Discharge status: Skilled Nursing Facility | Payer: Medicaid Other | Attending: Internal Medicine | Admitting: Internal Medicine

## 2012-02-05 DIAGNOSIS — Z79899 Other long term (current) drug therapy: Secondary | ICD-10-CM | POA: Insufficient documentation

## 2012-02-05 DIAGNOSIS — F29 Unspecified psychosis not due to a substance or known physiological condition: Secondary | ICD-10-CM | POA: Insufficient documentation

## 2012-02-05 DIAGNOSIS — R4182 Altered mental status, unspecified: Principal | ICD-10-CM | POA: Diagnosis present

## 2012-02-05 DIAGNOSIS — R4789 Other speech disturbances: Secondary | ICD-10-CM

## 2012-02-05 DIAGNOSIS — R410 Disorientation, unspecified: Secondary | ICD-10-CM | POA: Diagnosis present

## 2012-02-05 DIAGNOSIS — G9389 Other specified disorders of brain: Secondary | ICD-10-CM | POA: Insufficient documentation

## 2012-02-05 DIAGNOSIS — Z7982 Long term (current) use of aspirin: Secondary | ICD-10-CM | POA: Insufficient documentation

## 2012-02-05 DIAGNOSIS — R34 Anuria and oliguria: Secondary | ICD-10-CM

## 2012-02-05 DIAGNOSIS — E870 Hyperosmolality and hypernatremia: Secondary | ICD-10-CM

## 2012-02-05 DIAGNOSIS — R531 Weakness: Secondary | ICD-10-CM

## 2012-02-05 DIAGNOSIS — G919 Hydrocephalus, unspecified: Secondary | ICD-10-CM

## 2012-02-05 DIAGNOSIS — E876 Hypokalemia: Secondary | ICD-10-CM | POA: Diagnosis present

## 2012-02-05 DIAGNOSIS — R404 Transient alteration of awareness: Secondary | ICD-10-CM | POA: Insufficient documentation

## 2012-02-05 DIAGNOSIS — G319 Degenerative disease of nervous system, unspecified: Secondary | ICD-10-CM | POA: Insufficient documentation

## 2012-02-05 DIAGNOSIS — R270 Ataxia, unspecified: Secondary | ICD-10-CM

## 2012-02-05 DIAGNOSIS — I10 Essential (primary) hypertension: Secondary | ICD-10-CM | POA: Diagnosis present

## 2012-02-05 DIAGNOSIS — G9340 Encephalopathy, unspecified: Secondary | ICD-10-CM

## 2012-02-05 DIAGNOSIS — R4781 Slurred speech: Secondary | ICD-10-CM

## 2012-02-05 DIAGNOSIS — R279 Unspecified lack of coordination: Secondary | ICD-10-CM

## 2012-02-05 LAB — URINALYSIS, ROUTINE W REFLEX MICROSCOPIC
Bilirubin Urine: NEGATIVE
Glucose, UA: NEGATIVE mg/dL
Hgb urine dipstick: NEGATIVE
Ketones, ur: NEGATIVE mg/dL
Nitrite: NEGATIVE
Specific Gravity, Urine: 1.037 — ABNORMAL HIGH (ref 1.005–1.030)
pH: 5.5 (ref 5.0–8.0)

## 2012-02-05 LAB — COMPREHENSIVE METABOLIC PANEL
ALT: 26 U/L (ref 0–53)
AST: 51 U/L — ABNORMAL HIGH (ref 0–37)
Alkaline Phosphatase: 84 U/L (ref 39–117)
Calcium: 9.2 mg/dL (ref 8.4–10.5)
Potassium: 3.1 mEq/L — ABNORMAL LOW (ref 3.5–5.1)
Sodium: 145 mEq/L (ref 135–145)
Total Protein: 8 g/dL (ref 6.0–8.3)

## 2012-02-05 LAB — RAPID URINE DRUG SCREEN, HOSP PERFORMED
Amphetamines: NOT DETECTED
Barbiturates: NOT DETECTED
Benzodiazepines: NOT DETECTED
Cocaine: NOT DETECTED
Opiates: NOT DETECTED
Tetrahydrocannabinol: NOT DETECTED

## 2012-02-05 LAB — CBC
Hemoglobin: 13.4 g/dL (ref 13.0–17.0)
MCH: 28.6 pg (ref 26.0–34.0)
MCHC: 34.4 g/dL (ref 30.0–36.0)
Platelets: 200 10*3/uL (ref 150–400)
RBC: 4.68 MIL/uL (ref 4.22–5.81)

## 2012-02-05 LAB — GLUCOSE, CAPILLARY: Glucose-Capillary: 136 mg/dL — ABNORMAL HIGH (ref 70–99)

## 2012-02-05 MED ORDER — TRAMADOL HCL 50 MG PO TABS
50.0000 mg | ORAL_TABLET | Freq: Four times a day (QID) | ORAL | Status: DC | PRN
  Administered 2012-02-10: 50 mg via ORAL
  Filled 2012-02-05: qty 1

## 2012-02-05 MED ORDER — ASPIRIN 81 MG PO CHEW
81.0000 mg | CHEWABLE_TABLET | Freq: Every day | ORAL | Status: DC
  Administered 2012-02-06 – 2012-02-10 (×5): 81 mg via ORAL
  Filled 2012-02-05 (×5): qty 1

## 2012-02-05 MED ORDER — SODIUM CHLORIDE 0.9 % IV SOLN
INTRAVENOUS | Status: DC
  Administered 2012-02-05: 19:00:00 via INTRAVENOUS

## 2012-02-05 MED ORDER — HEPARIN SODIUM (PORCINE) 5000 UNIT/ML IJ SOLN
5000.0000 [IU] | Freq: Three times a day (TID) | INTRAMUSCULAR | Status: DC
  Administered 2012-02-06 – 2012-02-10 (×15): 5000 [IU] via SUBCUTANEOUS
  Filled 2012-02-05 (×17): qty 1

## 2012-02-05 MED ORDER — POTASSIUM CHLORIDE CRYS ER 20 MEQ PO TBCR
40.0000 meq | EXTENDED_RELEASE_TABLET | Freq: Once | ORAL | Status: AC
  Administered 2012-02-05: 40 meq via ORAL
  Filled 2012-02-05: qty 2

## 2012-02-05 MED ORDER — ADULT MULTIVITAMIN W/MINERALS CH
1.0000 | ORAL_TABLET | Freq: Every day | ORAL | Status: DC
  Administered 2012-02-06 – 2012-02-10 (×5): 1 via ORAL
  Filled 2012-02-05 (×5): qty 1

## 2012-02-05 NOTE — ED Notes (Signed)
Provided urinal for patient. Patient unable to provide urine sample at this time.

## 2012-02-05 NOTE — ED Notes (Signed)
Pt family reports that pt has been altered x3 days. 3 days ago would walk with his walker. At present unable to bear weight. pts speech is slurred. Family reports pt has been confused. Pt went to dr today and was sent to ED.

## 2012-02-05 NOTE — ED Notes (Signed)
Family refused to allow to ambulate patient without a walker.

## 2012-02-05 NOTE — H&P (Signed)
[facility] History and Physical  Kurt Walton AOZ:[id number] DOB: Nov 03, 1950 DOA: 02/05/2012  Referring physician: ED PCP: No primary provider on file.  Specialists: None  Chief Complaint: AMS  HPI: Kurt Walton is a 61 y.o. male who presents with new onset slurred speech, mild confusion and inability to walk due to loss of ballance for the past 3 days.  At baseline he does have significant discordination and walks with a walker.  Symptoms onset about 48 hours ago per family, was last seen normal prior to that, patient lives alone.  Nothing seems to make them better nor worse.  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  Review of Systems: 12 systems reviewed and negative.  History reviewed. No pertinent past medical history. Past Surgical History  Procedure Date  . Ventriculoperitoneal shunt 10/17/2011    Procedure: SHUNT INSERTION VENTRICULAR-PERITONEAL;  Surgeon: Cristi Loron, MD;  Location: [facility];  Service: Neurosurgery;  Laterality: Right;  Instertion of Ventricular-Peritoneal Shunt   Social History:  reports that he has been smoking.  He does not have any smokeless tobacco history on file. He reports that he drinks alcohol. His drug history not on file. Patient lives at home  No Known Allergies  History reviewed. No pertinent family history.   Prior to Admission medications   Medication Sig Start Date End Date Taking? Authorizing Provider  aspirin 81 MG chewable tablet Chew 81 mg by mouth daily.   Yes Historical Provider, MD  Multiple Vitamin (MULTIVITAMIN WITH MINERALS) TABS Take 1 tablet by mouth daily.   Yes Historical Provider, MD  traMADol (ULTRAM) 50 MG tablet Take 50 mg by mouth every 6 (six) hours as needed. For pain.   Yes Historical Provider, MD   Physical Exam: Filed Vitals:   02/05/12 1831 02/05/12 2000 02/05/12 2135  BP: 157/79 125/79 134/75  Pulse: 70 65 70  Temp: 98.9 F (37.2  C)    TempSrc: Oral    Resp: 15 17 16   SpO2: 100% 100% 99%    General:  NAD, resting comfortably in bed Eyes: PEERLA EOMI ENT: mucous membranes moist Neck: supple w/o JVD Cardiovascular: RRR w/o MRG Respiratory: CTA B Abdomen: soft, nt, nd, bs+ Skin: no rash nor lesion Musculoskeletal: MAE, full ROM all 4 extremities Psychiatric: normal tone and affect Neurologic: AAOx3 possibly some mild L sided facial droop, strength 5/5 BUE 5/5 RLE, 4+/5 LLE, the patient does have significant dysmetria especially on his L side which family states is much worse than baseline.  Labs on Admission:  Basic Metabolic Panel:  Lab 02/05/12 9629  NA 145  K 3.1*  CL 108  CO2 24  GLUCOSE 125*  BUN 20  CREATININE 1.05  CALCIUM 9.2  MG --  PHOS --   Liver Function Tests:  Lab 02/05/12 1825  AST 51*  ALT 26  ALKPHOS 84  BILITOT 0.1*  PROT 8.0  ALBUMIN 3.7   No results found for this basename: LIPASE:5,AMYLASE:5 in the last 168 hours No results found for this basename: AMMONIA:5 in the last 168 hours CBC:  Lab 02/05/12 1825  WBC 7.5  NEUTROABS --  HGB 13.4  HCT 39.0  MCV 83.3  PLT 200   Cardiac Enzymes: No results found for this basename: CKTOTAL:5,CKMB:5,CKMBINDEX:5,TROPONINI:5 in the last 168 hours  BNP (last 3 results) No results found for this basename: PROBNP:3 in the last 8760 hours CBG:  Lab 02/05/12 1823  GLUCAP 136*  Radiological Exams on Admission: Ct Head Wo Contrast  02/05/2012  *RADIOLOGY REPORT*  Clinical Data: Altered mental status for 3 days.  Slurred speech. Fusion.  CT HEAD WITHOUT CONTRAST  Technique:  Contiguous axial images were obtained from the base of the skull through the vertex without contrast.  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  Shunt tube within the posterior subarachnoid space is unchanged with chronic cystic change within the bone.  Second shunt  tube enters the occipital horn of the right lateral ventricle.  Third shunt tube enters the occipital horn of the right lateral ventricle and crosses the midline into the left lateral ventricle.  All these appear unchanged.  Ventricular size is stable.  No evidence of shunt malfunction.  No evidence of acute infarction, hemorrhage or extra- axial collection.  The calvarium is unremarkable.  Sinuses, middle ears and mastoids are clear.  IMPRESSION: No change since the previous study.  No evidence of shunt malfunction.  No evidence of acute stroke or other acute intracranial process.   Original Report Authenticated By: Paulina Fusi, M.D.     EKG: Independently reviewed.  Assessment/Plan Principal Problem:  *Altered mental status Active Problems:  Delirium   1. AMS - no evidence of new CVA on CT scan and I would have expected it to show up as such this long after symptoms have started.  Cannot obtain MRI/MRA of brain per family because of "his shunt" it is worth noting that his shunt he had placed at age 62 is still in place so it may be that this would not be an MRI safe device?  Regardless will hold off on obtaining MRI at this point.  Cause of AMS remains clear.  Observing patient for now, may benefit from neurology consultation tomorrow but will leave this up to day team to decide. 2. Delerium - suspicious that his AMS may be due to another medical problem, UTI pending, no other evidence of infection.  No consults obtained at this time  Code Status: Full Code (must indicate code status--if unknown or must be presumed, indicate so) Family Communication: Spoke with daughter and X-Wife at bedside (indicate person spoken with, if applicable, with phone number if by telephone) Disposition Plan: Admit to obs (indicate anticipated LOS)  Time spent: 70 min  Harlan Vinal M. [facility] Pager [phone]  If 7PM-7AM, please contact night-coverage [email or web address] Password [email or web address] 02/05/2012, 11:12  PM

## 2012-02-05 NOTE — ED Provider Notes (Addendum)
History     CSN: [id number]  Arrival date & time 02/05/12  1810   First MD Initiated Contact with Patient 02/05/12 1836      Chief Complaint  Patient presents with  . Altered Mental Status    (Consider location/radiation/quality/duration/timing/severity/associated sxs/prior treatment) Patient is a 61 y.o. male presenting with altered mental status. The history is provided by the patient and a relative. The history is limited by the condition of the patient.  Altered Mental Status  family notes increased confusion, slurred speech, and unable to walk for past 3 days. At baseline walks slowly w walker, hoh, but able to attend to adls.  Symptoms constant. Pt states feels fine, denies any symptoms, ?confusion - level 5 caveat. w symptoms, no specific exacerbating or alleviating factors. Constant. Family notes hx brain aneurysm at age 44 w vp shunt, states shunt was replaced several months ago. No fever or chills. Eating normally. No recent head injury.   History reviewed. No pertinent past medical history.  Past Surgical History  Procedure Date  . Ventriculoperitoneal shunt 10/17/2011    Procedure: SHUNT INSERTION VENTRICULAR-PERITONEAL;  Surgeon: Cristi Loron, MD;  Location: [facility];  Service: Neurosurgery;  Laterality: Right;  Instertion of Ventricular-Peritoneal Shunt    History reviewed. No pertinent family history.  History  Substance Use Topics  . Smoking status: Current Every Day Smoker -- 1.0 packs/day  . Smokeless tobacco: Not on file  . Alcohol Use: Yes      Review of Systems  Unable to perform ROS: Mental status change  Psychiatric/Behavioral: Positive for altered mental status.  level 5 caveat - pt w confusion, denies any c/o.   Allergies  Review of patient's allergies indicates no known allergies.  Home Medications   Current Outpatient Rx  Name  Route  Sig  Dispense  Refill  . ASPIRIN 81 MG PO CHEW   Oral   Chew 81 mg by mouth daily.         .  ADULT MULTIVITAMIN W/MINERALS CH   Oral   Take 1 tablet by mouth daily.         . TRAMADOL HCL 50 MG PO TABS   Oral   Take 50 mg by mouth every 6 (six) hours as needed. For pain.           BP 157/79  Pulse 70  Temp 98.9 F (37.2 C) (Oral)  Resp 15  SpO2 100%  Physical Exam  Nursing note and vitals reviewed. Constitutional: He appears well-developed and well-nourished. No distress.  HENT:  Head: Atraumatic.  Eyes: Conjunctivae normal are normal. Pupils are equal, round, and reactive to light.  Neck: Neck supple. No tracheal deviation present.       No bruit  Cardiovascular: Normal rate, regular rhythm, normal heart sounds and intact distal pulses.   Pulmonary/Chest: Effort normal and breath sounds normal. No accessory muscle usage. No respiratory distress.  Abdominal: Soft. Bowel sounds are normal. He exhibits no distension. There is no tenderness.  Genitourinary:       No cva tenderness  Musculoskeletal: Normal range of motion. He exhibits no edema and no tenderness.  Neurological: He is alert. No cranial nerve deficit.       Alert, smiling, content. Oriented to person, place. Speech dysarthric. Motor 5/5 bil. No pronator drift. Impaired finger to nose/heel to shin. Stands at bedside w assist, unable to walk, leaning to one side then other.   Skin: Skin is warm and dry. He is not  diaphoretic.  Psychiatric: He has a normal mood and affect.    ED Course  Procedures (including critical care time)  Labs Reviewed  GLUCOSE, CAPILLARY - Abnormal; Notable for the following:    Glucose-Capillary 136 (*)     All other components within normal limits  CBC  COMPREHENSIVE METABOLIC PANEL  URINALYSIS, ROUTINE W REFLEX MICROSCOPIC  ETHANOL  URINE RAPID DRUG SCREEN (HOSP PERFORMED)    Results for orders placed during the hospital encounter of 02/05/12  GLUCOSE, CAPILLARY      Component Value Range   Glucose-Capillary 136 (*) 70 - 99 mg/dL   Comment 1 Notify RN    CBC       Component Value Range   WBC 7.5  4.0 - 10.5 K/uL   RBC 4.68  4.22 - 5.81 MIL/uL   Hemoglobin 13.4  13.0 - 17.0 g/dL   HCT 16.1  09.6 - 04.5 %   MCV 83.3  78.0 - 100.0 fL   MCH 28.6  26.0 - 34.0 pg   MCHC 34.4  30.0 - 36.0 g/dL   RDW 40.9  81.1 - 91.4 %   Platelets 200  150 - 400 K/uL  COMPREHENSIVE METABOLIC PANEL      Component Value Range   Sodium 145  135 - 145 mEq/L   Potassium 3.1 (*) 3.5 - 5.1 mEq/L   Chloride 108  96 - 112 mEq/L   CO2 24  19 - 32 mEq/L   Glucose, Bld 125 (*) 70 - 99 mg/dL   BUN 20  6 - 23 mg/dL   Creatinine, Ser 7.82  0.50 - 1.35 mg/dL   Calcium 9.2  8.4 - 95.6 mg/dL   Total Protein 8.0  6.0 - 8.3 g/dL   Albumin 3.7  3.5 - 5.2 g/dL   AST 51 (*) 0 - 37 U/L   ALT 26  0 - 53 U/L   Alkaline Phosphatase 84  39 - 117 U/L   Total Bilirubin 0.1 (*) 0.3 - 1.2 mg/dL   GFR calc non Af Amer 74 (*) >90 mL/min   GFR calc Af Amer 86 (*) >90 mL/min  ETHANOL      Component Value Range   Alcohol, Ethyl (B) <11  0 - 11 mg/dL   Ct Head Wo Contrast  02/05/2012  *RADIOLOGY REPORT*  Clinical Data: Altered mental status for 3 days.  Slurred speech. Fusion.  CT HEAD WITHOUT CONTRAST  Technique:  Contiguous axial images were obtained from the base of the skull through the vertex without contrast.  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  Shunt tube within the posterior subarachnoid space is unchanged with chronic cystic change within the bone.  Second shunt tube enters the occipital horn of the right lateral ventricle.  Third shunt tube enters the occipital horn of the right lateral ventricle and crosses the midline into the left lateral ventricle.  All these appear unchanged.  Ventricular size is stable.  No evidence of shunt malfunction.  No evidence of acute infarction, hemorrhage or extra- axial collection.  The calvarium is unremarkable.  Sinuses, middle ears and mastoids are clear.   IMPRESSION: No change since the previous study.  No evidence of shunt malfunction.  No evidence of acute stroke or other acute intracranial process.   Original Report Authenticated By: Paulina Fusi, M.D.       MDM  Iv ns. Labs. Ct.  Reviewed nursing notes and prior charts for  additional history.    k mildly low.  Ct neg acute.   Pt w unsteady gait, unable to ambulate. Lives alone. ?cva. Will admit for further workup. [facility] called.    Date: 02/05/2012  Rate: 73  Rhythm: normal sinus rhythm  QRS Axis: normal  Intervals: normal  ST/T Wave abnormalities: normal  Conduction Disutrbances:none  Narrative Interpretation:   Old EKG Reviewed: unchanged         Suzi Roots, MD 02/05/12 2104  Suzi Roots, MD 02/05/12 4098  Suzi Roots, MD 02/05/12 2111  Suzi Roots, MD 02/05/12 2112

## 2012-02-06 ENCOUNTER — Encounter (HOSPITAL_COMMUNITY): Payer: Self-pay | Admitting: *Deleted

## 2012-02-06 DIAGNOSIS — I6789 Other cerebrovascular disease: Secondary | ICD-10-CM

## 2012-02-06 DIAGNOSIS — E876 Hypokalemia: Secondary | ICD-10-CM | POA: Diagnosis present

## 2012-02-06 LAB — BASIC METABOLIC PANEL
Calcium: 8.3 mg/dL — ABNORMAL LOW (ref 8.4–10.5)
GFR calc Af Amer: >90 mL/min (ref >90)
GFR calc non Af Amer: 86 mL/min — ABNORMAL LOW (ref >90)
Potassium: 3.5 mEq/L (ref 3.5–5.1)
Sodium: 145 mEq/L (ref 135–145)

## 2012-02-06 LAB — GLUCOSE, CAPILLARY
Glucose-Capillary: 92 mg/dL (ref 70–99)
Glucose-Capillary: 77 mg/dL (ref 70–99)
Glucose-Capillary: 131 mg/dL — ABNORMAL HIGH (ref 70–99)
Glucose-Capillary: 114 mg/dL — ABNORMAL HIGH (ref 70–99)

## 2012-02-06 LAB — CBC
MCH: 27.8 pg (ref 26.0–34.0)
MCHC: 33.6 g/dL (ref 30.0–36.0)
Platelets: 189 10*3/uL (ref 150–400)
RBC: 4.31 MIL/uL (ref 4.22–5.81)

## 2012-02-06 LAB — LIPID PANEL
Cholesterol: 195 mg/dL (ref 0–200)
LDL Cholesterol: 97 mg/dL (ref 0–99)
Total CHOL/HDL Ratio: 2.4 RATIO
VLDL: 16 mg/dL (ref 0–40)

## 2012-02-06 LAB — CREATININE, SERUM: Creatinine, Ser: 0.99 mg/dL (ref 0.50–1.35)

## 2012-02-06 MED ORDER — PNEUMOCOCCAL VAC POLYVALENT 25 MCG/0.5ML IJ INJ
0.5000 mL | INJECTION | Freq: Once | INTRAMUSCULAR | Status: AC
  Administered 2012-02-06: 0.5 mL via INTRAMUSCULAR
  Filled 2012-02-06: qty 0.5

## 2012-02-06 MED ORDER — INFLUENZA VIRUS VACC SPLIT PF IM SUSP
0.5000 mL | Freq: Once | INTRAMUSCULAR | Status: AC
  Administered 2012-02-06: 0.5 mL via INTRAMUSCULAR
  Filled 2012-02-06: qty 0.5

## 2012-02-06 NOTE — Evaluation (Addendum)
Physical Therapy Evaluation Patient Details Name: Kurt Walton MRN: [id number] DOB: 14-May-1950 Today's Date: 02/06/2012 Time: 9528-4132 PT Time Calculation (min): 21 min  PT Assessment / Plan / Recommendation Clinical Impression  61 y.o. male admitted with  slurred speech, mild confusion and inability to walk due to loss of balance for 3 days. H/o aneurysm at age 49 with vp shunt, shunt malfunctioned and a new shunt was placed several months ago. At baseline pt walks with a RW for discoordination.  Upon PT/OT eval today pt required +2 total assist to ambulate 12' with RW. Significant ataxia LLE noted, pt unsteady and at high risk for falls. 24* assist recommended at SNF level or with family.     PT Assessment  Patient needs continued PT services    Follow Up Recommendations  Supervision/Assistance - 24 hour;SNF vs. CIR    Does the patient have the potential to tolerate intense rehabilitation      Barriers to Discharge        Equipment Recommendations  Wheelchair (measurements);3 in 1 bedside comode (if home w/c; HHOT to eval tub DME)    Recommendations for Other Services     Frequency Min 3X/week    Precautions / Restrictions Precautions Precautions: Fall Restrictions Weight Bearing Restrictions: No   Pertinent Vitals/Pain *no pain**      Mobility  Transfers Sit to Stand: 1: +2 Total assist;From bed;From toilet Sit to Stand: Patient Percentage: 80% Details for Transfer Assistance: assist for safety -pt unsteady in standing and at high risk for fall Ambulation/Gait Ambulation/Gait Assistance: 1: +2 Total assist Ambulation/Gait: Patient Percentage: 50% Ambulation Distance (Feet): 12 Feet Assistive device: Rolling walker Gait Pattern: Ataxic;Step-to pattern;Left circumduction General Gait Details: LLE ataxic, decreased safety awareness/awareness of deficits, multiple cues required to step into RW (LLE was ABDucted outside frame of RW)    Shoulder Instructions       Exercises     PT Diagnosis: Difficulty walking;Abnormality of gait;Altered mental status  PT Problem List: Decreased mobility;Decreased balance;Decreased safety awareness;Decreased knowledge of use of DME;Decreased cognition;Decreased coordination PT Treatment Interventions: DME instruction;Gait training;Functional mobility training;Therapeutic activities;Balance training;Patient/family education   PT Goals Acute Rehab PT Goals PT Goal Formulation: Patient unable to participate in goal setting Time For Goal Achievement: 02/06/12 Potential to Achieve Goals: Fair Pt will go Sit to Stand: with min assist PT Goal: Sit to Stand - Progress: Goal set today Pt will Transfer Bed to Chair/Chair to Bed: with min assist PT Transfer Goal: Bed to Chair/Chair to Bed - Progress: Goal set today Pt will Ambulate: 51 - 150 feet;with min assist;with rolling walker PT Goal: Ambulate - Progress: Goal set today  Visit Information  Last PT Received On: 02/06/12 Assistance Needed: +2 PT/OT Co-Evaluation/Treatment: Yes    Subjective Data  Subjective: I find a way to do what I want to do.  Patient Stated Goal: enjoy retirement, watch tv   Prior Functioning  Home Living Lives With: Alone Available Help at Discharge: Family Type of Home: Apartment Home Access: Stairs to enter Secretary/administrator of Steps: 1 Home Layout: One level Bathroom Shower/Tub: Tub/shower unit (grab bars) Bathroom Toilet: Standard (?) Home Adaptive Equipment: Environmental consultant - rolling;Grab bars in shower Additional Comments: unsure if all history is correct Prior Function Level of Independence: Independent with assistive device(s) (used RW) Comments: pt states family comes over every day or two Communication Communication: HOH Dominant Hand: Right    Cognition  Overall Cognitive Status: Impaired Area of Impairment: Memory;Safety/judgement;Awareness of deficits (unclear of baseline for  cognitive status) Arousal/Alertness:  Awake/alert Orientation Level: Time Behavior During Session: WFL for tasks performed Safety/Judgement: Decreased awareness of safety precautions;Decreased safety judgement for tasks assessed;Decreased awareness of need for assistance    Extremity/Trunk Assessment Right Upper Extremity Assessment RUE ROM/Strength/Tone: Within functional levels RUE Coordination: Deficits (ataxic but functional bilaterally) Left Upper Extremity Assessment LUE ROM/Strength/Tone: Deficits LUE ROM/Strength/Tone Deficits: arom shoulder approximately 130 LUE Coordination: Deficits (ataxic) Right Lower Extremity Assessment RLE ROM/Strength/Tone: Within functional levels RLE Sensation: WFL - Light Touch RLE Coordination: WFL - gross/fine motor Left Lower Extremity Assessment LLE ROM/Strength/Tone: Within functional levels LLE Sensation: WFL - Light Touch LLE Coordination: Deficits LLE Coordination Deficits: ataxic with ambulation Trunk Assessment Trunk Assessment: Normal   Balance Balance Balance Assessed: Yes Dynamic Sitting Balance Dynamic Sitting - Balance Support: No upper extremity supported Dynamic Sitting - Level of Assistance: 4: Min assist (pt leans posteriorly when moving legs)  End of Session PT - End of Session Equipment Utilized During Treatment: Gait belt Activity Tolerance: Patient tolerated treatment well Patient left: in chair;with chair alarm set;with call bell/phone within reach Nurse Communication: Mobility status  GP Functional Assessment Tool Used: clinical judgement Functional Limitation: Mobility: Walking and moving around Mobility: Walking and Moving Around Current Status ([id number]): At least 40 percent but less than 60 percent impaired, limited or restricted Mobility: Walking and Moving Around Goal Status [phone]): At least 1 percent but less than 20 percent impaired, limited or restricted   Tamala Ser 02/06/2012, 12:34 PM  [phone]

## 2012-02-06 NOTE — Progress Notes (Signed)
   CARE MANAGEMENT NOTE 02/06/2012  Patient:  Kurt Walton, Kurt Walton   Account Number:  [id number]  Date Initiated:  02/06/2012  Documentation initiated by:  Jiles Crocker  Subjective/Objective Assessment:   ADMITTED WITH ALTERED MENTAL STATUS     Action/Plan:   PATIENT LIVES ALONE AND IS ACTIVE WITH [facility]   Anticipated DC Date:  02/07/2012   Anticipated DC Plan:  HOME/SELF CARE      DC Planning Services  CM consult       Status of service:  In process, will continue to follow Medicare Important Message given?  NA - LOS <3 / Initial given by admissions (If response is "NO", the following Medicare IM given date fields will be blank)  Per UR Regulation:  Reviewed for med. necessity/level of care/duration of stay  Comments:  02/06/2012- B Delight Bickle RN,BSN,MHA

## 2012-02-06 NOTE — Progress Notes (Signed)
  Echocardiogram 2D Echocardiogram has been performed.  Jorje Guild 02/06/2012, 9:49 AM
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
## 2012-02-06 NOTE — Progress Notes (Signed)
VASCULAR LAB PRELIMINARY  PRELIMINARY  PRELIMINARY  PRELIMINARY  VASCULAR LAB PRELIMINARY  PRELIMINARY  PRELIMINARY  PRELIMINARY  Carotid duplex completed.    Preliminary report:  Bilateral:  No evidence of hemodynamically significant internal carotid artery stenosis.   Vertebral artery flow is antegrade.    Kurt Walton, RVS 02/06/2012, 11:27 AM

## 2012-02-06 NOTE — Evaluation (Signed)
Occupational Therapy Evaluation Patient Details Name: Kurt Walton MRN: [id number] DOB: 1950/11/04 Today's Date: 02/06/2012 Time: 4098-1191 OT Time Calculation (min): 21 min  OT Assessment / Plan / Recommendation Clinical Impression  This 61 year old man was admitted for ams.  He presented with slurred speech, mild confusion and loss of balance.  Pt has a h/o aneurysm and shunt from age 61 and had discoordination and used walker but lived alone per chart.  Pt now needs A x 2 for safety.  He is appropriate for skilled OT to increase independence with ADLs to reach a min to mod A level in acute.    OT Assessment  Patient needs continued OT Services    Follow Up Recommendations  CIR;SNF (if pt doesn't qualify for either, then 24/7)    Barriers to Discharge      Equipment Recommendations  Wheelchair (measurements);3 in 1 bedside comode (if home w/c; HHOT to eval tub DME)    Recommendations for Other Services    Frequency  Min 2X/week    Precautions / Restrictions Precautions Precautions: Fall Restrictions Weight Bearing Restrictions: No   Pertinent Vitals/Pain No pain    ADL  Grooming: Performed;Supervision/safety;Teeth care (cues to expectorate) Where Assessed - Grooming: Supported sitting Upper Body Bathing: Simulated;Set up Where Assessed - Upper Body Bathing: Supported sitting Lower Body Bathing: +2 Total assistance (for balance) Lower Body Bathing: Patient Percentage: 80% Where Assessed - Lower Body Bathing: Supported sit to stand Upper Body Dressing: Simulated;Supervision/safety Where Assessed - Upper Body Dressing: Supported sitting Lower Body Dressing: Performed;+2 Total assistance (for balance) Lower Body Dressing: Patient Percentage: 80% Where Assessed - Lower Body Dressing: Supported sit to Pharmacist, hospital: Performed;+2 Total assistance (ambulate) Toilet Transfer: Patient Percentage: 50% Statistician Method: Sit to Barista:  Comfort height toilet;Grab bars Toileting - Architect and Hygiene: Simulated;+2 Total assistance Toileting - Architect and Hygiene: Patient Percentage: 80% Where Assessed - Engineer, mining and Hygiene: Sit to stand from 3-in-1 or toilet Equipment Used: Gait belt;Rolling walker Transfers/Ambulation Related to ADLs: ambulated to bathroom; pt very unsteady; max cues to keep feet between walker legs and to keep both hands on walker ADL Comments: Pt has ataxic movement but able to brush teeth in sitting:  did swallow water once without expectorating.  Able to don socks.  +2 for safety due to balance.      OT Diagnosis: Generalized weakness  OT Problem List: Decreased strength;Decreased activity tolerance;Impaired balance (sitting and/or standing);Decreased cognition;Decreased safety awareness;Decreased knowledge of use of DME or AE OT Treatment Interventions: Self-care/ADL training;DME and/or AE instruction;Therapeutic activities;Patient/family education;Balance training;Cognitive remediation/compensation   OT Goals Acute Rehab OT Goals OT Goal Formulation: With patient Time For Goal Achievement: 02/20/12 Potential to Achieve Goals: Fair ADL Goals Pt Will Transfer to Toilet: with min assist;Stand pivot transfer;3-in-1 ADL Goal: Toilet Transfer - Progress: Goal set today Miscellaneous OT Goals Miscellaneous OT Goal #1: Pt will maintain static standing balance for 2 minutes for adls with min A OT Goal: Miscellaneous Goal #1 - Progress: Goal set today Miscellaneous OT Goal #2: Pt will maintain dynamic sitting balance during adls with min guard A for 5 minutes OT Goal: Miscellaneous Goal #2 - Progress: Goal set today  Visit Information  Last OT Received On: 02/06/12 Assistance Needed: +2    Subjective Data  Subjective: I just want to go home and watch tv Patient Stated Goal: same as above   Prior Functioning     Home Living Lives With:  Alone Available Help at Discharge: Family Type of Home: Apartment Home Access: Stairs to enter Secretary/administrator of Steps: 1 Home Layout: One level Bathroom Shower/Tub: Tub/shower unit (grab bars) Bathroom Toilet: Standard (?) Home Adaptive Equipment: Walker - rolling;Grab bars in shower Additional Comments: unsure if all history is correct Prior Function Level of Independence: Independent with assistive device(s) (used RW) Comments: pt states family comes over every day or two Communication Communication: HOH Dominant Hand: Right         Vision/Perception Praxis Praxis: Intact   Cognition  Overall Cognitive Status: Impaired Area of Impairment: Memory;Safety/judgement;Awareness of deficits (unclear of baseline for cognitive status) Arousal/Alertness: Awake/alert Orientation Level: Time Behavior During Session: WFL for tasks performed Safety/Judgement: Decreased awareness of safety precautions;Decreased safety judgement for tasks assessed;Decreased awareness of need for assistance    Extremity/Trunk Assessment Right Upper Extremity Assessment RUE ROM/Strength/Tone: Within functional levels RUE Coordination: Deficits (ataxic but functional bilaterally) Left Upper Extremity Assessment LUE ROM/Strength/Tone: Deficits LUE ROM/Strength/Tone Deficits: arom shoulder approximately 130 LUE Coordination: Deficits (ataxic) Right Lower Extremity Assessment RLE ROM/Strength/Tone: Within functional levels RLE Sensation: WFL - Light Touch RLE Coordination: WFL - gross/fine motor Left Lower Extremity Assessment LLE ROM/Strength/Tone: Within functional levels LLE Sensation: WFL - Light Touch LLE Coordination: Deficits LLE Coordination Deficits: ataxic with ambulation Trunk Assessment Trunk Assessment: Normal     Mobility Transfers Transfers: Sit to Stand Sit to Stand: 1: +2 Total assist;From bed;From toilet Sit to Stand: Patient Percentage: 80% Details for Transfer  Assistance: assist for safety -pt unsteady in standing and at high risk for fall     Shoulder Instructions     Exercise     Balance Balance Balance Assessed: Yes Dynamic Sitting Balance Dynamic Sitting - Balance Support: No upper extremity supported Dynamic Sitting - Level of Assistance: 4: Min assist (pt leans posteriorly when moving legs)   End of Session OT - End of Session Activity Tolerance: Patient tolerated treatment well Patient left: in chair;with call bell/phone within reach;with chair alarm set Nurse Communication: Mobility status  GO Functional Assessment Tool Used: clinical judgement/observation Functional Limitation: Self care Self Care Current Status (R6045): At least 40 percent but less than 60 percent impaired, limited or restricted Self Care Goal Status (W0981): At least 20 percent but less than 40 percent impaired, limited or restricted   Dilyn Smiles 02/06/2012, 12:46 PM Marica Otter, OTR/L [phone] 02/06/2012

## 2012-02-06 NOTE — Progress Notes (Signed)
Patient will have a bed available at [facility] on Monday. Patient's family agreeable with plan.   Clinical Social Work Department CLINICAL SOCIAL WORK PLACEMENT NOTE 02/06/2012  Patient:  BRYXTON, FORSHEY  Account Number:  [id number] Admit date:  02/05/2012  Clinical Social Worker:  Orpah Greek  Date/time:  02/06/2012 03:38 PM  Clinical Social Work is seeking post-discharge placement for this patient at the following level of care:   SKILLED NURSING   (*CSW will update this form in Epic as items are completed)   02/06/2012  Patient/family provided with [facility] Department of Clinical Social Work's list of facilities offering this level of care within the geographic area requested by the patient (or if unable, by the patient's family).  02/06/2012  Patient/family informed of their freedom to choose among providers that offer the needed level of care, that participate in Medicare, Medicaid or managed care program needed by the patient, have an available bed and are willing to accept the patient.  02/06/2012  Patient/family informed of [facility]' ownership interest in [facility], as well as of the fact that they are under no obligation to receive care at this facility.  PASARR submitted to EDS on 02/06/2012 PASARR number received from EDS on 02/06/2012  FL2 transmitted to all facilities in geographic area requested by pt/family on  02/06/2012 FL2 transmitted to all facilities within larger geographic area on   Patient informed that his/her managed care company has contracts with or will negotiate with  certain facilities, including the following:     Patient/family informed of bed offers received:  02/06/2012 Patient chooses bed at [facility] Physician recommends and patient chooses bed at    Patient to be transferred to  on   Patient to be transferred to facility by   The following physician request were entered in Epic:   Additional  Comments:  Unice Bailey, LCSW [facility] Clinical Social Worker cell #: [phone]

## 2012-02-06 NOTE — Progress Notes (Signed)
[facility] PROGRESS NOTE  Kurt Walton ZOX:[id number] DOB: 12/22/1950 DOA: 02/05/2012 PCP: No primary provider on file.  Assessment/Plan: 1. AMS - seems resolved this am. Pt oriented to self,place time. Cooperative.  no evidence of new CVA on CT scan. Cannot obtain MRI/MRA of brain per family because of "his shunt" it is worth noting that his shunt he had placed at age 61 is still in place so it may be that this would not be an MRI safe device?  Had shunt replaced  10/05/11 but old device left in place. Echo carotid doppler pending. Speech somewhat altered and pt reports slight numbness of lips. Of note, pt hearing impaired for 10 years. Swallow eval pending. NPO until then 2. Delerium -  Resolved. No signs of infection.  3. Hypokalemia: mild. Will replete and recheck 4. HTN. Controlled. Not on anti-hypertensive at home. monitor    Code Status: full Family Communication:  Disposition Plan: home when ready   Consultants:  none  Procedures:  none  Antibiotics:  none  HPI/Subjective: Awake alert oriented x3. Denies pain/discomfort. Speech slow and deliberate  Objective: Filed Vitals:   02/06/12 0100 02/06/12 0300 02/06/12 0500 02/06/12 0700  BP:  117/75 123/71 115/72  Pulse:  63 67 67  Temp: 98.4 F (36.9 C) 98.1 F (36.7 C) 98.6 F (37 C) 98.3 F (36.8 C)  TempSrc:  Oral Oral Oral  Resp:   18 19  Height:      Weight:      SpO2:  99% 100% 99%    Intake/Output Summary (Last 24 hours) at 02/06/12 0841 Last data filed at 02/06/12 0400  Gross per 24 hour  Intake     80 ml  Output      0 ml  Net     80 ml   Filed Weights   02/06/12 0053  Weight: 80.2 kg (176 lb 12.9 oz)    Exam:   General:  Awake alert NAD  Cardiovascular: RRR No MGR No LEE PPP  Respiratory: normal effort BSCTAB No wheeze/rhonchi  Abdomen: soft +BS non-tender to palpation  Neuro: cranial nerve II-XII intact. Facial symmetry. Speech slow and deliberate  Data Reviewed: Basic  Metabolic Panel:  Lab 02/06/12 8119 02/05/12 1825  NA -- 145  K -- 3.1*  CL -- 108  CO2 -- 24  GLUCOSE -- 125*  BUN -- 20  CREATININE 0.99 1.05  CALCIUM -- 9.2  MG -- --  PHOS -- --   Liver Function Tests:  Lab 02/05/12 1825  AST 51*  ALT 26  ALKPHOS 84  BILITOT 0.1*  PROT 8.0  ALBUMIN 3.7   No results found for this basename: LIPASE:5,AMYLASE:5 in the last 168 hours No results found for this basename: AMMONIA:5 in the last 168 hours CBC:  Lab 02/06/12 0636 02/05/12 1825  WBC 5.5 7.5  NEUTROABS -- --  HGB 12.0* 13.4  HCT 35.7* 39.0  MCV 82.8 83.3  PLT 189 200   Cardiac Enzymes: No results found for this basename: CKTOTAL:5,CKMB:5,CKMBINDEX:5,TROPONINI:5 in the last 168 hours BNP (last 3 results) No results found for this basename: PROBNP:3 in the last 8760 hours CBG:  Lab 02/06/12 0750 02/05/12 1823  GLUCAP 92 136*    No results found for this or any previous visit (from the past 240 hour(s)).   Studies: Ct Head Wo Contrast  02/05/2012  *RADIOLOGY REPORT*  Clinical Data: Altered mental status for 3 days.  Slurred speech. Fusion.  CT HEAD WITHOUT CONTRAST  Technique:  Contiguous axial images were obtained from the base of the skull through the vertex without contrast.  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  Shunt tube within the posterior subarachnoid space is unchanged with chronic cystic change within the bone.  Second shunt tube enters the occipital horn of the right lateral ventricle.  Third shunt tube enters the occipital horn of the right lateral ventricle and crosses the midline into the left lateral ventricle.  All these appear unchanged.  Ventricular size is stable.  No evidence of shunt malfunction.  No evidence of acute infarction, hemorrhage or extra- axial collection.  The calvarium is unremarkable.  Sinuses, middle ears and mastoids are clear.  IMPRESSION: No  change since the previous study.  No evidence of shunt malfunction.  No evidence of acute stroke or other acute intracranial process.   Original Report Authenticated By: Paulina Fusi, M.D.     Scheduled Meds:   . aspirin  81 mg Oral Daily  . heparin  5,000 Units Subcutaneous Q8H  . influenza  inactive virus vaccine  0.5 mL Intramuscular Once  . multivitamin with minerals  1 tablet Oral Daily  . pneumococcal 23 valent vaccine  0.5 mL Intramuscular Once  . [COMPLETED] potassium chloride  40 mEq Oral Once   Continuous Infusions:   . sodium chloride 20 mL/hr at 02/05/12 1913    Principal Problem:  *Altered mental status Active Problems:  Delirium    Time spent: 30 minutes    Gwenyth Bender NP [facility]  If 8PM-8AM, please contact night-coverage at [email or web address], password [facility] 02/06/2012, 8:41 AM  LOS: 1 day

## 2012-02-06 NOTE — Progress Notes (Signed)
Clinical Social Work Department BRIEF PSYCHOSOCIAL ASSESSMENT 02/06/2012  Patient:  Kurt Walton, Kurt Walton     Account Number:  [id number]     Admit date:  02/05/2012  Clinical Social Worker:  Orpah Greek  Date/Time:  02/06/2012 03:32 PM  Referred by:  Physician  Date Referred:  02/06/2012 Referred for  SNF Placement   Other Referral:   Interview type:  Family Other interview type:    PSYCHOSOCIAL DATA Living Status:  ALONE Admitted from facility:   Level of care:   Primary support name:  Kurt Walton (ex-wife) h#: [phone] Primary support relationship to patient:  FAMILY Degree of support available:   good    CURRENT CONCERNS Current Concerns  Post-Acute Placement   Other Concerns:    SOCIAL WORK ASSESSMENT / PLAN CSW spoke with patient's ex-wife, Kurt Walton and daughter, Kurt Walton (cell#: [phone]) re: discharge planning. Patient had been living alone with family checking on him multiple times a day though family states he had become unsafe at home, not taking medications, increased confusion. Family is agreeable with SNF plan.   Assessment/plan status:  Information/Referral to [facility] Other assessment/ plan:   Information/referral to community resources:   CSW completed FL2 and faxed information out to [facility] - confirmed with Kurt Walton @ [facility] that they will have a bed for patient on Monday.    PATIENT'S/FAMILY'S RESPONSE TO PLAN OF CARE: Patient had been admitted to [facility] 10/29/11 for about a month, then discharged home. Patient will be accepted into [facility] in January, but will need placement until then.        Kurt Bailey, LCSW [facility] Clinical Social Worker cell #: [phone]

## 2012-02-06 NOTE — ED Notes (Signed)
Pt able to drink water and eat [place] without difficulty.

## 2012-02-06 NOTE — Progress Notes (Signed)
Patient seen and examined by me.  See note by Toya Smothers Spoke with ex wife: Says she saw him on Friday and took him grocery shopping and then checked on him Tuesday and he was not himself- confused and slurred speech   Takes seroquel  at night.  Gives himself medications- tramadol for pain, 1 tid prn- ketoprofen 50 mg   PT eval- at baseline walks with walker and is slow-  Family has patient registered with [facility]- 03/25/12.  Marlin Canary DO

## 2012-02-07 DIAGNOSIS — I10 Essential (primary) hypertension: Secondary | ICD-10-CM

## 2012-02-07 LAB — BASIC METABOLIC PANEL
BUN: 15 mg/dL (ref 6–23)
Calcium: 8.8 mg/dL (ref 8.4–10.5)
Creatinine, Ser: 0.9 mg/dL (ref 0.50–1.35)
GFR calc Af Amer: >90 mL/min (ref >90)

## 2012-02-07 LAB — GLUCOSE, CAPILLARY: Glucose-Capillary: 80 mg/dL (ref 70–99)

## 2012-02-07 NOTE — Progress Notes (Addendum)
Occupational Therapy Treatment Patient Details Name: Kurt Walton MRN: [id number] DOB: 24-Apr-1950 Today's Date: 02/07/2012 Time: 4696-2952 OT Time Calculation (min): 23 min  OT Assessment / Plan / Recommendation Comments on Treatment Session Pt is making steady improvements.  He verbalizes that he needs help yet he needs cues during functional activities as he attempted to pick up paper towel that fell and attempted to reach way out of  arms reach for urinal.  When therapist was in front of pt, cues were more effective due to hearing impairment    Follow Up Recommendations       Barriers to Discharge       Equipment Recommendations       Recommendations for Other Services    Frequency Min 2X/week   Plan      Precautions / Restrictions Precautions Precautions: Fall Restrictions Weight Bearing Restrictions: No   Pertinent Vitals/Pain No pain    ADL  Grooming: Performed;Wash/dry hands;Minimal assistance Where Assessed - Grooming: Supported Copywriter, advertising: Performed;Moderate assistance Toilet Transfer Method: Sit to Barista:  (stood in front of commode:  mod cues for safety) Transfers/Ambulation Related to ADLs: Worked on sit to stand.  cued pt to look at each hand before placing it on armrests for increased motor control:  works reaching down but not up or out in space.  Also worked on several sit to stands. Pt has posterior lean:  mod A.  Worked on dynamic sitting balance reaching for objects with feet supported and RUE supported.  pt did ambulate to bathroom with mod A.  Spoke to NT:  still recommend +2 as pt is unsteady, but he did a much better job keeping walker at an appropriate distance and keeping both feet within the walker parameters.      OT Diagnosis:    OT Problem List:   OT Treatment Interventions:     OT Goals Acute Rehab OT Goals Time For Goal Achievement: 02/20/12 ADL Goals Pt Will Transfer to Toilet: with min assist;Stand  pivot transfer;3-in-1 ADL Goal: Toilet Transfer - Progress: Progressing toward goals Miscellaneous OT Goals Miscellaneous OT Goal #1: Pt will maintain static standing balance for 2 minutes for adls with min A OT Goal: Miscellaneous Goal #1 - Progress: Met Miscellaneous OT Goal #2: Pt will maintain dynamic sitting balance during adls with min guard A for 5 minutes OT Goal: Miscellaneous Goal #2 - Progress: Progressing toward goals  Visit Information  Last OT Received On: 02/07/12 Assistance Needed: +2    Subjective Data      Prior Functioning       Cognition  Overall Cognitive Status: Impaired Arousal/Alertness: Awake/alert Orientation Level: Time Behavior During Session: [facility] for tasks performed Safety/Judgement: Decreased safety judgement for tasks assessed;Decreased awareness of safety precautions Cognition - Other Comments: pt verbalizes he didn't know how bad he was.  Hearing impaired also makes it difficult when therapist is behind him.    Mobility  Shoulder Instructions Transfers Sit to Stand: 4: Min assist;3: Mod assist;From chair/3-in-1 (3 trials:  2 moderate due to posterior lean) Details for Transfer Assistance: min cues for safety, hand placement       Exercises      Balance Dynamic Sitting Balance Dynamic Sitting - Balance Support: Right upper extremity supported;Feet supported Dynamic Sitting - Level of Assistance: 5: Stand by assistance (moderate excursions) Static Standing Balance Static Standing - Balance Support: Bilateral upper extremity supported Static Standing - Level of Assistance: 4: Min assist Static Standing - Comment/# of  Minutes: 2 minutes   End of Session OT - End of Session Activity Tolerance: Patient tolerated treatment well Patient left: in chair;with call bell/phone within reach;with chair alarm set Nurse Communication: Mobility status  GO     Aalliyah Kilker 02/07/2012, 3:54 PMMaryellen Karleen Hampshire, OTR/L [phone] 02/07/2012

## 2012-02-07 NOTE — Progress Notes (Signed)
[facility] PROGRESS NOTE  Armon Orvis ZOX:[id number] DOB: [birth year] DOA: 02/05/2012 PCP: No primary provider on file.  Assessment/Plan: 1. AMS - Appears to be at baseline this am.  Pt oriented to self,place. Cooperative. no evidence of new CVA on CT scan. Cannot obtain MRI/MRA of brain per family because of "his shunt" it is worth noting that his shunt he had placed at age 61 is still in place so it may be that this would not be an MRI safe device? Had shunt replaced 10/05/11 but old device left in place. Echo yields wall thickness was normal. Systolic function was normal. The estimated ejection fraction was in the range of 60% to 65%. Speech somewhat altered and pt reports slight numbness of lips. Of note, pt hearing impaired for 10 years. Suspect pt memory issues resulted in him confusing his medications leading to AMS.  2. Delerium - Resolved. No signs of infection.   3. Hypokalemia: resolved  4.HTN. Controlled. Not on anti-hypertensive at home. monitor  Code Status: full Family Communication:  Disposition Plan: Has a bed at facility on Monday, then registered with [facility] 1/14.    Consultants:  none  Procedures:  none  Antibiotics:  none  HPI/Subjective: Sitting on side of bed eating breakfast. Smiling. NAD  Objective: Filed Vitals:   02/06/12 1036 02/06/12 1300 02/06/12 2117 02/07/12 0545  BP: 113/84 121/75 124/78 122/72  Pulse: 65 63 65 65  Temp:  98.5 F (36.9 C) 98.7 F (37.1 C) 98.9 F (37.2 C)  TempSrc:  Oral Oral   Resp: 18 18 18 18   Height:      Weight:      SpO2: 100% 100% 100% 100%    Intake/Output Summary (Last 24 hours) at 02/07/12 0818 Last data filed at 02/06/12 1700  Gross per 24 hour  Intake    320 ml  Output    200 ml  Net    120 ml   Filed Weights   02/06/12 0053  Weight: 80.2 kg (176 lb 12.9 oz)    Exam:   General:  Awake alert NAD  Cardiovascular: RRR No MGR No LEE PPP  Respiratory: normal effort. BSCTAB No  wheeze  Abdomen: round soft +BS non-tender to palpation  Neuro: cranial nerve II-XII intact  Data Reviewed: Basic Metabolic Panel:  Lab 02/07/12 8119 02/06/12 0636 02/05/12 1825  NA 139 145 145  K 3.7 3.5 3.1*  CL 105 111 108  CO2 27 23 24   GLUCOSE 104* 92 125*  BUN 15 16 20   CREATININE 0.90 0.990.99 1.05  CALCIUM 8.8 8.3* 9.2  MG -- -- --  PHOS -- -- --   Liver Function Tests:  Lab 02/05/12 1825  AST 51*  ALT 26  ALKPHOS 84  BILITOT 0.1*  PROT 8.0  ALBUMIN 3.7   No results found for this basename: LIPASE:5,AMYLASE:5 in the last 168 hours No results found for this basename: AMMONIA:5 in the last 168 hours CBC:  Lab 02/06/12 0636 02/05/12 1825  WBC 5.5 7.5  NEUTROABS -- --  HGB 12.0* 13.4  HCT 35.7* 39.0  MCV 82.8 83.3  PLT 189 200   Cardiac Enzymes: No results found for this basename: CKTOTAL:5,CKMB:5,CKMBINDEX:5,TROPONINI:5 in the last 168 hours BNP (last 3 results) No results found for this basename: PROBNP:3 in the last 8760 hours CBG:  Lab 02/07/12 0744 02/06/12 2113 02/06/12 1643 02/06/12 1144 02/06/12 0750  GLUCAP 105* 114* 131* 77 92    No results found for this or any previous visit (  from the past 240 hour(s)).   Studies: Ct Head Wo Contrast  02/05/2012  *RADIOLOGY REPORT*  Clinical Data: Altered mental status for 3 days.  Slurred speech. Fusion.  CT HEAD WITHOUT CONTRAST  Technique:  Contiguous axial images were obtained from the base of the skull through the vertex without contrast.  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  Shunt tube within the posterior subarachnoid space is unchanged with chronic cystic change within the bone.  Second shunt tube enters the occipital horn of the right lateral ventricle.  Third shunt tube enters the occipital horn of the right lateral ventricle and crosses the midline into the left lateral ventricle.  All these appear  unchanged.  Ventricular size is stable.  No evidence of shunt malfunction.  No evidence of acute infarction, hemorrhage or extra- axial collection.  The calvarium is unremarkable.  Sinuses, middle ears and mastoids are clear.  IMPRESSION: No change since the previous study.  No evidence of shunt malfunction.  No evidence of acute stroke or other acute intracranial process.   Original Report Authenticated By: Paulina Fusi, M.D.     Scheduled Meds:   . aspirin  81 mg Oral Daily  . heparin  5,000 Units Subcutaneous Q8H  . [COMPLETED] influenza  inactive virus vaccine  0.5 mL Intramuscular Once  . multivitamin with minerals  1 tablet Oral Daily  . [COMPLETED] pneumococcal 23 valent vaccine  0.5 mL Intramuscular Once   Continuous Infusions:   . sodium chloride 20 mL/hr at 02/05/12 1913    Principal Problem:  *Altered mental status Active Problems:  Delirium  HTN (hypertension)  Hypokalemia    Time spent: 30 minutes    Gwenyth Bender NP [facility]  If 8PM-8AM, please contact night-coverage at [email or web address], password [facility] 02/07/2012, 8:18 AM  LOS: 2 days

## 2012-02-07 NOTE — Progress Notes (Signed)
Patient seen and examined by me.  Please see note by Toya Smothers.  Plan to place in SNF for now until picked up by [facility], not safe to be at home.  Seems to be back to baseline.  Marlin Canary DO

## 2012-02-08 DIAGNOSIS — G911 Obstructive hydrocephalus: Secondary | ICD-10-CM

## 2012-02-08 LAB — GLUCOSE, CAPILLARY: Glucose-Capillary: 77 mg/dL (ref 70–99)

## 2012-02-08 NOTE — Progress Notes (Signed)
[facility] PROGRESS NOTE  Kurt Walton FAO:[id number] DOB: 1950/10/22 DOA: 02/05/2012 PCP: No primary provider on file.  Assessment/Plan: 1. AMS - Appears to be at baseline this am.  Pt oriented to self,place. Cooperative. no evidence of new CVA on CT scan. Cannot obtain MRI/MRA of brain per family because of "his shunt" it is worth noting that his shunt he had placed at age 61 is still in place so it may be that this would not be an MRI safe device? Had shunt replaced 10/05/11 but old device left in place. Echo yields wall thickness was normal. Systolic function was normal. The estimated ejection fraction was in the range of 60% to 65%. Speech somewhat altered and pt reports slight numbness of lips. Of note, pt hearing impaired for 10 years. Suspect pt memory issues resulted in him confusing his medications leading to AMS.  2. Delerium - Resolved. No signs of infection.   3. Hypokalemia: resolved  4.HTN. Controlled. Not on anti-hypertensive at home. monitor  Code Status: full Family Communication: ex-wife Disposition Plan: Has a bed at facility on Monday, then registered with [facility] 1/14.    Consultants:  none  Procedures:  none  Antibiotics:  none  HPI/Subjective: NAD, wanting to go home, no SOB, no CP  Objective: Filed Vitals:   02/07/12 0545 02/07/12 1446 02/07/12 2135 02/08/12 0603  BP: 122/72 131/88 129/85 122/62  Pulse: 65 98 65 57  Temp: 98.9 F (37.2 C) 97.7 F (36.5 C) 98.4 F (36.9 C) 98.4 F (36.9 C)  TempSrc:  Oral Oral Oral  Resp: 18 18 20 18   Height:      Weight:      SpO2: 100% 100% 100% 100%    Intake/Output Summary (Last 24 hours) at 02/08/12 0914 Last data filed at 02/08/12 0615  Gross per 24 hour  Intake    480 ml  Output   3175 ml  Net  -2695 ml   Filed Weights   02/06/12 0053  Weight: 80.2 kg (176 lb 12.9 oz)    Exam:   General:  Awake alert NAD  Cardiovascular: RRR No MGR No LEE PPP  Respiratory: normal effort. BSCTAB No  wheeze  Abdomen: round soft +BS non-tender to palpation  Neuro: cranial nerve II-XII intact  Data Reviewed: Basic Metabolic Panel:  Lab 02/07/12 6962 02/06/12 0636 02/05/12 1825  NA 139 145 145  K 3.7 3.5 3.1*  CL 105 111 108  CO2 27 23 24   GLUCOSE 104* 92 125*  BUN 15 16 20   CREATININE 0.90 0.990.99 1.05  CALCIUM 8.8 8.3* 9.2  MG -- -- --  PHOS -- -- --   Liver Function Tests:  Lab 02/05/12 1825  AST 51*  ALT 26  ALKPHOS 84  BILITOT 0.1*  PROT 8.0  ALBUMIN 3.7   No results found for this basename: LIPASE:5,AMYLASE:5 in the last 168 hours No results found for this basename: AMMONIA:5 in the last 168 hours CBC:  Lab 02/06/12 0636 02/05/12 1825  WBC 5.5 7.5  NEUTROABS -- --  HGB 12.0* 13.4  HCT 35.7* 39.0  MCV 82.8 83.3  PLT 189 200   Cardiac Enzymes: No results found for this basename: CKTOTAL:5,CKMB:5,CKMBINDEX:5,TROPONINI:5 in the last 168 hours BNP (last 3 results) No results found for this basename: PROBNP:3 in the last 8760 hours CBG:  Lab 02/08/12 0748 02/07/12 2133 02/07/12 1659 02/07/12 1131 02/07/12 0744  GLUCAP 77 73 91 80 105*    No results found for this or any previous visit (  from the past 240 hour(s)).   Studies: No results found.  Scheduled Meds:    . aspirin  81 mg Oral Daily  . heparin  5,000 Units Subcutaneous Q8H  . multivitamin with minerals  1 tablet Oral Daily   Continuous Infusions:    . sodium chloride 20 mL/hr at 02/05/12 1913    Principal Problem:  *Altered mental status Active Problems:  Delirium  HTN (hypertension)  Hypokalemia    Time spent: 30 minutes    VANN, JESSICA  [phone] [facility]  If 8PM-8AM, please contact night-coverage at [email or web address], password [facility] 02/08/2012, 9:14 AM  LOS: 3 days

## 2012-02-09 DIAGNOSIS — R404 Transient alteration of awareness: Secondary | ICD-10-CM

## 2012-02-09 LAB — GLUCOSE, CAPILLARY
Glucose-Capillary: 81 mg/dL (ref 70–99)
Glucose-Capillary: 80 mg/dL (ref 70–99)

## 2012-02-09 NOTE — Progress Notes (Signed)
[facility] PROGRESS NOTE  Kurt Walton UYQ:[id number] DOB: [birth year] DOA: 02/05/2012 PCP: No primary provider on file.  Assessment/Plan: 1. AMS - Appears to be at baseline this am.  Pt oriented to self,place. Cooperative. no evidence of new CVA on CT scan. Cannot obtain MRI/MRA of brain per family because of "his shunt" it is worth noting that his shunt he had placed at age 61 is still in place so it may be that this would not be an MRI safe device? Had shunt replaced 10/05/11 but old device left in place. Echo yields wall thickness was normal. Systolic function was normal. The estimated ejection fraction was in the range of 60% to 65%. Speech somewhat altered and pt reports slight numbness of lips. Of note, pt hearing impaired for 10 years. Suspect pt memory issues resulted in him confusing his medications leading to AMS.  2. Delerium - Resolved. No signs of infection.   3. Hypokalemia: resolved  4.HTN. Controlled. Not on anti-hypertensive at home. monitor  Code Status: full Family Communication: ex-wife Disposition Plan: Has a bed at facility on Monday, then registered with [facility] 1/14.    Consultants:  none  Procedures:  none  Antibiotics:  none  HPI/Subjective: C/o some back pain this am- asked nursing to get patient up No SOB, no CP  Objective: Filed Vitals:   02/08/12 0603 02/08/12 1311 02/08/12 2051 02/09/12 0543  BP: 122/62 120/73 138/78 130/69  Pulse: 57 82 71 66  Temp: 98.4 F (36.9 C) 98.6 F (37 C) 97.9 F (36.6 C) 98.4 F (36.9 C)  TempSrc: Oral Oral Oral Oral  Resp: 18 18 18 18   Height:      Weight:      SpO2: 100% 100% 97% 100%    Intake/Output Summary (Last 24 hours) at 02/09/12 0906 Last data filed at 02/09/12 0517  Gross per 24 hour  Intake    720 ml  Output   2150 ml  Net  -1430 ml   Filed Weights   02/06/12 0053  Weight: 80.2 kg (176 lb 12.9 oz)    Exam:   General:  Awake alert NAD  Cardiovascular: RRR No MGR No LEE  PPP  Respiratory: normal effort. BSCTAB No wheeze  Abdomen: round soft +BS non-tender to palpation  Neuro: cranial nerve II-XII intact  Data Reviewed: Basic Metabolic Panel:  Lab 02/07/12 6387 02/06/12 0636 02/05/12 1825  NA 139 145 145  K 3.7 3.5 3.1*  CL 105 111 108  CO2 27 23 24   GLUCOSE 104* 92 125*  BUN 15 16 20   CREATININE 0.90 0.990.99 1.05  CALCIUM 8.8 8.3* 9.2  MG -- -- --  PHOS -- -- --   Liver Function Tests:  Lab 02/05/12 1825  AST 51*  ALT 26  ALKPHOS 84  BILITOT 0.1*  PROT 8.0  ALBUMIN 3.7   No results found for this basename: LIPASE:5,AMYLASE:5 in the last 168 hours No results found for this basename: AMMONIA:5 in the last 168 hours CBC:  Lab 02/06/12 0636 02/05/12 1825  WBC 5.5 7.5  NEUTROABS -- --  HGB 12.0* 13.4  HCT 35.7* 39.0  MCV 82.8 83.3  PLT 189 200   Cardiac Enzymes: No results found for this basename: CKTOTAL:5,CKMB:5,CKMBINDEX:5,TROPONINI:5 in the last 168 hours BNP (last 3 results) No results found for this basename: PROBNP:3 in the last 8760 hours CBG:  Lab 02/09/12 0800 02/08/12 2048 02/08/12 1630 02/08/12 1201 02/08/12 0748  GLUCAP 81 97 137* 79 77    No results  found for this or any previous visit (from the past 240 hour(s)).   Studies: No results found.  Scheduled Meds:    . aspirin  81 mg Oral Daily  . heparin  5,000 Units Subcutaneous Q8H  . multivitamin with minerals  1 tablet Oral Daily   Continuous Infusions:    . sodium chloride 20 mL/hr at 02/05/12 1913    Principal Problem:  *Altered mental status Active Problems:  Delirium  HTN (hypertension)  Hypokalemia    Time spent: 30 minutes    Kurt Walton  [phone] [facility]  If 8PM-8AM, please contact night-coverage at [email or web address], password [facility] 02/09/2012, 9:06 AM  LOS: 4 days

## 2012-02-10 LAB — GLUCOSE, CAPILLARY: Glucose-Capillary: 89 mg/dL (ref 70–99)

## 2012-02-10 NOTE — Discharge Summary (Signed)
Patient seen and examined by me.  Plan to d/c to SNF today.  Needs lots of care at home that family was unable to provide.  Patient is to follow with [facility] on 1/14.  Marlin Canary DO

## 2012-02-10 NOTE — Progress Notes (Signed)
Speech Language Pathology Treatment Patient Details Name: Kurt Walton MRN: [id number] DOB: 06-27-1950 Today's Date: 02/10/2012 Time: 4098-1191 SLP Time Calculation (min): 20 min  Assessment / Plan / Recommendation Clinical Impression  Pt seen for cogling treatment with mild improvements apparent today.   Pt was oriented to self, place, date but not situation.  He was able to verbalize basic problem solving (call 911) but suspect generalization is impaired.  RN reports pt is not using call bell for assist, but they are frequently checking on him.  Pt did verbalize that he needed to use call bell to get assist to use bathrooom and he completed this with min verbal cue.  Pt did not recall that he is to dc to rehab today stating he is going home.  Speech is much clearer today than during initial evaluation.  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  Unfortunately family still was not present during eval to aid establishing baseline.  hours prior to slp visit.  Pt is not using "pocket talker" SLP left for him, as he states he can lipread well.  SLP recommends follow up at SNF to maximize functional cogling skills and decr caregiver burden.     SLP Plan  Continue with current plan of care    Pertinent Vitals/Pain Afebrile, decreased  SLP Goals  SLP Goals Potential to Achieve Goals: Fair Potential Considerations: Ability to learn/carryover information;Previous level of function;Family/community support Progress/Goals/Alternative treatment plan discussed with pt/caregiver and they: Agree SLP Goal #1 - Progress: Progressing toward goal  General Temperature Spikes Noted: No Respiratory Status: Room air Behavior/Cognition: Alert;Cooperative;Pleasant mood;Hard of hearing Oral Cavity - Dentition: Adequate natural dentition Patient Positioning: Upright in bed  Oral Cavity - Oral Hygiene     Treatment Treatment focused on: Cognition Skilled Treatment: education, verbal cueing    GO     Donavan Burnet, MS [facility] SLP [phone]

## 2012-02-10 NOTE — Progress Notes (Signed)
Patient is set to discharge to [facility] today. Patient's ex-wife, [name] & daughter, Joni Reining at bedside and agreeable with plan. [facility] called for transport.   Clinical Social Work Department CLINICAL SOCIAL WORK PLACEMENT NOTE 02/10/2012  Patient:  Kurt Walton, Kurt Walton  Account Number:  [id number] Admit date:  02/05/2012  Clinical Social Worker:  Orpah Greek  Date/time:  02/06/2012 03:38 PM  Clinical Social Work is seeking post-discharge placement for this patient at the following level of care:   SKILLED NURSING   (*CSW will update this form in Epic as items are completed)   02/06/2012  Patient/family provided with [facility] Department of Clinical Social Work's list of facilities offering this level of care within the geographic area requested by the patient (or if unable, by the patient's family).  02/06/2012  Patient/family informed of their freedom to choose among providers that offer the needed level of care, that participate in Medicare, Medicaid or managed care program needed by the patient, have an available bed and are willing to accept the patient.  02/06/2012  Patient/family informed of [facility]' ownership interest in [facility], as well as of the fact that they are under no obligation to receive care at this facility.  PASARR submitted to EDS on 02/06/2012 PASARR number received from EDS on 02/06/2012  FL2 transmitted to all facilities in geographic area requested by pt/family on  02/06/2012 FL2 transmitted to all facilities within larger geographic area on   Patient informed that his/her managed care company has contracts with or will negotiate with  certain facilities, including the following:     Patient/family informed of bed offers received:  02/06/2012 Patient chooses bed at [facility] Physician recommends and patient chooses bed at    Patient to be transferred to [facility] on  02/10/2012 Patient to be  transferred to facility by [facility]  The following physician request were entered in Epic:   Additional Comments:  Unice Bailey, LCSW [facility] Clinical Social Worker cell #: [phone]

## 2012-02-10 NOTE — Discharge Summary (Signed)
Physician Discharge Summary  Kurt Walton ZOX:[id number] DOB: 1950-08-19 DOA: 02/05/2012  PCP: No primary provider on file.  Admit date: 02/05/2012 Discharge date: 02/10/2012  Time spent: 40 minutes  Recommendations for Outpatient Follow-up:  1. Pt medically stable and ready for discharge to SNF. Will be [facility] pt 1/14.  Discharge Diagnoses:  Principal Problem:  *Altered mental status Active Problems:  Delirium  HTN (hypertension)  Hypokalemia   Discharge Condition: medically stable and ready for discharge to snf  Diet recommendation: regular  Filed Weights   02/06/12 0053  Weight: 80.2 kg (176 lb 12.9 oz)    History of present illness:  Kurt Walton is a 61 y.o. male who presented to [facility] ED on 02/05/12 with new onset slurred speech, mild confusion and inability to walk due to loss of ballance for the previous 3 days. At baseline he does have significant discordination and walks with a walker. Symptoms onset about 48 hours prior per family, was last seen normal prior to that, patient lives alone. Nothing makes symptoms better nor worse. 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 Pt referred to medicine service for admission.      Hospital Course:  1. AMS -  Admitted to tele. No evidence of new CVA on CT scan. Cannot obtain MRI/MRA of brain per family because of "his shunt". Had shunt replaced 10/05/11 but old device left in place and functioning. Echo yields wall thickness was normal. Systolic function was normal. The estimated ejection fraction was in the range of 60% to 65%. Speech somewhat altered initially and returned to baseline by discharge. Of note, pt hearing impaired for 10 years and baseline speech not completely clear.  Suspect pt memory issues resulted in him confusing his medications leading to AMS. EKG yielded NSR. Carotid doppler yields no significant extracranial carotid artery stenosis demonstrated.  Vertebrals are patent with antegrade flow. 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 Pt seen by ST and at discharge Pt seen for cogling treatment with mild improvements apparent today.Speech is much clearer today than during initial evaluation. Recommending SNF to maximize functional cogling skills.  2. Delerium - Resolved. No signs of infection.   3. Hypokalemia: likely related to decreased po intake at admission.  resolved at discharge.   4.HTN. Controlled. SBP range 120-138 at discharge Not on anti-hypertensive at home.       Procedures:  none  Consultations:  none  Discharge Exam: Filed Vitals:   02/09/12 0543 02/09/12 1430 02/09/12 2115 02/10/12 0602  BP: 130/69 131/85 122/73 120/72  Pulse: 66 72 65 65  Temp: 98.4 F (36.9 C) 98.2 F (36.8 C) 98.6 F (37 C) 98.4 F (36.9 C)  TempSrc: Oral Oral Oral Oral  Resp: 18 20 20 18   Height:      Weight:      SpO2: 100% 100% 100% 100%    General: awake alert NAD Cardiovascular: RRR No MGR Respiratory: normal effort. BSCTAB No wheeze/rhonchi  Discharge Instructions  Discharge Orders    Future Orders Please Complete By Expires   Diet - low sodium heart healthy      Increase activity slowly          Medication List     As of 02/10/2012 11:44 AM    TAKE these medications         aspirin 81 MG chewable tablet   Chew 81 mg by mouth daily.  multivitamin with minerals Tabs   Take 1 tablet by mouth daily.      traMADol 50 MG tablet   Commonly known as: ULTRAM   Take 50 mg by mouth every 6 (six) hours as needed. For pain.           Follow-up Information    Please follow up.   Contact information:   will be discharged to SNF. will Registered with [facility] starting 1/14.          The results of significant diagnostics from this hospitalization (including imaging, microbiology, ancillary and laboratory) are listed below for reference.    Significant Diagnostic Studies: Ct Head Wo  Contrast  02/05/2012  *RADIOLOGY REPORT*  Clinical Data: Altered mental status for 3 days.  Slurred speech. Fusion.  CT HEAD WITHOUT CONTRAST  Technique:  Contiguous axial images were obtained from the base of the skull through the vertex without contrast.  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  Shunt tube within the posterior subarachnoid space is unchanged with chronic cystic change within the bone.  Second shunt tube enters the occipital horn of the right lateral ventricle.  Third shunt tube enters the occipital horn of the right lateral ventricle and crosses the midline into the left lateral ventricle.  All these appear unchanged.  Ventricular size is stable.  No evidence of shunt malfunction.  No evidence of acute infarction, hemorrhage or extra- axial collection.  The calvarium is unremarkable.  Sinuses, middle ears and mastoids are clear.  IMPRESSION: No change since the previous study.  No evidence of shunt malfunction.  No evidence of acute stroke or other acute intracranial process.   Original Report Authenticated By: Paulina Fusi, M.D.     Microbiology: No results found for this or any previous visit (from the past 240 hour(s)).   Labs: Basic Metabolic Panel:  Lab 02/07/12 1610 02/06/12 0636 02/05/12 1825  NA 139 145 145  K 3.7 3.5 3.1*  CL 105 111 108  CO2 27 23 24   GLUCOSE 104* 92 125*  BUN 15 16 20   CREATININE 0.90 0.990.99 1.05  CALCIUM 8.8 8.3* 9.2  MG -- -- --  PHOS -- -- --   Liver Function Tests:  Lab 02/05/12 1825  AST 51*  ALT 26  ALKPHOS 84  BILITOT 0.1*  PROT 8.0  ALBUMIN 3.7   No results found for this basename: LIPASE:5,AMYLASE:5 in the last 168 hours No results found for this basename: AMMONIA:5 in the last 168 hours CBC:  Lab 02/06/12 0636 02/05/12 1825  WBC 5.5 7.5  NEUTROABS -- --  HGB 12.0* 13.4  HCT 35.7* 39.0  MCV 82.8 83.3  PLT 189 200   Cardiac  Enzymes: No results found for this basename: CKTOTAL:5,CKMB:5,CKMBINDEX:5,TROPONINI:5 in the last 168 hours BNP: BNP (last 3 results) No results found for this basename: PROBNP:3 in the last 8760 hours CBG:  Lab 02/10/12 0733 02/09/12 2116 02/09/12 1701 02/09/12 1156 02/09/12 0800  GLUCAP 89 93 85 80 81       Signed:  Amelia Macken M NP [facility] 02/10/2012, 11:44 AM

## 2012-02-10 NOTE — Progress Notes (Signed)
Discharge to [facility], Called for report but was put on hold. Patient picked up by transport personal, no complaints of any pain or discomfort. PIV removed no s/s of swelling or infiltration noted.

## 2012-02-10 NOTE — Progress Notes (Signed)
CBG is 62, patient is asymptomatic at this time, given orange juice, CBG recheck 86.

## 2012-02-11 LAB — GLUCOSE, CAPILLARY: Glucose-Capillary: 86 mg/dL (ref 70–99)

## 2012-07-20 ENCOUNTER — Ambulatory Visit
Admission: RE | Admit: 2012-07-20 | Discharge: 2012-07-20 | Disposition: A | Discharge status: Home / Self Care | Payer: No Typology Code available for payment source | Source: Ambulatory Visit | Attending: Family Medicine | Admitting: Family Medicine

## 2012-07-20 ENCOUNTER — Other Ambulatory Visit: Payer: Self-pay | Admitting: Family Medicine

## 2012-07-20 DIAGNOSIS — R7611 Nonspecific reaction to tuberculin skin test without active tuberculosis: Secondary | ICD-10-CM

## 2013-11-04 ENCOUNTER — Other Ambulatory Visit: Payer: Self-pay | Admitting: Family Medicine

## 2013-11-04 DIAGNOSIS — H9190 Unspecified hearing loss, unspecified ear: Secondary | ICD-10-CM

## 2013-11-09 ENCOUNTER — Other Ambulatory Visit: Payer: Medicaid Other

## 2013-11-12 ENCOUNTER — Emergency Department (INDEPENDENT_AMBULATORY_CARE_PROVIDER_SITE_OTHER)
Admission: EM | Admit: 2013-11-12 | Discharge: 2013-11-12 | Disposition: A | Discharge status: Home / Self Care | Payer: Medicare (Managed Care) | Source: Home / Self Care | Attending: Family Medicine | Admitting: Family Medicine

## 2013-11-12 ENCOUNTER — Encounter (HOSPITAL_COMMUNITY): Payer: Self-pay | Admitting: Emergency Medicine

## 2013-11-12 DIAGNOSIS — M79645 Pain in left finger(s): Secondary | ICD-10-CM

## 2013-11-12 DIAGNOSIS — S61219A Laceration without foreign body of unspecified finger without damage to nail, initial encounter: Secondary | ICD-10-CM

## 2013-11-12 DIAGNOSIS — S61209A Unspecified open wound of unspecified finger without damage to nail, initial encounter: Secondary | ICD-10-CM

## 2013-11-12 DIAGNOSIS — W268XXA Contact with other sharp object(s), not elsewhere classified, initial encounter: Secondary | ICD-10-CM

## 2013-11-12 DIAGNOSIS — M79609 Pain in unspecified limb: Secondary | ICD-10-CM

## 2013-11-12 MED ORDER — HYDROCODONE-ACETAMINOPHEN 5-325 MG PO TABS
ORAL_TABLET | ORAL | Status: AC
  Filled 2013-11-12: qty 1

## 2013-11-12 MED ORDER — HYDROCODONE-ACETAMINOPHEN 5-325 MG PO TABS: 1.0000 | ORAL_TABLET | Freq: Four times a day (QID) | ORAL | Status: DC | PRN

## 2013-11-12 NOTE — Discharge Instructions (Signed)
° °  Keep dressing clean and dry for the next 24 hours. After this may remove, but keep dry and covered. May apply neosporin and cover with band aid. Remove stitches in 5- days. #5 in place.

## 2013-11-12 NOTE — ED Notes (Addendum)
Patient c/o left hand middle finger laceration onset today. Patient reports he was clipping his toenails with a pair of scissors and hit his finger. Daughter reports scissors were rusted. EMS was called and they wrapped the finger and told him to come to urgent care. Patient is unsure of when his last tetanus shot was. Patient is alert and oriented and in no acute distress.

## 2013-11-12 NOTE — ED Provider Notes (Signed)
CSN: [id number]     Arrival date & time 11/12/13  1843 History   First MD Initiated Contact with Patient 11/12/13 1918     Chief Complaint  Patient presents with  . Laceration   (Consider location/radiation/quality/duration/timing/severity/associated sxs/prior Treatment) HPI Comments: Patient presents today with his daughter. He was trimming his toenails today with scissors and cut the end of his left 3rd finger tip. He had prolonged bleeding and EMS was called. They recommended presentation here for stiches. No anti-coag therapy.   Patient is a 63 y.o. male presenting with skin laceration. The history is provided by the patient and a relative.  Laceration   History reviewed. No pertinent past medical history. Past Surgical History  Procedure Laterality Date  . Ventriculoperitoneal shunt  10/17/2011    Procedure: SHUNT INSERTION VENTRICULAR-PERITONEAL;  Surgeon: Ophelia Charter, MD;  Location: [facility];  Service: Neurosurgery;  Laterality: Right;  Instertion of Ventricular-Peritoneal Shunt  . Tumor removal      brain   No family history on file. History  Substance Use Topics  . Smoking status: Current Every Day Smoker -- 1.00 packs/day  . Smokeless tobacco: Not on file  . Alcohol Use: Yes    Review of Systems  All other systems reviewed and are negative.   Allergies  Review of patient's allergies indicates no known allergies.  Home Medications   Prior to Admission medications   Medication Sig Start Date End Date Taking? Authorizing Provider  aspirin 81 MG chewable tablet Chew 81 mg by mouth daily.    Historical Provider, MD  HYDROcodone-acetaminophen (NORCO/VICODIN) 5-325 MG per tablet Take 1 tablet by mouth every 6 (six) hours as needed for moderate pain. 11/12/13   Bjorn Pippin, PA-C  Multiple Vitamin (MULTIVITAMIN WITH MINERALS) TABS Take 1 tablet by mouth daily.    Historical Provider, MD  traMADol (ULTRAM) 50 MG tablet Take 50 mg by mouth every 6 (six) hours  as needed. For pain.    Historical Provider, MD   BP 127/88  Pulse 90  Temp(Src) 98.5 F (36.9 C) (Oral)  Resp 16  SpO2 99% Physical Exam  Nursing note and vitals reviewed. Constitutional: He is oriented to person, place, and time. He appears well-developed and well-nourished. He appears distressed.  HENT:  Head: Normocephalic and atraumatic.  Pulmonary/Chest: Effort normal.  Musculoskeletal:  Full ROM in the left 3rd Finger, nail not involved. Sensation and motor intact  Neurological: He is alert and oriented to person, place, and time. No cranial nerve deficit.  Skin: Skin is warm and dry. He is not diaphoretic.  L-Shaped Laceration to left 3rd finger, volar surface. Clean cut. Superficial. Sensation in tact. ROM intact.   Psychiatric: His behavior is normal.    ED Course  LACERATION REPAIR Date/Time: 11/12/2013 8:26 PM Performed by: Bjorn Pippin Authorized by: Lynne Leader, S Consent: Verbal consent obtained. written consent not obtained. Risks and benefits: risks, benefits and alternatives were discussed Consent given by: patient Patient understanding: patient states understanding of the procedure being performed Patient consent: the patient's understanding of the procedure matches consent given Patient identity confirmed: verbally with patient and arm band Body area: upper extremity Location details: left hand Laceration length: 2 cm Foreign bodies: no foreign bodies Local anesthetic: lidocaine 1% without epinephrine Anesthetic total: 1 ml Patient sedated: no Preparation: Patient was prepped and draped in the usual sterile fashion. Irrigation solution: saline Irrigation method: syringe Skin closure: 6-0 nylon Number of sutures: 5 Technique: simple Approximation: loose Approximation  difficulty: simple Dressing: non-adhesive packing strip and tube gauze Patient tolerance: Patient tolerated the procedure well with no immediate complications.   (including  critical care time) Labs Review Labs Reviewed - No data to display  Imaging Review No results found.   MDM   1. Finger laceration, initial encounter   2. Finger pain, left    Procedure for laceration repair without complications. 5 sutures placed, simple closure. Hydroocodone given for moderate pain, Tylenol for mild pain.      Bjorn Pippin, PA-C 11/12/13 2149

## 2013-11-13 NOTE — ED Provider Notes (Signed)
Medical screening examination/treatment/procedure(s) were performed by a resident physician or non-physician practitioner and as the supervising physician I was immediately available for consultation/collaboration.  Lynne Leader, MD    Gregor Hams, MD 11/13/13 [phone]

## 2013-12-17 ENCOUNTER — Ambulatory Visit
Admission: RE | Admit: 2013-12-17 | Discharge: 2013-12-17 | Disposition: A | Discharge status: Home / Self Care | Payer: No Typology Code available for payment source | Source: Ambulatory Visit | Attending: Family Medicine | Admitting: Family Medicine

## 2013-12-17 DIAGNOSIS — H9190 Unspecified hearing loss, unspecified ear: Secondary | ICD-10-CM

## 2015-12-19 ENCOUNTER — Encounter: Payer: Self-pay | Admitting: Internal Medicine

## 2016-01-11 ENCOUNTER — Ambulatory Visit (AMBULATORY_SURGERY_CENTER): Payer: Self-pay | Admitting: *Deleted

## 2016-01-11 VITALS — Ht 69.0 in | Wt 218.0 lb

## 2016-01-11 DIAGNOSIS — Z1211 Encounter for screening for malignant neoplasm of colon: Secondary | ICD-10-CM

## 2016-01-11 MED ORDER — NA SULFATE-K SULFATE-MG SULF 17.5-3.13-1.6 GM/177ML PO SOLN: 1.0000 | Freq: Once | ORAL | 0 refills | Status: AC

## 2016-01-11 NOTE — Progress Notes (Signed)
No egg or soy allergy. No anesthesia problems.  No home O2.  No diet meds.  

## 2016-01-24 ENCOUNTER — Ambulatory Visit (AMBULATORY_SURGERY_CENTER): Payer: Medicare (Managed Care) | Admitting: Internal Medicine

## 2016-01-24 ENCOUNTER — Encounter: Payer: Self-pay | Admitting: Internal Medicine

## 2016-01-24 VITALS — BP 137/77 | HR 80 | Temp 98.7°F | Resp 18 | Ht 69.0 in | Wt 218.0 lb

## 2016-01-24 DIAGNOSIS — Z1211 Encounter for screening for malignant neoplasm of colon: Secondary | ICD-10-CM

## 2016-01-24 DIAGNOSIS — D122 Benign neoplasm of ascending colon: Secondary | ICD-10-CM | POA: Diagnosis not present

## 2016-01-24 DIAGNOSIS — Z1212 Encounter for screening for malignant neoplasm of rectum: Secondary | ICD-10-CM | POA: Diagnosis not present

## 2016-01-24 MED ORDER — SODIUM CHLORIDE 0.9 % IV SOLN: 500.0000 mL | INTRAVENOUS | Status: DC

## 2016-01-24 NOTE — Progress Notes (Signed)
Called to room to assist during endoscopic procedure.  Patient ID and intended procedure confirmed with present staff. Received instructions for my participation in the procedure from the performing physician.  

## 2016-01-24 NOTE — Op Note (Signed)
[place] Patient Name: Azi Totin Procedure Date: 01/24/2016 2:42 PM MRN: [id number] Endoscopist: Gatha Mayer , MD Age: 65 Referring MD:  Date of Birth: 01/22/1951 Gender: Male Account #: [id number] Procedure:                Colonoscopy Indications:              Screening for colorectal malignant neoplasm, This                            is the patient's first colonoscopy Medicines:                Propofol per Anesthesia, Monitored Anesthesia Care Procedure:                Pre-Anesthesia Assessment:                           - Prior to the procedure, a History and Physical                            was performed, and patient medications and                            allergies were reviewed. The patient's tolerance of                            previous anesthesia was also reviewed. The risks                            and benefits of the procedure and the sedation                            options and risks were discussed with the patient.                            All questions were answered, and informed consent                            was obtained. Prior Anticoagulants: The patient                            last took aspirin 5 days prior to the procedure.                            ASA Grade Assessment: II - A patient with mild                            systemic disease. After reviewing the risks and                            benefits, the patient was deemed in satisfactory                            condition to undergo the procedure.  After obtaining informed consent, the colonoscope                            was passed under direct vision. Throughout the                            procedure, the patient's blood pressure, pulse, and                            oxygen saturations were monitored continuously. The                            Model CF-HQ190L [id number]) scope was introduced                            through the  anus and advanced to the the cecum,                            identified by appendiceal orifice and ileocecal                            valve. The colonoscopy was somewhat difficult due                            to significant looping. Successful completion of                            the procedure was aided by using manual pressure.                            The patient tolerated the procedure well. The                            quality of the bowel preparation was good. The                            ileocecal valve, appendiceal orifice, and rectum                            were photographed. Scope In: 2:55:05 PM Scope Out: 3:17:16 PM Scope Withdrawal Time: 0 hours 16 minutes 2 seconds  Total Procedure Duration: 0 hours 22 minutes 11 seconds  Findings:                 The perianal and digital rectal examinations were                            normal. Pertinent negatives include normal prostate                            (size, shape, and consistency).                           Two sessile polyps were found in the ascending  colon. The polyps were 2 to 4 mm in size. These                            polyps were removed with a cold biopsy forceps.                            Resection and retrieval were complete. Verification                            of patient identification for the specimen was                            done. Estimated blood loss was minimal.                           The colon (entire examined portion) was moderately                            redundant. Advancing the scope required using                            manual pressure.                           The exam was otherwise without abnormality on                            direct and retroflexion views. Complications:            No immediate complications. Estimated Blood Loss:     Estimated blood loss was minimal. Impression:               - Two 2 to 4 mm polyps in the  ascending colon,                            removed with a cold biopsy forceps. Resected and                            retrieved.                           - Redundant colon.                           - The examination was otherwise normal on direct                            and retroflexion views. Recommendation:           - Patient has a contact number available for                            emergencies. The signs and symptoms of potential                            delayed complications were discussed with  the                            patient. Return to normal activities tomorrow.                            Written discharge instructions were provided to the                            patient.                           - Resume previous diet.                           - Continue present medications.                           - Repeat colonoscopy is recommended for                            surveillance. The colonoscopy date will be                            determined after pathology results from today's                            exam become available for review. Gatha Mayer, MD 01/24/2016 3:22:42 PM This report has been signed electronically.

## 2016-01-24 NOTE — Progress Notes (Signed)
To PACU Pt awake and alert. Report to RN 

## 2016-01-24 NOTE — Patient Instructions (Addendum)
   I found and removed 2 small polyps.  I will let you know pathology results and when to have another routine colonoscopy by mail.  I appreciate the opportunity to care for you. Gatha Mayer, MD, FACG  YOU HAD AN ENDOSCOPIC PROCEDURE TODAY AT [facility]:   Refer to the procedure report that was given to you for any specific questions about what was found during the examination.  If the procedure report does not answer your questions, please call your gastroenterologist to clarify.  If you requested that your care partner not be given the details of your procedure findings, then the procedure report has been included in a sealed envelope for you to review at your convenience later.  YOU SHOULD EXPECT: Some feelings of bloating in the abdomen. Passage of more gas than usual.  Walking can help get rid of the air that was put into your GI tract during the procedure and reduce the bloating. If you had a lower endoscopy (such as a colonoscopy or flexible sigmoidoscopy) you may notice spotting of blood in your stool or on the toilet paper. If you underwent a bowel prep for your procedure, you may not have a normal bowel movement for a few days.  Please Note:  You might notice some irritation and congestion in your nose or some drainage.  This is from the oxygen used during your procedure.  There is no need for concern and it should clear up in a day or so.  SYMPTOMS TO REPORT IMMEDIATELY:   Following lower endoscopy (colonoscopy or flexible sigmoidoscopy):  Excessive amounts of blood in the stool  Significant tenderness or worsening of abdominal pains  Swelling of the abdomen that is new, acute  Fever of 100F or higher  For urgent or emergent issues, a gastroenterologist can be reached at any hour by calling [phone].  DIET:  We do recommend a small meal at first, but then you may proceed to your regular diet.  Drink plenty of fluids but you should avoid alcoholic  beverages for 24 hours.  ACTIVITY:  You should plan to take it easy for the rest of today and you should NOT DRIVE or use heavy machinery until tomorrow (because of the sedation medicines used during the test).    FOLLOW UP: Our staff will call the number listed on your records the next business day following your procedure to check on you and address any questions or concerns that you may have regarding the information given to you following your procedure. If we do not reach you, we will leave a message.  However, if you are feeling well and you are not experiencing any problems, there is no need to return our call.  We will assume that you have returned to your regular daily activities without incident.  If any biopsies were taken you will be contacted by phone or by letter within the next 1-3 weeks.  Please call us at [phone] if you have not heard about the biopsies in 3 weeks.   SIGNATURES/CONFIDENTIALITY: You and/or your care partner have signed paperwork which will be entered into your electronic medical record.  These signatures attest to the fact that that the information above on your After Visit Summary has been reviewed and is understood.  Full responsibility of the confidentiality of this discharge information lies with you and/or your care-partner.  Please read over handout about polyps  Continue your normal medications

## 2016-01-25 ENCOUNTER — Telehealth: Payer: Self-pay

## 2016-01-25 NOTE — Telephone Encounter (Signed)
Left a message on [phone] for the pt.  I advised them to call us if needed and we will try to call back this pm. maw

## 2016-01-29 ENCOUNTER — Encounter: Payer: Self-pay | Admitting: Internal Medicine

## 2016-01-29 DIAGNOSIS — Z860101 Personal history of adenomatous and serrated colon polyps: Secondary | ICD-10-CM

## 2016-01-29 DIAGNOSIS — Z86010 Personal history of colonic polyps: Secondary | ICD-10-CM

## 2016-01-29 HISTORY — DX: Personal history of adenomatous and serrated colon polyps: Z86.0101

## 2016-01-29 HISTORY — DX: Personal history of colonic polyps: Z86.010

## 2016-12-31 ENCOUNTER — Other Ambulatory Visit: Payer: Self-pay | Admitting: Family Medicine

## 2016-12-31 DIAGNOSIS — Z982 Presence of cerebrospinal fluid drainage device: Secondary | ICD-10-CM

## 2017-01-06 ENCOUNTER — Other Ambulatory Visit: Payer: Medicare (Managed Care)

## 2017-01-13 ENCOUNTER — Ambulatory Visit
Admission: RE | Admit: 2017-01-13 | Discharge: 2017-01-13 | Disposition: A | Discharge status: Home / Self Care | Payer: Medicare (Managed Care) | Source: Ambulatory Visit | Attending: Family Medicine | Admitting: Family Medicine

## 2017-01-13 DIAGNOSIS — Z982 Presence of cerebrospinal fluid drainage device: Secondary | ICD-10-CM

## 2020-01-10 ENCOUNTER — Telehealth: Payer: Medicare (Managed Care)

## 2020-01-10 NOTE — Telephone Encounter (Signed)
Mendel Ryder with [facility] called in to schedule a TTE for a patient, said orders were faxed on Friday around 9 AM. Have not received fax, advised to send again.

## 2020-01-12 ENCOUNTER — Other Ambulatory Visit (HOSPITAL_COMMUNITY): Payer: Self-pay | Admitting: Internal Medicine

## 2020-01-12 DIAGNOSIS — R0602 Shortness of breath: Secondary | ICD-10-CM

## 2020-01-12 DIAGNOSIS — R2243 Localized swelling, mass and lump, lower limb, bilateral: Secondary | ICD-10-CM

## 2020-01-20 ENCOUNTER — Telehealth: Payer: Self-pay

## 2020-01-20 NOTE — Telephone Encounter (Signed)
Notes on file from [place],  [facility] [place] referral to scheduling.

## 2020-01-25 ENCOUNTER — Encounter (HOSPITAL_COMMUNITY): Payer: Medicare (Managed Care)

## 2020-01-26 ENCOUNTER — Ambulatory Visit (HOSPITAL_COMMUNITY)
Admission: RE | Admit: 2020-01-26 | Discharge: 2020-01-26 | Disposition: A | Discharge status: Home / Self Care | Payer: Medicare (Managed Care) | Source: Ambulatory Visit | Attending: Internal Medicine | Admitting: Internal Medicine

## 2020-01-26 ENCOUNTER — Other Ambulatory Visit: Payer: Self-pay

## 2020-01-26 ENCOUNTER — Other Ambulatory Visit (HOSPITAL_COMMUNITY): Payer: Self-pay | Admitting: Internal Medicine

## 2020-01-26 DIAGNOSIS — R609 Edema, unspecified: Secondary | ICD-10-CM

## 2020-02-07 ENCOUNTER — Other Ambulatory Visit (HOSPITAL_COMMUNITY): Payer: Medicare (Managed Care)

## 2020-02-29 ENCOUNTER — Other Ambulatory Visit (HOSPITAL_COMMUNITY): Payer: Medicare (Managed Care)

## 2020-03-09 ENCOUNTER — Other Ambulatory Visit (HOSPITAL_COMMUNITY): Payer: Medicare (Managed Care)

## 2020-03-09 ENCOUNTER — Encounter (HOSPITAL_COMMUNITY): Payer: Self-pay | Admitting: Cardiology

## 2020-03-09 NOTE — Progress Notes (Unsigned)
Patient ID: Kurt Walton, male   DOB: Sep 26, 1950, 69 y.o.   MRN: [id number]   Verified appointment "no show" status with Renetta at 8:23am.

## 2020-03-31 ENCOUNTER — Other Ambulatory Visit (HOSPITAL_COMMUNITY): Payer: Medicare (Managed Care)

## 2020-04-20 ENCOUNTER — Other Ambulatory Visit (HOSPITAL_COMMUNITY): Payer: Medicare (Managed Care)

## 2020-05-05 ENCOUNTER — Other Ambulatory Visit: Payer: Self-pay

## 2020-05-05 ENCOUNTER — Ambulatory Visit (HOSPITAL_COMMUNITY): Payer: Medicare (Managed Care) | Attending: Cardiology

## 2020-05-05 DIAGNOSIS — R0602 Shortness of breath: Secondary | ICD-10-CM | POA: Insufficient documentation

## 2020-05-05 DIAGNOSIS — R2243 Localized swelling, mass and lump, lower limb, bilateral: Secondary | ICD-10-CM | POA: Insufficient documentation

## 2020-05-05 LAB — ECHOCARDIOGRAM COMPLETE
Area-P 1/2: 3.6 cm2
S' Lateral: 2.8 cm

## 2020-11-10 ENCOUNTER — Encounter: Payer: Self-pay | Admitting: Internal Medicine

## 2020-12-19 ENCOUNTER — Emergency Department (HOSPITAL_COMMUNITY): Payer: Medicare (Managed Care)

## 2020-12-19 ENCOUNTER — Encounter (HOSPITAL_COMMUNITY): Payer: Self-pay

## 2020-12-19 ENCOUNTER — Emergency Department (HOSPITAL_BASED_OUTPATIENT_CLINIC_OR_DEPARTMENT_OTHER)
Admit: 2020-12-19 | Discharge: 2020-12-19 | Disposition: A | Discharge status: Home / Self Care | Payer: Medicare (Managed Care) | Attending: Emergency Medicine | Admitting: Emergency Medicine

## 2020-12-19 ENCOUNTER — Emergency Department (HOSPITAL_COMMUNITY)
Admission: EM | Admit: 2020-12-19 | Discharge: 2020-12-19 | Disposition: A | Discharge status: Home / Self Care | Payer: Medicare (Managed Care) | Attending: Emergency Medicine | Admitting: Emergency Medicine

## 2020-12-19 DIAGNOSIS — S82842A Displaced bimalleolar fracture of left lower leg, initial encounter for closed fracture: Secondary | ICD-10-CM | POA: Insufficient documentation

## 2020-12-19 DIAGNOSIS — Z7982 Long term (current) use of aspirin: Secondary | ICD-10-CM | POA: Insufficient documentation

## 2020-12-19 DIAGNOSIS — R609 Edema, unspecified: Secondary | ICD-10-CM | POA: Diagnosis not present

## 2020-12-19 DIAGNOSIS — S82839A Other fracture of upper and lower end of unspecified fibula, initial encounter for closed fracture: Secondary | ICD-10-CM

## 2020-12-19 DIAGNOSIS — S99912A Unspecified injury of left ankle, initial encounter: Secondary | ICD-10-CM | POA: Diagnosis present

## 2020-12-19 DIAGNOSIS — R600 Localized edema: Secondary | ICD-10-CM | POA: Diagnosis not present

## 2020-12-19 DIAGNOSIS — W1830XA Fall on same level, unspecified, initial encounter: Secondary | ICD-10-CM | POA: Diagnosis not present

## 2020-12-19 DIAGNOSIS — R52 Pain, unspecified: Secondary | ICD-10-CM

## 2020-12-19 DIAGNOSIS — I10 Essential (primary) hypertension: Secondary | ICD-10-CM | POA: Insufficient documentation

## 2020-12-19 DIAGNOSIS — Z79899 Other long term (current) drug therapy: Secondary | ICD-10-CM | POA: Insufficient documentation

## 2020-12-19 DIAGNOSIS — Z87891 Personal history of nicotine dependence: Secondary | ICD-10-CM | POA: Diagnosis not present

## 2020-12-19 LAB — CBC WITH DIFFERENTIAL/PLATELET
Abs Immature Granulocytes: 0.03 10*3/uL (ref 0.00–0.07)
Basophils Absolute: 0 10*3/uL (ref 0.0–0.1)
Basophils Relative: 0 %
Eosinophils Absolute: 0.1 10*3/uL (ref 0.0–0.5)
Eosinophils Relative: 1 %
HCT: 36.7 % — ABNORMAL LOW (ref 39.0–52.0)
Hemoglobin: 11.3 g/dL — ABNORMAL LOW (ref 13.0–17.0)
Immature Granulocytes: 1 %
Lymphocytes Relative: 23 %
Lymphs Abs: 1.4 10*3/uL (ref 0.7–4.0)
MCH: 28.4 pg (ref 26.0–34.0)
MCHC: 30.8 g/dL (ref 30.0–36.0)
MCV: 92.2 fL (ref 80.0–100.0)
Monocytes Absolute: 0.7 10*3/uL (ref 0.1–1.0)
Monocytes Relative: 12 %
Neutro Abs: 3.8 10*3/uL (ref 1.7–7.7)
Neutrophils Relative %: 63 %
Platelets: 197 10*3/uL (ref 150–400)
RBC: 3.98 MIL/uL — ABNORMAL LOW (ref 4.22–5.81)
RDW: 14.6 % (ref 11.5–15.5)
WBC: 6 10*3/uL (ref 4.0–10.5)
nRBC: 0 % (ref 0.0–0.2)

## 2020-12-19 LAB — COMPREHENSIVE METABOLIC PANEL
ALT: 23 U/L (ref 0–44)
AST: 30 U/L (ref 15–41)
Albumin: 4.1 g/dL (ref 3.5–5.0)
Alkaline Phosphatase: 47 U/L (ref 38–126)
Anion gap: 10 (ref 5–15)
BUN: 25 mg/dL — ABNORMAL HIGH (ref 8–23)
CO2: 28 mmol/L (ref 22–32)
Calcium: 9.3 mg/dL (ref 8.9–10.3)
Chloride: 105 mmol/L (ref 98–111)
Creatinine, Ser: 1.45 mg/dL — ABNORMAL HIGH (ref 0.61–1.24)
GFR, Estimated: 52 mL/min — ABNORMAL LOW (ref >60)
Glucose, Bld: 118 mg/dL — ABNORMAL HIGH (ref 70–99)
Potassium: 4.6 mmol/L (ref 3.5–5.1)
Sodium: 143 mmol/L (ref 135–145)
Total Bilirubin: 0.6 mg/dL (ref 0.3–1.2)
Total Protein: 7.8 g/dL (ref 6.5–8.1)

## 2020-12-19 LAB — BRAIN NATRIURETIC PEPTIDE: B Natriuretic Peptide: 31 pg/mL (ref 0.0–100.0)

## 2020-12-19 MED ORDER — HYDROCODONE-ACETAMINOPHEN 5-325 MG PO TABS
1.0000 | ORAL_TABLET | Freq: Once | ORAL | Status: AC
  Administered 2020-12-19: 1 via ORAL
  Filled 2020-12-19: qty 1

## 2020-12-19 MED ORDER — OXYCODONE-ACETAMINOPHEN 5-325 MG PO TABS
1.0000 | ORAL_TABLET | Freq: Once | ORAL | Status: AC
  Administered 2020-12-19: 1 via ORAL
  Filled 2020-12-19: qty 1

## 2020-12-19 MED ORDER — OXYCODONE-ACETAMINOPHEN 5-325 MG PO TABS: 1.0000 | ORAL_TABLET | Freq: Four times a day (QID) | ORAL | 0 refills | Status: DC | PRN

## 2020-12-19 NOTE — ED Triage Notes (Signed)
Pt brought in by daughter.   Pt states his feet and legs hurt and he can't stand. Symptoms started Sunday.   Bilateral leg/feet swelling. Left is worse.    Pt in wheelchair  Daughter reports patients legs have been swollen X1 year but now worse and painful.   Pt reports using compression socks in the past.

## 2020-12-19 NOTE — ED Notes (Signed)
Called ortho for splint

## 2020-12-19 NOTE — ED Provider Notes (Signed)
Emergency Medicine Provider Triage Evaluation Note  Cayne Yom , a 70 y.o. male  was evaluated in triage.  Pt complains of ble swelling that has been present for the last year but is worsening and now causing pain to the point that the patient is having difficulty walking.  Review of Systems  Positive: Ble swelling Negative: sob  Physical Exam  BP 121/68 (BP Location: Right Arm)   Pulse 76   Temp 98.1 F (36.7 C) (Oral)   Resp 18   Ht 5\' 9"  (1.753 m)   Wt 86.2 kg   SpO2 99%   BMI 28.06 kg/m  Gen:   Awake, no distress   Resp:  Normal effort  MSK:   Moves extremities without difficulty  Other:  BLE edema, 3+, LLE with erythema, warmth. 5th toenail is partially avulsed  Medical Decision Making  Medically screening exam initiated at 1:16 PM.  Appropriate orders placed.  Tayvon Culley was informed that the remainder of the evaluation will be completed by another provider, this initial triage assessment does not replace that evaluation, and the importance of remaining in the ED until their evaluation is complete.     Bishop Dublin 12/19/20 1318    Teressa Lower, MD 12/19/20 1755

## 2020-12-19 NOTE — ED Provider Notes (Signed)
[facility] DEPT Provider Note   CSN: [id number] Arrival date & time: 12/19/20  1232     History Chief Complaint  Patient presents with   Leg Swelling    Bilateral     Kurt Walton is a 70 y.o. male.  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  Daughter reports for the last 1 year he has had swelling in bilateral lower extremities but since Sunday 3 days prior to arrival noticed significantly more swelling in the left foot and pain.  The pain is gradually getting worse to the point where it is difficult for the patient to walk.  He did have a fall last night because he was getting up trying to get some socks and did not call for his daughter to help him.  She reports he did not get injured during the fall.  He complains of pain all the time in his left foot but it is much worse with walking.  It does not radiate.  He has not had new shortness of breath, cough, chest pain, fever.  Although the right foot and leg are swollen they are not tender and daughter feels that they are at baseline.  He was wearing compression socks for a while but he is waiting to get a new pair.  He has been seeing his doctor about this but they felt it was more related to venous insufficiency and inactivity.  Patient does not take any anticoagulation and has no prior history of clots.  He is still taking his blood pressure medications.  Daughter gave him an ibuprofen on Sunday which did improve his pain for approximately 24 hours but now it is back.  He has been taking some tramadol at home as well.  The history is provided by the patient and a caregiver.      Past Medical History:  Diagnosis Date   Abnormal gait    Cerebral aneurysm    age 68 with VP shunt   Hard of hearing    Hx of  adenomatous colonic polyps 01/29/2016   Hypertension    Substance abuse ([place])    ETOH, past   Urinary incontinence     Patient Active Problem List   Diagnosis Date Noted   Hx of adenomatous colonic polyps 01/29/2016   Hydrocephalus ([place]) 10/18/2011   HTN (hypertension) 10/18/2011    Past Surgical History:  Procedure Laterality Date   HIP SURGERY     TUMOR REMOVAL     not tumor, but aneurysm   VENTRICULOPERITONEAL SHUNT  10/17/2011   Procedure: SHUNT INSERTION VENTRICULAR-PERITONEAL;  Surgeon: Ophelia Charter, MD;  Location: [facility];  Service: Neurosurgery;  Laterality: Right;  Instertion of Ventricular-Peritoneal Shunt   VENTRICULOPERITONEAL SHUNT     once at age 73 and replaced 2013.       Family History  Problem Relation Age of Onset   Colon cancer Neg Hx     Social History   Tobacco Use   Smoking status: Former    Packs/day: 1.00    Types: Cigarettes    Quit date: 11/11/2011    Years since quitting: 9.1   Smokeless tobacco: Never  Substance Use Topics   Alcohol use: No    Comment: hx of ETOH abuse  Drug use: No    Home Medications Prior to Admission medications   Medication Sig Start Date End Date Taking? Authorizing Provider  acetaminophen (TYLENOL) 325 MG tablet Take 650 mg by mouth every 6 (six) hours as needed.   Yes [provider]  aspirin 81 MG chewable tablet Chew 81 mg by mouth daily.   Yes [provider]  Aspirin-Salicylamide-Caffeine (BC HEADACHE POWDER PO) Take 1 packet by mouth as needed (foot pain).   Yes [provider]  chlorthalidone (HYGROTON) 25 MG tablet Take 25 mg by mouth daily.   Yes [provider]  cholecalciferol (VITAMIN D) 1000 units tablet Take 1,000 Units by mouth daily.   Yes [provider]  docusate sodium (COLACE) 50 MG capsule Take 50 mg by mouth 2 (two) times daily.   Yes [provider]  gabapentin (NEURONTIN) 600 MG tablet Take 600 mg by mouth at bedtime.   Yes  [provider]  lisinopril (ZESTRIL) 20 MG tablet Take 20 mg by mouth daily.   Yes [provider]  Multiple Vitamin (MULTIVITAMIN WITH MINERALS) TABS Take 1 tablet by mouth daily.   Yes [provider]  pravastatin (PRAVACHOL) 40 MG tablet Take 40 mg by mouth daily.   Yes [provider]  traZODone (DESYREL) 100 MG tablet Take 100 mg by mouth at bedtime.   Yes [provider]    Allergies    Patient has no known allergies.  Review of Systems   Review of Systems  All other systems reviewed and are negative.  Physical Exam Updated Vital Signs BP 119/83   Pulse 80   Temp 98.1 F (36.7 C) (Oral)   Resp 17   Ht 5\' 9"  (1.753 m)   Wt 86.2 kg   SpO2 100%   BMI 28.06 kg/m   Physical Exam Vitals and nursing note reviewed.  Constitutional:      General: He is not in acute distress.    Appearance: Normal appearance. He is well-developed. He is obese.  HENT:     Head: Normocephalic and atraumatic.     Mouth/Throat:     Mouth: Mucous membranes are moist.  Eyes:     Conjunctiva/sclera: Conjunctivae normal.     Pupils: Pupils are equal, round, and reactive to light.  Cardiovascular:     Rate and Rhythm: Normal rate and regular rhythm.     Pulses: Normal pulses.     Heart sounds: No murmur heard. Pulmonary:     Effort: Pulmonary effort is normal. No respiratory distress.     Breath sounds: Normal breath sounds. No wheezing or rales.  Abdominal:     General: There is no distension.     Palpations: Abdomen is soft.     Tenderness: There is no abdominal tenderness. There is no guarding or rebound.  Musculoskeletal:        General: Swelling and tenderness present. Normal range of motion.     Cervical back: Normal range of motion and neck supple.     Right lower leg: Edema present.     Left lower leg: Edema present.     Comments: 2+ edema in the right foot to the mid tib-fib area with no erythema, warmth or tenderness.  2+ DP pulse  noted.  3+ edema mildly pitting in the left foot to the mid tib-fib area.  Warmth and erythema noted on the top of the foot.  Pain is worse when trying to range the ankle but is not affected  with moving the great toe.  No open lesions.  1+ DP and PT pulse most likely related to significant swelling.  No knee complaints bilaterally.  Skin:    General: Skin is warm and dry.     Capillary Refill: Capillary refill takes less than 2 seconds.     Findings: No erythema or rash.  Neurological:     Mental Status: He is alert and oriented to person, place, and time. Mental status is at baseline.     Motor: No weakness.  Psychiatric:        Mood and Affect: Mood normal.        Behavior: Behavior normal.    ED Results / Procedures / Treatments   Labs (all labs ordered are listed, but only abnormal results are displayed) Labs Reviewed  CBC WITH DIFFERENTIAL/PLATELET - Abnormal; Notable for the following components:      Result Value   RBC 3.98 (*)    Hemoglobin 11.3 (*)    HCT 36.7 (*)    All other components within normal limits  COMPREHENSIVE METABOLIC PANEL - Abnormal; Notable for the following components:   Glucose, Bld 118 (*)    BUN 25 (*)    Creatinine, Ser 1.45 (*)    GFR, Estimated 52 (*)    All other components within normal limits  BRAIN NATRIURETIC PEPTIDE    EKG EKG Interpretation  Date/Time:  Tuesday December 19 2020 16:20:35 EDT Ventricular Rate:  71 PR Interval:  144 QRS Duration: 110 QT Interval:  383 QTC Calculation: 417 R Axis:   51 Text Interpretation: Sinus rhythm , new Incomplete left bundle branch block since 2013 Low voltage, extremity and precordial leads Baseline wander in lead(s) II aVF Confirmed by Blanchie Dessert [phone]) on 12/19/2020 4:27:13 PM  Radiology DG Chest 2 View  Result Date: 12/19/2020 CLINICAL DATA:  Leg pain swelling. EXAM: CHEST - 2 VIEW COMPARISON:  07/20/2012 FINDINGS: Stable right-sided ventriculoperitoneal shunt catheter coursing down  the right hemithorax. The cardiac silhouette, mediastinal and hilar contours are normal. The lungs are clear. No pulmonary edema or pleural effusions. The bony thorax is intact. IMPRESSION: No acute cardiopulmonary findings. Electronically Signed   By: Marijo Sanes M.D.   On: 12/19/2020 14:03   DG Ankle 2 Views Left  Result Date: 12/19/2020 CLINICAL DATA:  70 y.o. male c/o left foot and ankle pain. Patient denies injury. injury EXAM: LEFT ANKLE - 2 VIEW COMPARISON:  None. FINDINGS: Oblique fracture through the distal fibular metadiaphysis. Horizontal fracture through the medial malleolus. There is distraction of the tibiotalar joint with widening of the joint anteriorly seen on lateral projection. Concern for posterior malleolus fracture. IMPRESSION: 1. Fracture of the distal fibula. 2. Medial malleolar fracture. 3. Widening of the tibiotalar joint anteriorly. 4. Potential posterior malleolar fracture. Electronically Signed   By: Suzy Bouchard M.D.   On: 12/19/2020 18:27   DG Foot Complete Left  Result Date: 12/19/2020 CLINICAL DATA:  Left foot pain and swelling. EXAM: LEFT FOOT - COMPLETE 3+ VIEW COMPARISON:  None. FINDINGS: Ankle fractures are noted. No acute foot fractures are identified. The joint spaces are maintained. Vascular calcifications are noted. Extensive soft tissue swelling noted. IMPRESSION: 1. Ankle fractures. 2. No acute foot fractures. Electronically Signed   By: Marijo Sanes M.D.   On: 12/19/2020 18:21   VAS [place] LOWER EXTREMITY VENOUS (DVT) ([facility] and [facility] 7a-7p)  Result Date: 12/19/2020  Lower Venous DVT Study Patient Name:  Kurt Walton  Date of Exam:  12/19/2020 Medical Rec #: [id number]      Accession #:    [id number] Date of Birth: 1950-10-06      Patient Gender: M Patient Age:   1 years Exam Location:  [facility] Procedure:      VAS [place] LOWER EXTREMITY VENOUS (DVT) Referring Phys: Loree Fee Da Authement  --------------------------------------------------------------------------------  Indications: Edema.  Limitations: Restricted mobility. Comparison Study: No prior study Performing Technologist: Maudry Mayhew MHA, RDMS, RVT, RDCS  Examination Guidelines: A complete evaluation includes B-mode imaging, spectral Doppler, color Doppler, and power Doppler as needed of all accessible portions of each vessel. Bilateral testing is considered an integral part of a complete examination. Limited examinations for reoccurring indications may be performed as noted. The reflux portion of the exam is performed with the patient in reverse Trendelenburg.  Right Technical Findings: Not visualized segments include CFV.  +---------+---------------+---------+-----------+----------+--------------+ LEFT     CompressibilityPhasicitySpontaneityPropertiesThrombus Aging +---------+---------------+---------+-----------+----------+--------------+ CFV      Full           Yes      Yes                                 +---------+---------------+---------+-----------+----------+--------------+ SFJ      Full                                                        +---------+---------------+---------+-----------+----------+--------------+ FV Prox  Full                                                        +---------+---------------+---------+-----------+----------+--------------+ FV Mid   Full                                                        +---------+---------------+---------+-----------+----------+--------------+ FV DistalFull                                                        +---------+---------------+---------+-----------+----------+--------------+ PFV      Full                                                        +---------+---------------+---------+-----------+----------+--------------+ POP      Full           Yes      Yes                                  +---------+---------------+---------+-----------+----------+--------------+ PTV      Full                                                        +---------+---------------+---------+-----------+----------+--------------+  PERO     Full                                                        +---------+---------------+---------+-----------+----------+--------------+     Summary: LEFT: - There is no evidence of deep vein thrombosis in the lower extremity.  - No cystic structure found in the popliteal fossa.  *See table(s) above for measurements and observations. Electronically signed by Deitra Mayo MD on 12/19/2020 at 6:47:45 PM.    Final     Procedures Procedures   Medications Ordered in ED Medications  HYDROcodone-acetaminophen (NORCO/VICODIN) 5-325 MG per tablet 1 tablet (1 tablet Oral Given 12/19/20 1638)  oxyCODONE-acetaminophen (PERCOCET/ROXICET) 5-325 MG per tablet 1 tablet (1 tablet Oral Given 12/19/20 1901)    ED Course  I have reviewed the triage vital signs and the nursing notes.  Pertinent labs & imaging results that were available during my care of the patient were reviewed by me and considered in my medical decision making (see chart for details).    MDM Rules/Calculators/A&P                           Elderly male presenting today with worsening swelling and pain in his left foot and ankle area that has been present now for 3 days.  Patient has had pitting edema in bilateral lower extremities for the last year most likely related to venous insufficiency and inactivity.  He did have compression socks but does not wear them regularly and now his legs are too swollen to wear them at all.  However in the last 3 days there has been erythema pain and worsening swelling in the left foot.  Daughter denies any known trauma.  He has not had any infectious complaints.  There is no signs of wounds to the area.  Concern for DVT given patient's history of being an active versus  gout.  He does take chlorthalidone which would increase his risk.  He denies any chest pain or shortness of breath and EKG with an incomplete right bundle branch block change from 2013 but otherwise no acute findings.  BNP is within normal limits.  Renal function 1.45 which is most likely baseline and white count and hemoglobin are normal.  Low suspicion for CHF, cellulitis or septic joint.  Ultrasound to rule out DVT pending and plain film to ensure there is no traumatic injury that was unknown.  Pt given pain control.  7:59 PM Vascular ultrasound is negative for DVT.  Imaging shows a distal fibula and medial malleoli are fracture.  Spoke with Dr. Lyla Glassing who recommended posterior and U-splint and CT with follow-up with Dr. Doran Durand.  Patient was given pain control.  He will need to elevate the leg.  He does have a wheelchair at home and his daughter can care for him.  MDM   Amount and/or Complexity of Data Reviewed Clinical lab tests: ordered and reviewed Tests in the radiology section of CPT: ordered and reviewed Tests in the medicine section of CPT: ordered and reviewed Independent visualization of images, tracings, or specimens: yes     Final Clinical Impression(s) / ED Diagnoses Final diagnoses:  Pain  Closed bimalleolar fracture with fracture of distal fibula    Rx / DC Orders ED Discharge Orders  None        Blanchie Dessert, MD 12/19/20 2000

## 2020-12-19 NOTE — ED Notes (Signed)
Ortho Tech is here.

## 2020-12-19 NOTE — Progress Notes (Signed)
Left lower extremity venous duplex completed. Refer to "CV Proc" under chart review to view preliminary results.  12/19/2020 6:04 PM Kelby Aline., MHA, RVT, RDCS, RDMS

## 2020-12-19 NOTE — Discharge Instructions (Addendum)
He will need to elevate your leg and do not put any weight on it.  You need to use your wheelchair until you see Dr. Doran Durand.

## 2020-12-19 NOTE — Progress Notes (Signed)
Orthopedic Tech Progress Note Patient Details:  Kurt Walton [birth year] [id number]  Ortho Devices Type of Ortho Device: Post (short leg) splint, Stirrup splint Ortho Device/Splint Location: lle Ortho Device/Splint Interventions: Ordered, Application, Adjustment   Post Interventions Patient Tolerated: Well Instructions Provided: Care of device, Poper ambulation with device  [name] 12/19/2020, 11:39 PM

## 2020-12-19 NOTE — ED Notes (Signed)
Family at bedside. 

## 2020-12-20 ENCOUNTER — Other Ambulatory Visit: Payer: Self-pay | Admitting: Hospitalist

## 2020-12-21 ENCOUNTER — Other Ambulatory Visit (HOSPITAL_COMMUNITY): Payer: Self-pay | Admitting: Hospitalist

## 2020-12-21 ENCOUNTER — Other Ambulatory Visit: Payer: Self-pay | Admitting: Hospitalist

## 2020-12-21 DIAGNOSIS — Z9181 History of falling: Secondary | ICD-10-CM

## 2020-12-26 ENCOUNTER — Ambulatory Visit (HOSPITAL_COMMUNITY): Admission: RE | Admit: 2020-12-26 | Payer: Medicare (Managed Care) | Source: Ambulatory Visit

## 2020-12-26 ENCOUNTER — Other Ambulatory Visit: Payer: Self-pay | Admitting: Orthopaedic Surgery

## 2020-12-27 ENCOUNTER — Encounter (HOSPITAL_COMMUNITY): Payer: Self-pay

## 2020-12-27 ENCOUNTER — Other Ambulatory Visit (HOSPITAL_COMMUNITY): Payer: Medicare (Managed Care)

## 2021-01-03 NOTE — Progress Notes (Signed)
Upon speaking with the pt's daughter [name], the pt is currently in a rehab facility at [facility]. Daughter updated on the surgery and arrival time. [name] states she needs to accompany her father to the re-op area due to him not knowing his full medical/surgical history and being hard of hearing.

## 2021-01-03 NOTE — Progress Notes (Addendum)
Spoke with Nira Conn with [employer] transportation service, and the arrival time given. Also spoke with Elmyra Ricks, RN for [facility], and she was updated as well. A fax will be sent with pre-op instructions for the pt. Awaiting a call back confirming receipt. [facility] [phone] or [phone], fax [phone] or [phone]

## 2021-01-03 NOTE — Anesthesia Preprocedure Evaluation (Addendum)
Anesthesia Evaluation  Patient identified by MRN, date of birth, ID band Patient awake    Reviewed: Allergy & Precautions, NPO status , Patient's Chart, lab work & pertinent test results  History of Anesthesia Complications Negative for: history of anesthetic complications  Airway Mallampati: III  TM Distance: >3 FB Neck ROM: Full    Dental  (+) Chipped, Poor Dentition, Dental Advisory Given,    Pulmonary neg shortness of breath, neg sleep apnea, neg COPD, neg recent URI, former smoker,    breath sounds clear to auscultation       Cardiovascular hypertension, Pt. on medications (-) angina(-) Past MI and (-) CHF (-) dysrhythmias  Rhythm:Regular     Neuro/Psych H/o brain aneurysm with hydrocephalus, VP shunt, HOH negative neurological ROS  negative psych ROS   GI/Hepatic negative GI ROS, Neg liver ROS,   Endo/Other  negative endocrine ROS  Renal/GU Renal diseaseLab Results      Component                Value               Date                      CREATININE               1.45 (H)            12/19/2020                Musculoskeletal LEFT TRIMALLEOLAR ANKLE FRACTURE, POSSIBLE SYNDESMOTIC DISRUPTION   Abdominal   Peds  Hematology  (+) Blood dyscrasia, anemia , Lab Results      Component                Value               Date                      WBC                      6.0                 12/19/2020                HGB                      11.3 (L)            12/19/2020                HCT                      36.7 (L)            12/19/2020                MCV                      92.2                12/19/2020                PLT                      197                 12/19/2020  Anesthesia Other Findings   Reproductive/Obstetrics                           Anesthesia Physical Anesthesia Plan  ASA: 3  Anesthesia Plan: MAC and Regional   Post-op Pain Management:     Induction:   PONV Risk Score and Plan: 1 and Propofol infusion and Treatment may vary due to age or medical condition  Airway Management Planned: Nasal Cannula  Additional Equipment: None  Intra-op Plan:   Post-operative Plan:   Informed Consent: I have reviewed the patients History and Physical, chart, labs and discussed the procedure including the risks, benefits and alternatives for the proposed anesthesia with the patient or authorized representative who has indicated his/her understanding and acceptance.     Dental advisory given and Consent reviewed with POA  Plan Discussed with: CRNA and Anesthesiologist  Anesthesia Plan Comments: (See APP note by Durel Salts, FNP )       Anesthesia Quick Evaluation

## 2021-01-03 NOTE — Progress Notes (Signed)
Anesthesia Chart Review:  Pt is a same day work up   Case: [id number] Date/Time: 01/04/21 1516   Procedure: OPEN REDUCTION INTERNAL FIXATION (ORIF) ANKLE FRACTURE (Left: Ankle) - LENGHT OF SURGERY: 90 MINUTES   Anesthesia type: General   Pre-op diagnosis: LEFT TRIMALLEOLAR ANKLE FRACTURE, POSSIBLE SYNDESMOTIC DISRUPTION   Location: [facility] ROOM 06 / [place]   Surgeons: Erle Crocker, MD       DISCUSSION: Pt is 70 years old with hx HTN and cerebral aneurysm at age 16 with VP shunt (new device placed in 2013 but old device left in place is not compatible with MRI  Pt is hard of hearing  Reviewed EKG with Dr. Doroteo Glassman.    PROVIDERS: - PCP is Janifer Adie, MD   LABS: Labs reviewed: Acceptable for surgery. - CMP 12/19/20 acceptable for surgery - CBC 12/19/20 acceptable for surgery   IMAGES: CXR 12/19/20: No acute cardiopulmonary findings   EKG 12/19/20:  Sinus rhythm new Incomplete left bundle branch block since 2013 Low voltage, extremity and precordial leads Baseline wander in lead(s) II aVF  CV: Echo 05/05/20:   1. Left ventricular ejection fraction, by estimation, is 60 to 65%. The left ventricle has normal function. The left ventricle has no regional wall motion abnormalities. Left ventricular diastolic parameters were normal.   2. Right ventricular systolic function is normal. The right ventricular size is normal. Tricuspid regurgitation signal is inadequate for assessing PA pressure.   3. The mitral valve is normal in structure. Trivial mitral valve regurgitation. No evidence of mitral stenosis.   4. The aortic valve is normal in structure. Aortic valve regurgitation is not visualized. No aortic stenosis is present.   Past Medical History:  Diagnosis Date   Abnormal gait    Cerebral aneurysm    age 85 with VP shunt   Hard of hearing    Hx of adenomatous colonic polyps 01/29/2016   Hypertension    Substance abuse ([place])    ETOH, past   Urinary incontinence      Past Surgical History:  Procedure Laterality Date   HIP SURGERY     TUMOR REMOVAL     not tumor, but aneurysm   VENTRICULOPERITONEAL SHUNT  10/17/2011   Procedure: SHUNT INSERTION VENTRICULAR-PERITONEAL;  Surgeon: Ophelia Charter, MD;  Location: [facility];  Service: Neurosurgery;  Laterality: Right;  Instertion of Ventricular-Peritoneal Shunt   VENTRICULOPERITONEAL SHUNT     once at age 70 and replaced 2013.    MEDICATIONS:  0.9 %  sodium chloride infusion    acetaminophen (TYLENOL) 650 MG CR tablet   aspirin EC 81 MG tablet   bisacodyl (DULCOLAX) 10 MG suppository   Camphor-Menthol-Methyl Sal (SALONPAS) 3.03-30-08 % PTCH   chlorthalidone (HYGROTON) 25 MG tablet   chlorthalidone (HYGROTON) 25 MG tablet   diclofenac Sodium (VOLTAREN) 1 % GEL   gabapentin (NEURONTIN) 600 MG tablet   HYDROcodone-acetaminophen (NORCO/VICODIN) 5-325 MG tablet   lisinopril (ZESTRIL) 20 MG tablet   magnesium hydroxide (MILK OF MAGNESIA) 400 MG/5ML suspension   magnesium hydroxide (MILK OF MAGNESIA) 400 MG/5ML suspension   Menthol, Topical Analgesic, (BIOFREEZE) 4 % GEL   pravastatin (PRAVACHOL) 40 MG tablet   senna-docusate (SENOKOT-S) 8.6-50 MG tablet   Sodium Phosphates (RA SALINE ENEMA) 19-7 GM/118ML ENEM   traZODone (DESYREL) 100 MG tablet   Vitamin D, Ergocalciferol, (DRISDOL) 1.25 MG (50000 UNIT) CAPS capsule   oxyCODONE-acetaminophen (PERCOCET/ROXICET) 5-325 MG tablet    If no changes, I anticipate pt can  proceed with surgery as scheduled.   Willeen Cass, PhD, FNP-BC [facility]/Anesthesiology Phone: [phone] 01/03/2021 1:03 PM

## 2021-01-03 NOTE — Pre-Procedure Instructions (Signed)
    Kurt Walton  01/03/2021     Your procedure is scheduled on Thurs., Oct. 13, 2022 from 3:31PM-5:04PM.  Report to [facility] Entrance "A" at 1:00PM  Call this number if you have problems the morning of surgery:  [phone]   Remember:  Do not eat after midnight on Oct. 12th  You may drink clear liquids until 3 hours (12:30PM) prior to surgery time .  Clear liquids allowed are: Water, Juice(no pulp, non-citric), Black Coffee(no dairy or creamer), Clear Tea(no dairy or creamer), Carbonated Beverages, Gatorade, Plain Jell-O, Plain Popsicles.                     Take these medicines the morning of surgery with A SIP OF WATER: (Please provide the times the medicine(s) were given, so that we can document accordingly.) Acetaminophen (TYLENOL) Pravastatin (PRAVACHOL)  If Needed: HYDROcodone-acetaminophen (NORCO/VICODIN)- Do not take if already taken your Tylenol  As of today, STOP taking all Aspirin (unless instructed by your doctor) and Other Aspirin containing products, Vitamins, Fish oils, and Herbal medications. Also stop all NSAIDS i.e. Advil, Ibuprofen, Motrin, Aleve, Anaprox, Naproxen, BC, Goody Powders, and all Supplements.    No Smoking of any kind, Tobacco/Vaping, or Alcohol products 24 hours prior to your procedure. If you use a Cpap at night, you may bring all equipment for your overnight stay.    Day of Surgery:  Do not wear jewelry.  Do not wear lotions, powders, or colognes, or deodorant.  Do not shave 48 hours prior to surgery.  Men may shave face and neck.  Do not bring valuables to the hospital.  [facility] is not responsible for any belongings or valuables.  Contacts, dentures or bridgework may not be worn into surgery.    For patients admitted to the hospital, discharge time will be determined by your treatment team.  Patients discharged the day of surgery will not be allowed to drive home, and someone age 84 and over needs to stay with them for 24  hours.  Oral Hygiene is also important to reduce your risk of infection.  Remember - BRUSH YOUR TEETH THE MORNING OF SURGERY WITH YOUR REGULAR TOOTHPASTE  Special instructions:  [facility]- Preparing For Surgery  Before surgery, you can play an important role. Because skin is not sterile, your skin needs to be as free of germs as possible. You can reduce the number of germs on your skin by washing with an Antibacterial Soap before surgery. You can use regular soap if needed.    Please follow these instructions carefully.   Shower the NIGHT BEFORE SURGERY or the MORNING OF SURGERY with an Antibacterial Soap (if you have it), or regular soap.   Reminders: Do not apply any deodorants/lotions.  Please wear clean clothes to the hospital/surgery center.   Remember to brush your teeth WITH YOUR REGULAR TOOTHPASTE.  Please read over the following fact sheets that you were given.

## 2021-01-03 NOTE — Progress Notes (Signed)
Elmyra Ricks, RN for [name] confirmed receipt of the faxed pre-op instructions.

## 2021-01-04 ENCOUNTER — Ambulatory Visit (HOSPITAL_COMMUNITY): Payer: Medicare (Managed Care) | Admitting: Emergency Medicine

## 2021-01-04 ENCOUNTER — Ambulatory Visit (HOSPITAL_COMMUNITY): Payer: Medicare (Managed Care)

## 2021-01-04 ENCOUNTER — Other Ambulatory Visit: Payer: Self-pay

## 2021-01-04 ENCOUNTER — Ambulatory Visit (HOSPITAL_COMMUNITY)
Admission: RE | Admit: 2021-01-04 | Discharge: 2021-01-04 | Disposition: A | Discharge status: Home / Self Care | Payer: Medicare (Managed Care) | Attending: Orthopaedic Surgery | Admitting: Orthopaedic Surgery

## 2021-01-04 ENCOUNTER — Encounter (HOSPITAL_COMMUNITY)
Admission: RE | Disposition: A | Discharge status: Home / Self Care | Payer: Self-pay | Source: Home / Self Care | Attending: Orthopaedic Surgery

## 2021-01-04 ENCOUNTER — Encounter (HOSPITAL_COMMUNITY): Payer: Self-pay | Admitting: Orthopaedic Surgery

## 2021-01-04 DIAGNOSIS — Z7982 Long term (current) use of aspirin: Secondary | ICD-10-CM | POA: Diagnosis not present

## 2021-01-04 DIAGNOSIS — S82852A Displaced trimalleolar fracture of left lower leg, initial encounter for closed fracture: Secondary | ICD-10-CM | POA: Insufficient documentation

## 2021-01-04 DIAGNOSIS — Z79899 Other long term (current) drug therapy: Secondary | ICD-10-CM | POA: Insufficient documentation

## 2021-01-04 DIAGNOSIS — Z87891 Personal history of nicotine dependence: Secondary | ICD-10-CM | POA: Diagnosis not present

## 2021-01-04 DIAGNOSIS — Z419 Encounter for procedure for purposes other than remedying health state, unspecified: Secondary | ICD-10-CM

## 2021-01-04 DIAGNOSIS — W19XXXA Unspecified fall, initial encounter: Secondary | ICD-10-CM | POA: Insufficient documentation

## 2021-01-04 HISTORY — PX: ORIF ANKLE FRACTURE: SHX5408

## 2021-01-04 LAB — SURGICAL PCR SCREEN
MRSA, PCR: NEGATIVE
Staphylococcus aureus: NEGATIVE

## 2021-01-04 SURGERY — OPEN REDUCTION INTERNAL FIXATION (ORIF) ANKLE FRACTURE
Anesthesia: Monitor Anesthesia Care | Site: Ankle | Laterality: Left

## 2021-01-04 MED ORDER — FENTANYL CITRATE (PF) 100 MCG/2ML IJ SOLN: 25.0000 ug | INTRAMUSCULAR | Status: DC | PRN

## 2021-01-04 MED ORDER — PROPOFOL 10 MG/ML IV BOLUS
INTRAVENOUS | Status: DC | PRN
  Administered 2021-01-04: 30 mg via INTRAVENOUS

## 2021-01-04 MED ORDER — LIDOCAINE 2% (20 MG/ML) 5 ML SYRINGE
INTRAMUSCULAR | Status: DC | PRN
  Administered 2021-01-04: 60 mg via INTRAVENOUS

## 2021-01-04 MED ORDER — OXYCODONE HCL 5 MG PO TABS: 5.0000 mg | ORAL_TABLET | Freq: Once | ORAL | Status: DC | PRN

## 2021-01-04 MED ORDER — ORAL CARE MOUTH RINSE: 15.0000 mL | Freq: Once | OROMUCOSAL | Status: AC

## 2021-01-04 MED ORDER — LACTATED RINGERS IV SOLN: INTRAVENOUS | Status: DC

## 2021-01-04 MED ORDER — OXYCODONE HCL 5 MG/5ML PO SOLN: 5.0000 mg | Freq: Once | ORAL | Status: DC | PRN

## 2021-01-04 MED ORDER — FENTANYL CITRATE (PF) 100 MCG/2ML IJ SOLN
INTRAMUSCULAR | Status: AC
  Administered 2021-01-04: 50 ug via INTRAVENOUS
  Filled 2021-01-04: qty 2

## 2021-01-04 MED ORDER — ACETAMINOPHEN 10 MG/ML IV SOLN: 1000.0000 mg | Freq: Once | INTRAVENOUS | Status: DC | PRN

## 2021-01-04 MED ORDER — EPHEDRINE SULFATE-NACL 50-0.9 MG/10ML-% IV SOSY
PREFILLED_SYRINGE | INTRAVENOUS | Status: DC | PRN
  Administered 2021-01-04: 5 mg via INTRAVENOUS

## 2021-01-04 MED ORDER — CHLORHEXIDINE GLUCONATE 0.12 % MT SOLN
15.0000 mL | Freq: Once | OROMUCOSAL | Status: AC
  Administered 2021-01-04: 15 mL via OROMUCOSAL
  Filled 2021-01-04: qty 15

## 2021-01-04 MED ORDER — ACETAMINOPHEN 160 MG/5ML PO SOLN: 1000.0000 mg | Freq: Once | ORAL | Status: DC | PRN

## 2021-01-04 MED ORDER — LIDOCAINE-EPINEPHRINE (PF) 1.5 %-1:200000 IJ SOLN
INTRAMUSCULAR | Status: DC | PRN
  Administered 2021-01-04 (×2): 5 mL via PERINEURAL

## 2021-01-04 MED ORDER — PHENYLEPHRINE HCL (PRESSORS) 10 MG/ML IV SOLN
INTRAVENOUS | Status: DC | PRN
  Administered 2021-01-04: 40 ug via INTRAVENOUS

## 2021-01-04 MED ORDER — BUPIVACAINE-EPINEPHRINE (PF) 0.5% -1:200000 IJ SOLN
INTRAMUSCULAR | Status: DC | PRN
  Administered 2021-01-04: 5 mL via PERINEURAL
  Administered 2021-01-04: 25 mL via PERINEURAL

## 2021-01-04 MED ORDER — CEFAZOLIN SODIUM-DEXTROSE 2-4 GM/100ML-% IV SOLN
2.0000 g | INTRAVENOUS | Status: AC
  Administered 2021-01-04: 2 g via INTRAVENOUS
  Filled 2021-01-04: qty 100

## 2021-01-04 MED ORDER — PROPOFOL 500 MG/50ML IV EMUL
INTRAVENOUS | Status: DC | PRN
  Administered 2021-01-04: 75 ug/kg/min via INTRAVENOUS

## 2021-01-04 MED ORDER — 0.9 % SODIUM CHLORIDE (POUR BTL) OPTIME
TOPICAL | Status: DC | PRN
  Administered 2021-01-04: 1000 mL

## 2021-01-04 MED ORDER — FENTANYL CITRATE (PF) 100 MCG/2ML IJ SOLN: 50.0000 ug | Freq: Once | INTRAMUSCULAR | Status: AC

## 2021-01-04 MED ORDER — PHENYLEPHRINE HCL-NACL 20-0.9 MG/250ML-% IV SOLN
INTRAVENOUS | Status: DC | PRN
  Administered 2021-01-04: 25 ug/min via INTRAVENOUS

## 2021-01-04 MED ORDER — ACETAMINOPHEN 500 MG PO TABS: 1000.0000 mg | ORAL_TABLET | Freq: Once | ORAL | Status: DC | PRN

## 2021-01-04 MED ORDER — OXYCODONE HCL 5 MG PO TABS: 5.0000 mg | ORAL_TABLET | Freq: Three times a day (TID) | ORAL | 0 refills | Status: AC | PRN

## 2021-01-04 MED ORDER — MIDAZOLAM HCL 2 MG/2ML IJ SOLN
INTRAMUSCULAR | Status: AC
  Filled 2021-01-04: qty 2

## 2021-01-04 SURGICAL SUPPLY — 67 items
ALCOHOL 70% 16 OZ (MISCELLANEOUS) ×2 IMPLANT
BAG COUNTER SPONGE SURGICOUNT (BAG) ×2 IMPLANT
BANDAGE ESMARK 6X9 LF (GAUZE/BANDAGES/DRESSINGS) IMPLANT
BIT DRILL 2 CANN GRADUATED (BIT) ×2 IMPLANT
BIT DRILL 2.5 CANN LNG (BIT) ×2 IMPLANT
BIT DRILL 2.5 CANN STRL (BIT) ×2 IMPLANT
BIT DRILL 2.6 CANN (BIT) ×2 IMPLANT
BLADE SURG 15 STRL LF DISP TIS (BLADE) ×1 IMPLANT
BLADE SURG 15 STRL SS (BLADE) ×1
BNDG COHESIVE 4X5 TAN STRL (GAUZE/BANDAGES/DRESSINGS) IMPLANT
BNDG COHESIVE 6X5 TAN STRL LF (GAUZE/BANDAGES/DRESSINGS) IMPLANT
BNDG ELASTIC 6X10 VLCR STRL LF (GAUZE/BANDAGES/DRESSINGS) ×2 IMPLANT
BNDG ESMARK 6X9 LF (GAUZE/BANDAGES/DRESSINGS)
CANISTER SUCT 3000ML PPV (MISCELLANEOUS) ×2 IMPLANT
CHLORAPREP W/TINT 26 (MISCELLANEOUS) ×4 IMPLANT
COVER SURGICAL LIGHT HANDLE (MISCELLANEOUS) ×2 IMPLANT
CUFF TOURN SGL QUICK 34 (TOURNIQUET CUFF) ×1
CUFF TOURN SGL QUICK 42 (TOURNIQUET CUFF) IMPLANT
CUFF TRNQT CYL 34X4.125X (TOURNIQUET CUFF) ×1 IMPLANT
DRAPE C-ARM 42X120 X-RAY (DRAPES) ×2 IMPLANT
DRAPE C-ARMOR (DRAPES) ×2 IMPLANT
DRAPE OEC MINIVIEW 54X84 (DRAPES) IMPLANT
DRAPE U-SHAPE 47X51 STRL (DRAPES) ×2 IMPLANT
DRSG PAD ABDOMINAL 8X10 ST (GAUZE/BANDAGES/DRESSINGS) ×4 IMPLANT
DRSG XEROFORM 1X8 (GAUZE/BANDAGES/DRESSINGS) ×2 IMPLANT
ELECT REM PT RETURN 9FT ADLT (ELECTROSURGICAL) ×2
ELECTRODE REM PT RTRN 9FT ADLT (ELECTROSURGICAL) ×1 IMPLANT
GAUZE SPONGE 4X4 12PLY STRL (GAUZE/BANDAGES/DRESSINGS) IMPLANT
GAUZE SPONGE 4X4 12PLY STRL LF (GAUZE/BANDAGES/DRESSINGS) ×2 IMPLANT
GLOVE SRG 8 PF TXTR STRL LF DI (GLOVE) ×1 IMPLANT
GLOVE SURG ENC TEXT LTX SZ7.5 (GLOVE) ×2 IMPLANT
GLOVE SURG UNDER POLY LF SZ8 (GLOVE) ×1
GOWN STRL REUS W/ TWL LRG LVL3 (GOWN DISPOSABLE) ×1 IMPLANT
GOWN STRL REUS W/ TWL XL LVL3 (GOWN DISPOSABLE) ×1 IMPLANT
GOWN STRL REUS W/TWL LRG LVL3 (GOWN DISPOSABLE) ×1
GOWN STRL REUS W/TWL XL LVL3 (GOWN DISPOSABLE) ×1
GUIDEWIRE 1.35MM (WIRE) ×4 IMPLANT
GUIDEWIRE 1.6 (WIRE) ×1
GUIDEWIRE ORTH 157X1.6XTROC (WIRE) ×1 IMPLANT
KIT BASIN OR (CUSTOM PROCEDURE TRAY) ×2 IMPLANT
KIT TURNOVER KIT B (KITS) ×2 IMPLANT
NS IRRIG 1000ML POUR BTL (IV SOLUTION) ×2 IMPLANT
PACK ORTHO EXTREMITY (CUSTOM PROCEDURE TRAY) ×2 IMPLANT
PAD ARMBOARD 7.5X6 YLW CONV (MISCELLANEOUS) ×4 IMPLANT
PAD CAST 4YDX4 CTTN HI CHSV (CAST SUPPLIES) ×1 IMPLANT
PADDING CAST COTTON 4X4 STRL (CAST SUPPLIES) ×1
PADDING CAST SYNTHETIC 4 (CAST SUPPLIES) ×4
PADDING CAST SYNTHETIC 4X4 STR (CAST SUPPLIES) ×4 IMPLANT
PLATE LOCK THIRD TI 8H (Plate) ×2 IMPLANT
SCREW CANN TI ST QF 4X42 (Screw) ×4 IMPLANT
SCREW CORT TI FT 3.5X38 (Screw) ×2 IMPLANT
SCREW LOCKING HEX 3.5X16 (Screw) ×4 IMPLANT
SCREW LP TI 3.5X14MM (Screw) ×6 IMPLANT
SPLINT PLASTER CAST XFAST 5X30 (CAST SUPPLIES) ×1 IMPLANT
SPLINT PLASTER XFAST SET 5X30 (CAST SUPPLIES) ×1
SPONGE T-LAP 18X18 ~~LOC~~+RFID (SPONGE) ×2 IMPLANT
SUCTION FRAZIER HANDLE 10FR (MISCELLANEOUS) ×1
SUCTION TUBE FRAZIER 10FR DISP (MISCELLANEOUS) ×1 IMPLANT
SUT ETHILON 3 0 PS 1 (SUTURE) ×2 IMPLANT
SUT FIBERTAPE CERCLAGE 2 48 (SUTURE) ×2 IMPLANT
SUT MON AB 3-0 SH 27 (SUTURE) ×1
SUT MON AB 3-0 SH27 (SUTURE) ×1 IMPLANT
SUT VIC AB 2-0 CT1 27 (SUTURE) ×2
SUT VIC AB 2-0 CT1 TAPERPNT 27 (SUTURE) ×2 IMPLANT
TOWEL GREEN STERILE (TOWEL DISPOSABLE) ×2 IMPLANT
TOWEL GREEN STERILE FF (TOWEL DISPOSABLE) ×2 IMPLANT
TUBE CONNECTING 12X1/4 (SUCTIONS) ×2 IMPLANT

## 2021-01-04 NOTE — H&P (Signed)
PREOPERATIVE H&P  Chief Complaint: Left ankle pain  HPI: Kurt Walton is a 70 y.o. male who presents for preoperative history and physical with a diagnosis of left trimalleolar ankle fracture.  Patient sustained this after a fall.. Symptoms are rated as moderate to severe, and have been worsening.  This is significantly impairing activities of daily living.  He has elected for surgical management.   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   Social History   Socioeconomic History   Marital status: Single    Spouse name: Not on file   Number of children: Not on file   Years of education: Not on file   Highest education level: Not on file  Occupational History   Not on file  Tobacco Use   Smoking status: Former    Packs/day: 1.00    Types: Cigarettes    Quit date: 11/11/2011    Years since quitting: 9.1   Smokeless tobacco: Never  Substance and Sexual Activity   Alcohol use: No    Comment: hx of ETOH abuse   Drug use: No   Sexual activity: Not on file  Other Topics Concern   Not on file  Social History Narrative   Not on file   Social Determinants of Health   Financial Resource Strain: Not on file  Food Insecurity: Not on file  Transportation Needs: Not on file  Physical Activity: Not on file  Stress: Not on file  Social Connections: Not on file    Family History  Problem Relation Age of Onset   Colon cancer Neg Hx    No Known Allergies Prior to Admission medications   Medication Sig Start Date End Date Taking? Authorizing Provider  acetaminophen (TYLENOL) 650 MG CR tablet Take 650 mg by mouth in the morning, at noon, and at bedtime.   Yes [provider]  aspirin EC 81 MG tablet Take 81 mg by mouth in the morning. Swallow whole.   Yes [provider]  diclofenac Sodium (VOLTAREN) 1 % GEL Apply 4 g topically 4 (four) times daily as needed (pain).   Yes [provider]  gabapentin (NEURONTIN) 600 MG tablet Take 600 mg by mouth at bedtime.   Yes [provider]  HYDROcodone-acetaminophen (NORCO/VICODIN) 5-325 MG tablet Take 1 tablet by mouth every 8 (eight) hours as needed (pain.).   Yes [provider]  lisinopril (ZESTRIL) 20 MG tablet Take 20 mg by mouth in the morning.   Yes [provider]  pravastatin (PRAVACHOL) 40 MG tablet Take 40 mg by mouth daily.   Yes [provider]  senna-docusate (SENOKOT-S) 8.6-50 MG tablet Take 1 tablet by mouth at bedtime.   Yes [provider]  traZODone (DESYREL) 100 MG tablet Take 100 mg by  mouth at bedtime.   Yes [provider]  Vitamin D, Ergocalciferol, (DRISDOL) 1.25 MG (50000 UNIT) CAPS capsule Take 50,000 Units by mouth every 30 (thirty) days.   Yes [provider]  bisacodyl (DULCOLAX) 10 MG suppository Place 10 mg rectally daily as needed for moderate constipation.    [provider]  Camphor-Menthol-Methyl Sal (SALONPAS) 3.03-30-08 % PTCH Place 1 patch onto the skin every 8 (eight) hours as needed (pain).    [provider]  chlorthalidone (HYGROTON) 25 MG tablet Take 25 mg by mouth in the morning.    [provider]  chlorthalidone (HYGROTON) 25 MG tablet Take 25 mg by mouth daily.    [provider]  magnesium hydroxide (MILK OF MAGNESIA) 400 MG/5ML suspension Take  15 mLs by mouth at bedtime as needed (constipation).    [provider]  magnesium hydroxide (MILK OF MAGNESIA) 400 MG/5ML suspension Take 30 mLs by mouth daily as needed for mild constipation.    [provider]  Menthol, Topical Analgesic, (BIOFREEZE) 4 % GEL Apply 1 application topically in the morning, at noon, in the evening, and at bedtime.    [provider]  oxyCODONE-acetaminophen (PERCOCET/ROXICET) 5-325 MG tablet Take 1 tablet by mouth every 6 (six) hours as needed for severe pain. Patient not taking: Reported on 01/03/2021 12/19/20   Blanchie Dessert, MD  Sodium Phosphates (RA SALINE ENEMA) 19-7 GM/118ML ENEM Place 1 Dose rectally daily as needed (constipation).    [provider]     Positive ROS: All other systems have been reviewed and were otherwise negative with the exception of those mentioned in the HPI and as above.  Physical Exam:  Vitals:   01/04/21 1303  BP: 107/62  Pulse: 71  Resp: 19  Temp: 97.9 F (36.6 C)  SpO2: 99%   General: Alert, no acute distress Cardiovascular: No pedal edema Respiratory: No cyanosis, no use of accessory musculature GI: No organomegaly, abdomen is soft and non-tender Skin: No lesions in the area of chief complaint Neurologic: Sensation intact distally Psychiatric: Patient is competent for consent with normal mood and affect Lymphatic: No axillary or cervical lymphadenopathy  MUSCULOSKELETAL: Left ankle in a short leg splint.  Swelling of the foot.  Swelling proximal to the splint.  Clinical alignment is acceptable.  Endorses intact sensation to the toes.  Toes are warm and well perfused.  Assessment: Left trimalleolar ankle fracture   Plan: Plan for open treatment of his left trimalleolar ankle fracture and possible syndesmosis.  We discussed the risks, benefits and alternatives of surgery which include but are not limited to wound healing complications, infection, nonunion, malunion, need for  further surgery, damage to surrounding structures and continued pain.  They understand there is no guarantees to an acceptable outcome.  After weighing these risks they opted to proceed with surgery.     Erle Crocker, MD    01/04/2021 3:30 PM

## 2021-01-04 NOTE — Anesthesia Postprocedure Evaluation (Signed)
Anesthesia Post Note  Patient: Kurt Walton  Procedure(s) Performed: OPEN REDUCTION INTERNAL FIXATION (ORIF) ANKLE FRACTURE (Left: Ankle)     Patient location during evaluation: PACU Anesthesia Type: Regional Level of consciousness: awake and alert Pain management: pain level controlled Vital Signs Assessment: post-procedure vital signs reviewed and stable Respiratory status: spontaneous breathing, nonlabored ventilation, respiratory function stable and patient connected to nasal cannula oxygen Cardiovascular status: stable and blood pressure returned to baseline Postop Assessment: no apparent nausea or vomiting Anesthetic complications: no   No notable events documented.  Last Vitals:  Vitals:   01/04/21 1745 01/04/21 1800  BP: (!) 109/58 110/78  Pulse: 89 85  Resp: 19 12  Temp:  37 C  SpO2: 100% 100%    Last Pain:  Vitals:   01/04/21 1800  TempSrc:   PainSc: 0-No pain                 Effie Berkshire

## 2021-01-04 NOTE — Anesthesia Procedure Notes (Signed)
Anesthesia Regional Block: Popliteal block   Pre-Anesthetic Checklist: , timeout performed,  Correct Patient, Correct Site, Correct Laterality,  Correct Procedure, Correct Position, site marked,  Risks and benefits discussed,  Surgical consent,  Pre-op evaluation,  At surgeon's request and post-op pain management  Laterality: Right and Lower  Prep: chloraprep       Needles:  Injection technique: Single-shot      Needle Length: 9cm  Needle Gauge: 22     Additional Needles: Arrow StimuQuik ECHO Echogenic Stimulating PNB Needle  Procedures:,,,, ultrasound used (permanent image in chart),,    Narrative:  Start time: 01/04/2021 3:37 PM End time: 01/04/2021 3:41 PM Injection made incrementally with aspirations every 5 mL.  Performed by: Personally  Anesthesiologist: Oleta Mouse, MD

## 2021-01-04 NOTE — Anesthesia Procedure Notes (Signed)
Anesthesia Regional Block: Adductor canal block   Pre-Anesthetic Checklist: , timeout performed,  Correct Patient, Correct Site, Correct Laterality,  Correct Procedure, Correct Position, site marked,  Risks and benefits discussed,  Surgical consent,  Pre-op evaluation,  At surgeon's request and post-op pain management  Laterality: Right and Lower  Prep: chloraprep       Needles:  Injection technique: Single-shot      Needle Length: 9cm  Needle Gauge: 22     Additional Needles: Arrow StimuQuik ECHO Echogenic Stimulating PNB Needle  Procedures:,,,, ultrasound used (permanent image in chart),,    Narrative:  Start time: 01/04/2021 3:26 PM End time: 01/04/2021 3:31 PM Injection made incrementally with aspirations every 5 mL.  Performed by: Personally  Anesthesiologist: Oleta Mouse, MD

## 2021-01-04 NOTE — Transfer of Care (Signed)
Immediate Anesthesia Transfer of Care Note  Patient: Kurt Walton  Procedure(s) Performed: OPEN REDUCTION INTERNAL FIXATION (ORIF) ANKLE FRACTURE (Left: Ankle)  Patient Location: PACU  Anesthesia Type:MAC  Level of Consciousness: drowsy  Airway & Oxygen Therapy: Patient Spontanous Breathing and Patient connected to nasal cannula oxygen  Post-op Assessment: Report given to RN and Post -op Vital signs reviewed and stable  Post vital signs: Reviewed and stable  Last Vitals:  Vitals Value Taken Time  BP 107/59 01/04/21 1729  Temp    Pulse 85 01/04/21 1731  Resp 16 01/04/21 1731  SpO2 100 % 01/04/21 1731  Vitals shown include unvalidated device data.  Last Pain:  Vitals:   01/04/21 1312  TempSrc:   PainSc: 0-No pain         Complications: No notable events documented.

## 2021-01-04 NOTE — Op Note (Signed)
Kurt Walton male 70 y.o. 01/04/2021  PreOperative Diagnosis: Left trimalleolar ankle fracture  PostOperative Diagnosis: Left trimalleolar ankle fracture Syndesmosis disruption  PROCEDURE: Open treatment of left trimalleolar ankle fracture without posterior malleolar fixation Open treatment of syndesmosis Ankle stress view fluoroscopy  SURGEON: Melony Overly, MD  ASSISTANT: None  ANESTHESIA: MAC with peripheral nerve block  FINDINGS: Displaced and comminuted fibula fracture with displaced and comminuted medial malleolus fracture.  Impaction fracture the posterior malleolus with comminution. Syndesmotic disruption  IMPLANTS: Arthrex one third tubular plate with locking and nonlocking screws Arthrex suture cerclage  INDICATIONS:70 y.o. male sustained an ankle fracture after a fall.  He had displacement and comminution.  He was indicated for surgery.   Patient understood the risks, benefits and alternatives to surgery which include but are not limited to wound healing complications, infection, nonunion, malunion, need for further surgery as well as damage to surrounding structures. They also understood the potential for continued pain in that there were no guarantees of acceptable outcome After weighing these risks the patient opted to proceed with surgery.  PROCEDURE: Patient was identified in the preoperative holding area.  The left ankle was marked by myself.  Consent was signed by myself and the patient.  Block was performed by anesthesia in the preoperative holding area.  Patient was taken to the operative suite and placed supine on the operative table.  MAC anesthesia was induced without difficulty. Bump was placed under the operative hip and bone foam was used.  All bony prominences were well padded.  Tourniquet was placed on the operative thigh.  Preoperative antibiotics were given. The extremity was prepped and draped in the usual sterile fashion and surgical timeout  was performed.  The limb was elevated and the tourniquet was inflated to 250 mmHg.  We began by making a longitudinal incision overlying the distal fibula.  This was taken sharply down through skin and subcutaneous tissue.  Blunt dissection was used to identify any branch of the superficial peroneal nerve which was not identified.  The incision was then taken sharply down to bone and the fracture site was identified.  The fracture site was mobilized.   The fracture site were cleaned with a rondure and curette of any fracture hematoma and callus formation.  Then the fracture of the fibula was comminuted and a ladder was a large intercalary butterfly fragment.  The more prominent fragments were reduced under direct visualization and held provisionally with a lobster claw.  Then fluoroscopy confirmed adequate reduction of the ankle mortise at that time.  Then a combination of locking and nonlocking screws were used after placement of a one third tubular plate.  This provided good stability of the distal fibula fracture.    We then turned our attention to the medial malleolus.  There was continued displacement of the medial malleolus and therefore an incision was made overlying this.  This was taken sharply down through skin and subcutaneous tissue.  Bovie cautery was used for skin bleeders.  Then sharp dissection down to the fracture and the fracture site was identified.  The soft tissue flap was carried anteriorly to identify the medial gutter of the ankle joint.  Then the fracture site was mobilized and using a curette and rondure the fracture was cleared out of hematoma and fracture callus.  Then the fracture was reduced and held provisionally with a pointed reduction forcep.  This was done under direct visualization.  Then fluoroscopy confirmed adequate reduction.  2 4.0 mm partially-threaded cannulated screws  were placed across the fracture site with good fixation.    Then under fluoroscopy the ankle was  stressed and found to be unstable with regard to the syndesmosis.  Then the syndesmosis reduced under direct visualization after a separate deep incision was created about the anterior ankle area.  Then a Weber clamp was placed to provisionally fix the syndesmosis.  Then a fully threaded 3.5 millimeter screw was placed across the syndesmosis and Quadra cortical fashion.  After removal of the Weber clamp the ankle was restressed and found to be stable with regard to syndesmosis or medial clear space widening.  Then the butterfly fragment on the fibula was addressed.  The butterfly fragment was reduced under direct visitation and a Arthrex cerclage suture tape was placed about the butterfly fragment with good compression and stability.  Final fluoroscopic images were obtained.  Images were acceptable.  The wounds were irrigated with normal saline.  Wounds were closed in a layered fashion using 3-0 Monocryl and 3-0 nylon suture.  Soft dressing was placed.  Tourniquet was released.  Patient was placed in a nonweightbearing short leg splint.  Patient tolerated the procedure well.  There were no complications.  Patient was awakened from anesthesia and taken recovery in stable condition.  POST OPERATIVE INSTRUCTIONS: Nonweightbearing on operative extremity Keep splint dry and limb elevated Continue home regimen blood thinner for DVT prophylaxis Call the office with concerns Follow-up in 2 weeks for splint removal, x-rays of the operative ankle, nonweightbearing and suture removal if appropriate.  He will be placed into a short leg nonweightbearing cast.   TOURNIQUET TIME: 1 hour and 8 minutes  BLOOD LOSS:  Minimal         DRAINS: none         SPECIMEN: none       COMPLICATIONS:  * No complications entered in OR log *         Disposition: PACU - hemodynamically stable.         Condition: stable

## 2021-01-08 ENCOUNTER — Encounter (HOSPITAL_COMMUNITY): Payer: Self-pay | Admitting: Orthopaedic Surgery

## 2021-02-09 ENCOUNTER — Encounter: Payer: Medicare (Managed Care) | Admitting: Internal Medicine

## 2022-03-14 IMAGING — CT CT ANKLE*L* W/O CM
2 of 7 series · 16 of 34 positions shown, 19 images · non-contrast
Comparison: Radiographs, same date.

CLINICAL DATA: Evaluate ankle fractures.

EXAM:
CT OF THE LEFT ANKLE WITHOUT CONTRAST
TECHNIQUE: Multidetector CT imaging of the left ankle was performed according
to the standard protocol. Multiplanar CT image reconstructions were
also generated.

[Series 4: axial st · axial · 0.37mm/px · z∈[-1365,-1235]mm · 11 of 77 slices shown, 14 images]
[im 6/77  soft-tissue]
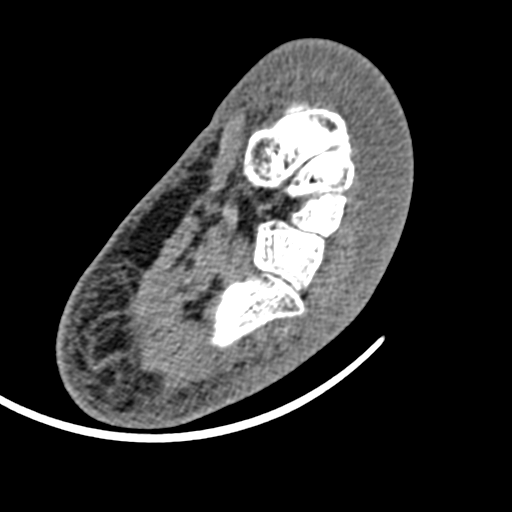
[im 6/77  bone]
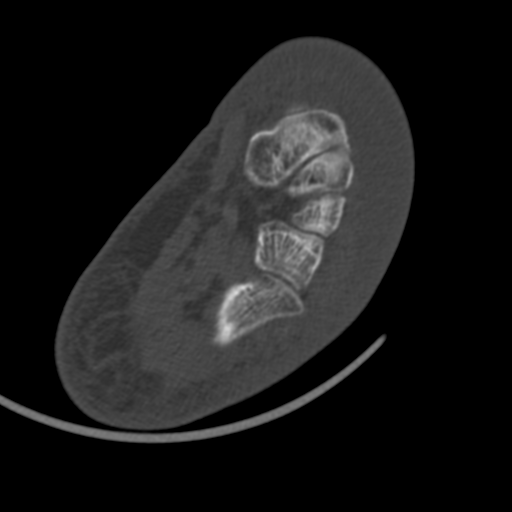
[im 12/77  bone]
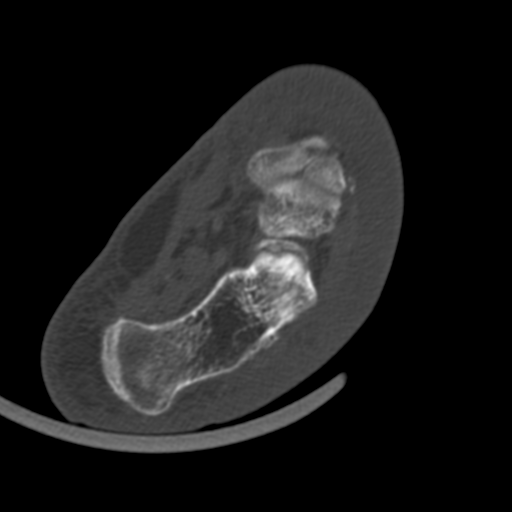
[im 18/77  bone]
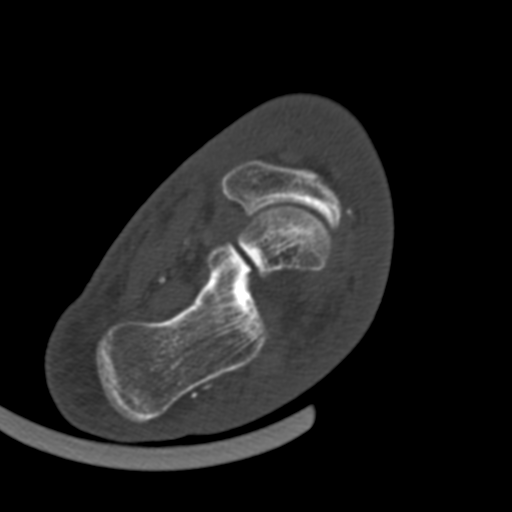
[im 24/77  bone]
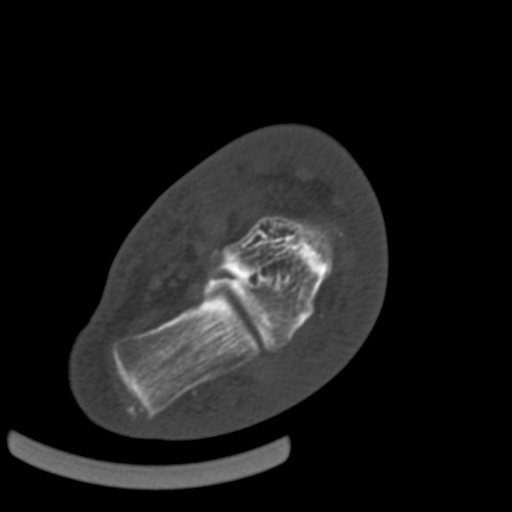
[im 30/77  soft-tissue]
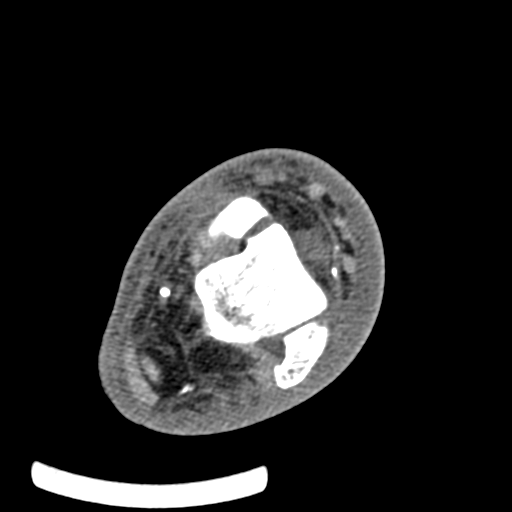
[im 30/77  bone]
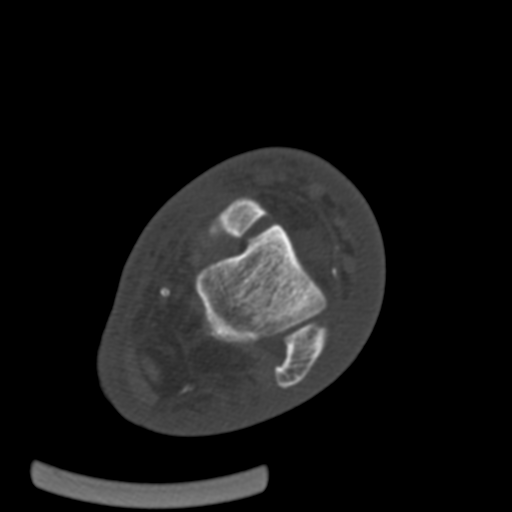
[im 41/77  bone]
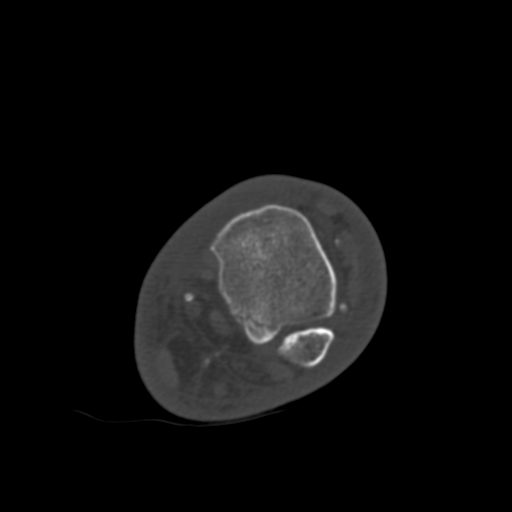
[im 47/77  bone]
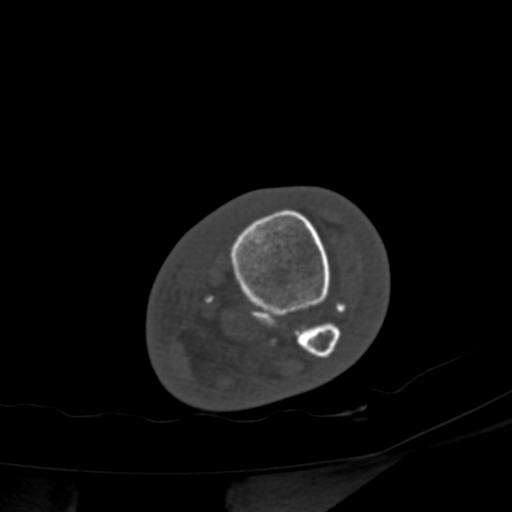
[im 53/77  bone]
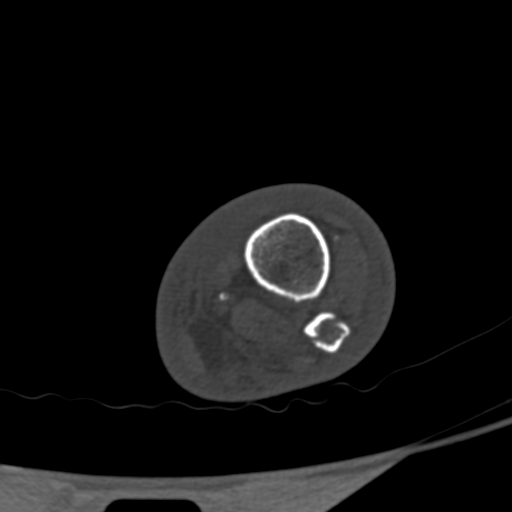
[im 59/77  soft-tissue]
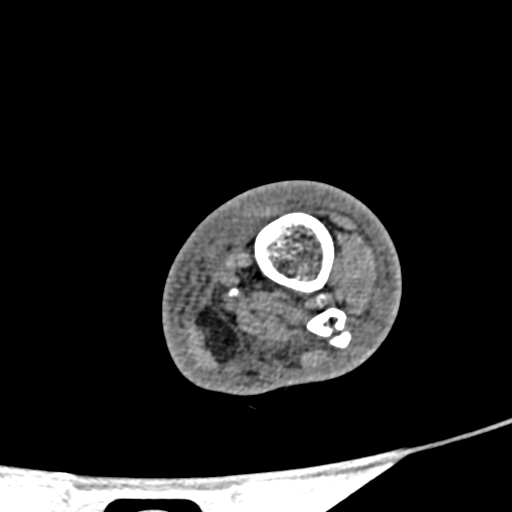
[im 59/77  bone]
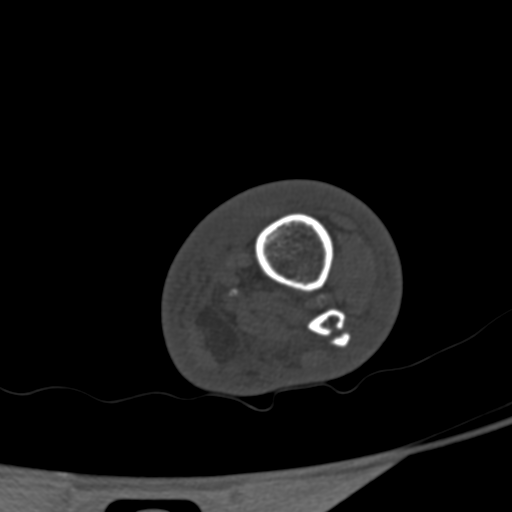
[im 65/77  bone]
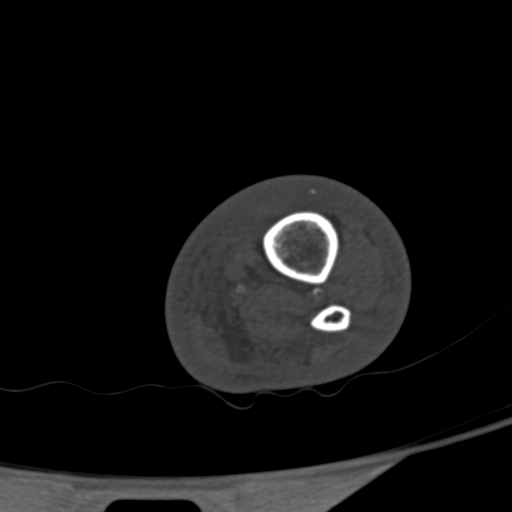
[im 71/77  bone]
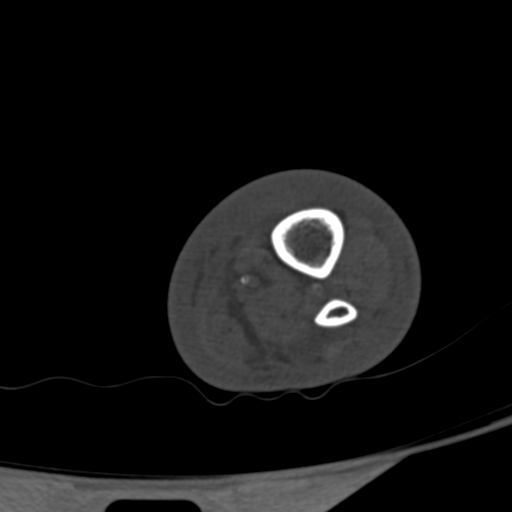

[Series 11: coronal bone · sagittal · 0.36mm/px · 5 of 122 slices shown]
[im 25/122  bone]
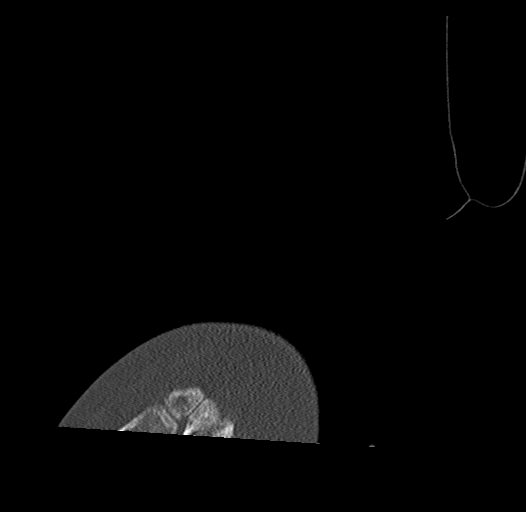
[im 49/122  bone]
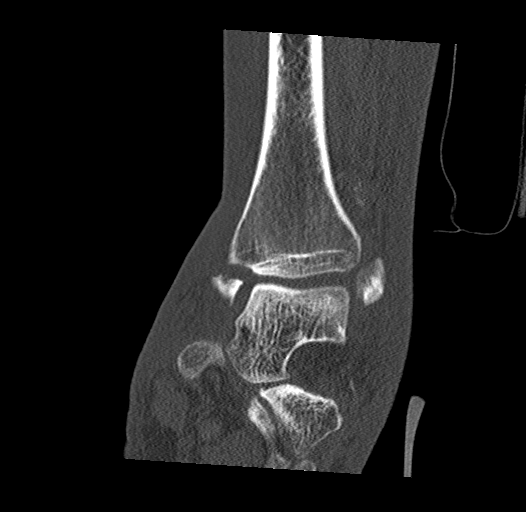
[im 50/122  soft-tissue]
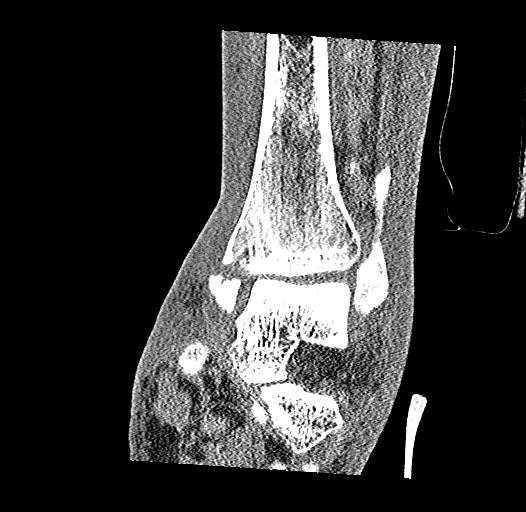
[im 73/122  bone]
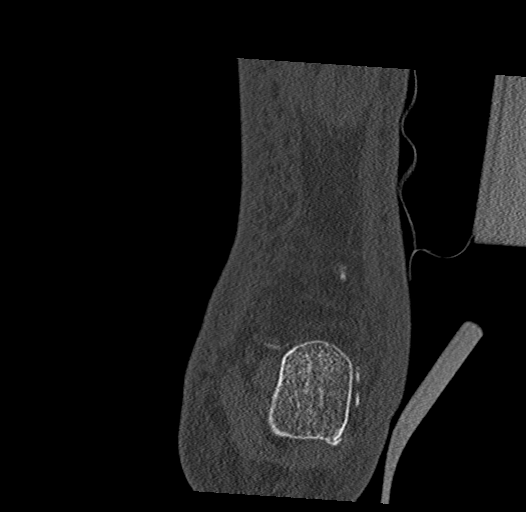
[im 97/122  bone]
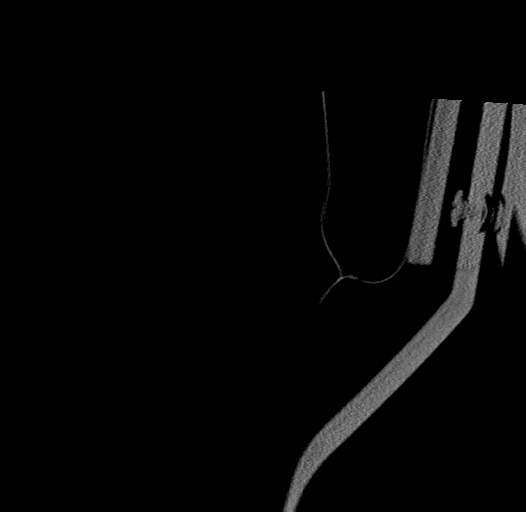

[16 of 34 positions shown; findings below may reference images not displayed]

FINDINGS: Trimalleolar ankle fractures.

Minimally displaced transverse fracture through the base of the
medial malleolus with maximum separation of 5 mm.

Nondisplaced intra-articular fracture involving the posterior
malleolus of the tibia. Small bony fragments are noted in the
posterior joint space.

Relatively nondisplaced oblique coursing fracture of the distal
fibula above the level of the ankle mortise.

The ankle mortise is maintained. No talus fracture. The subtalar
joints are maintained.

Associated subcutaneous soft tissue swelling/edema/fluid/hemorrhage
related to the fractures.

Significant vascular calcifications possibly due to diabetes.
IMPRESSION: Trimalleolar ankle fractures as described above.

## 2022-03-30 IMAGING — RF DG ANKLE COMPLETE 3+V*L*
1 series · 5 of 5 positions shown · non-contrast
Comparison: 12/19/2020

CLINICAL DATA: ORIF for ankle fracture.

EXAM:
LEFT ANKLE COMPLETE - 3+ VIEW

[Series 1: run · 5 of 5 slices shown]
[im 1/5]
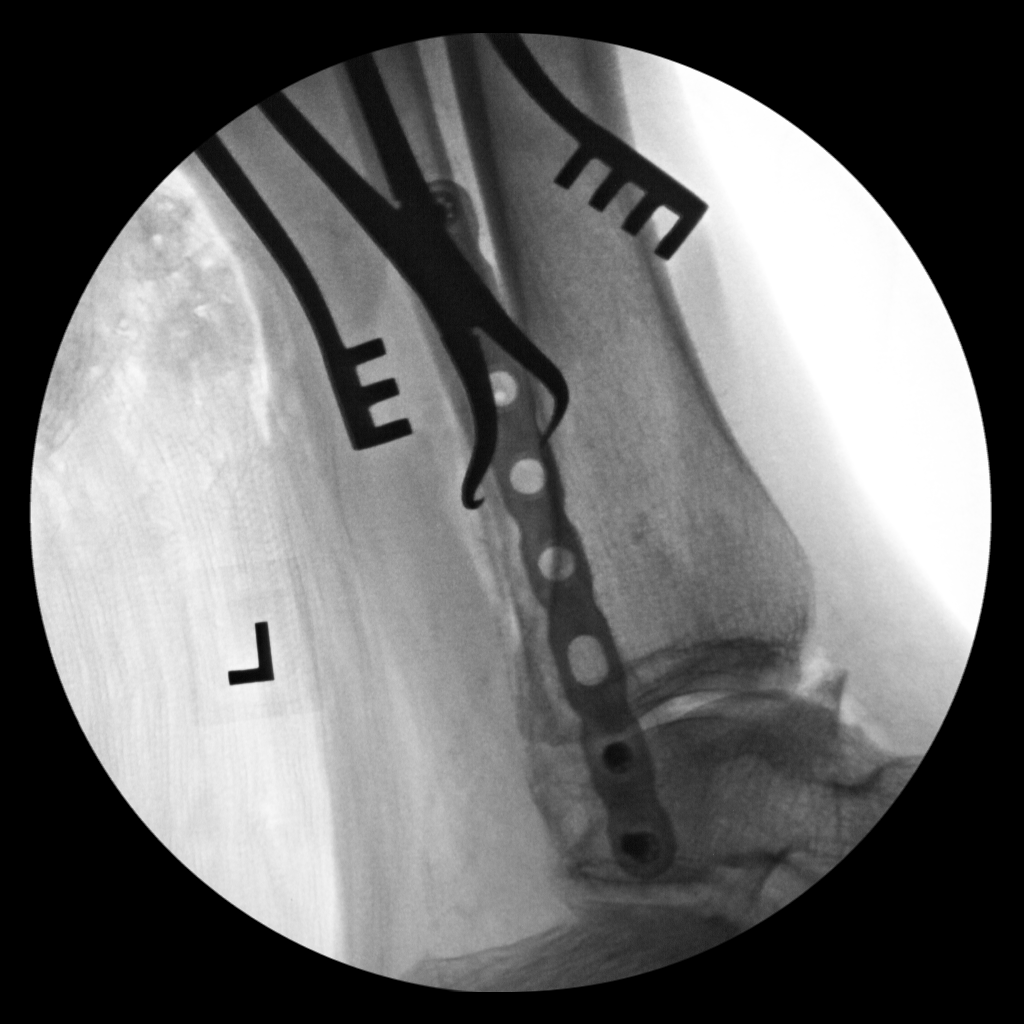
[im 2/5]
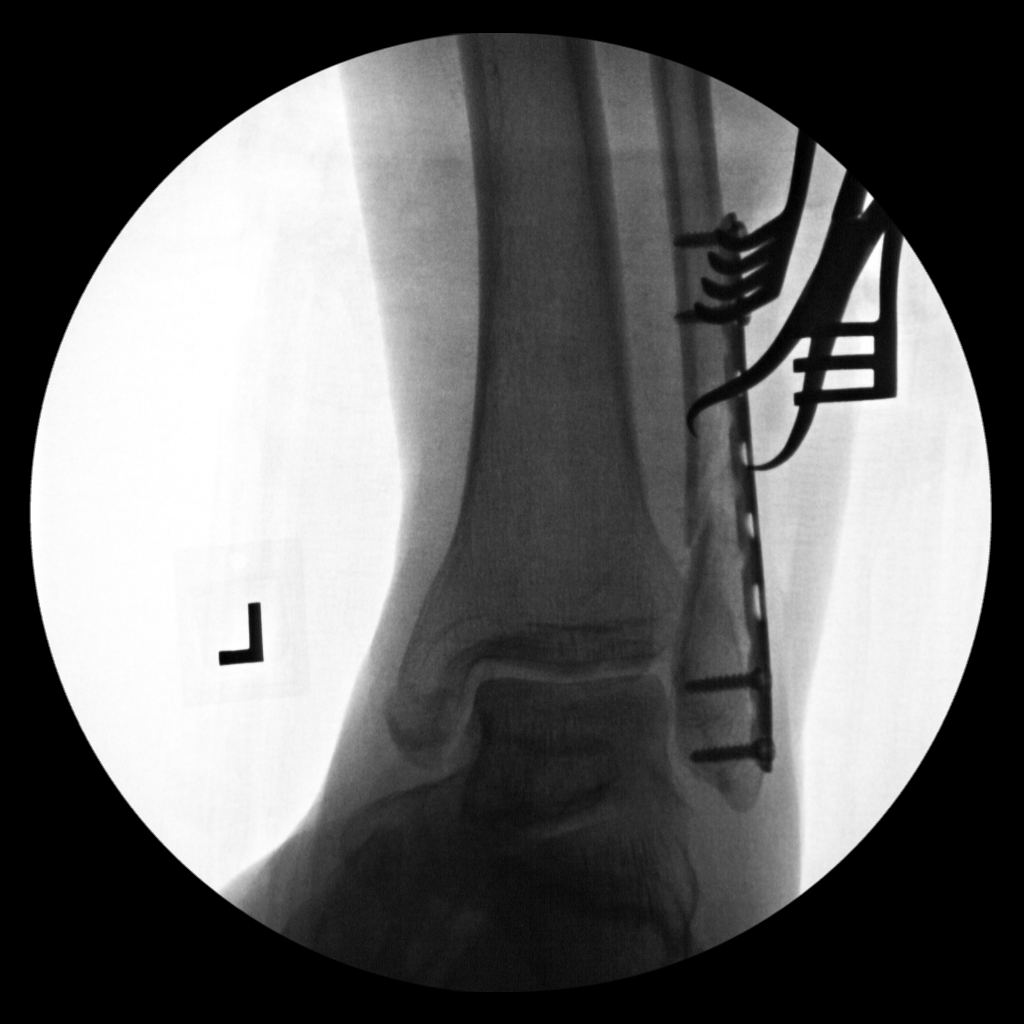
[im 3/5]
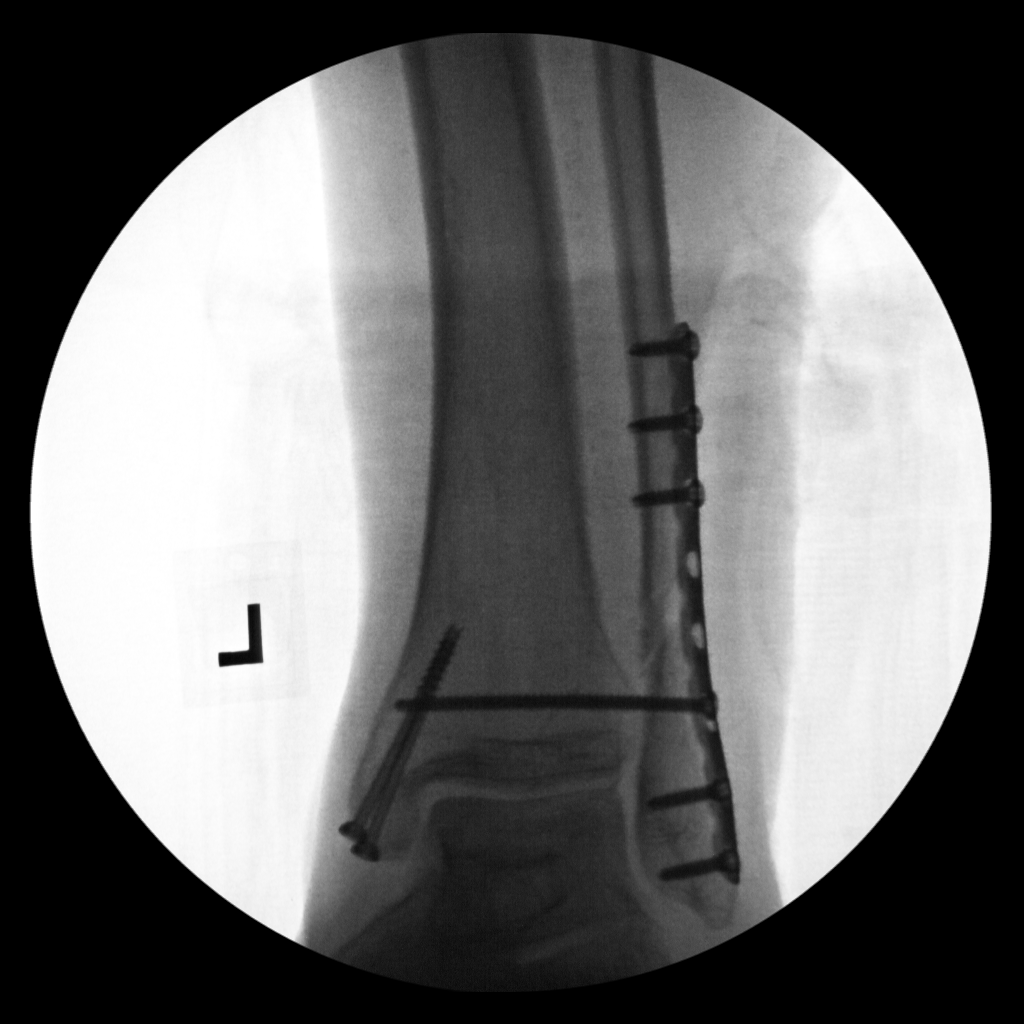
[im 4/5]
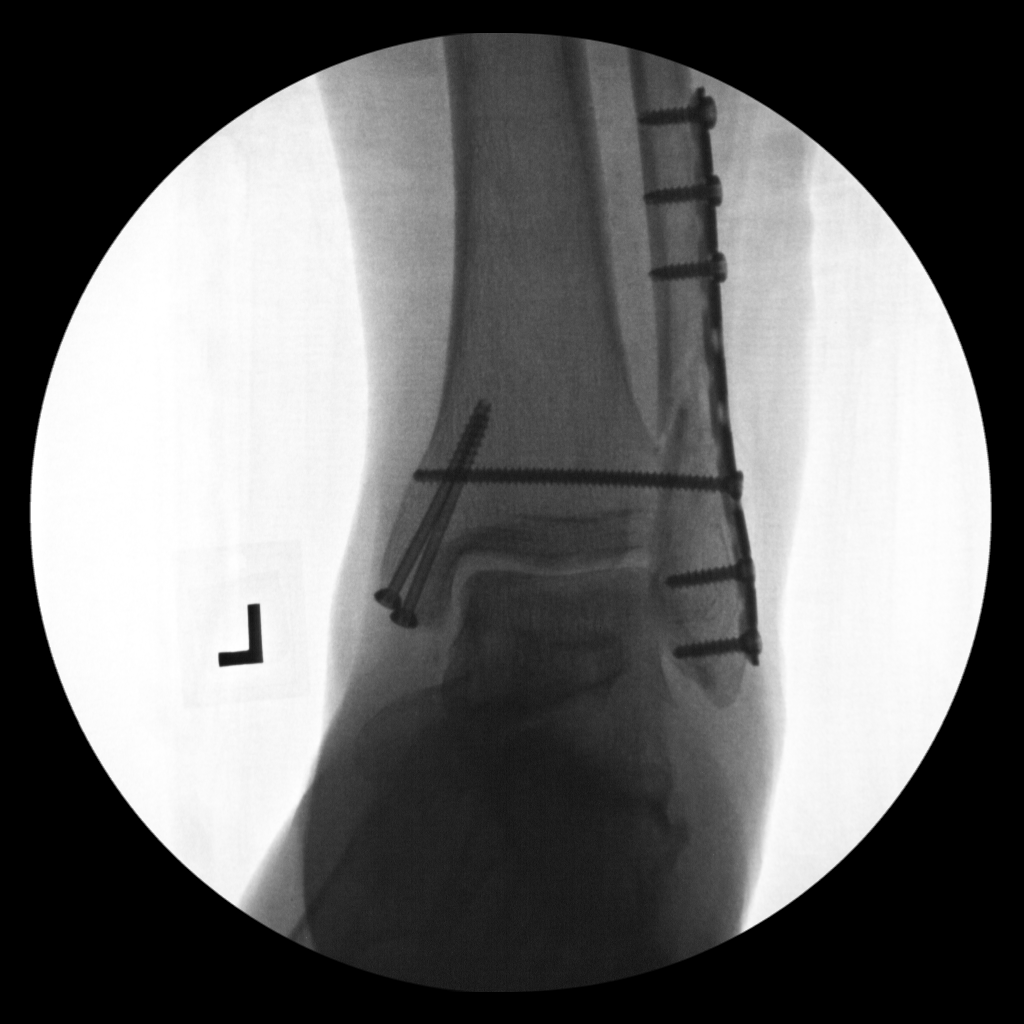
[im 5/5]
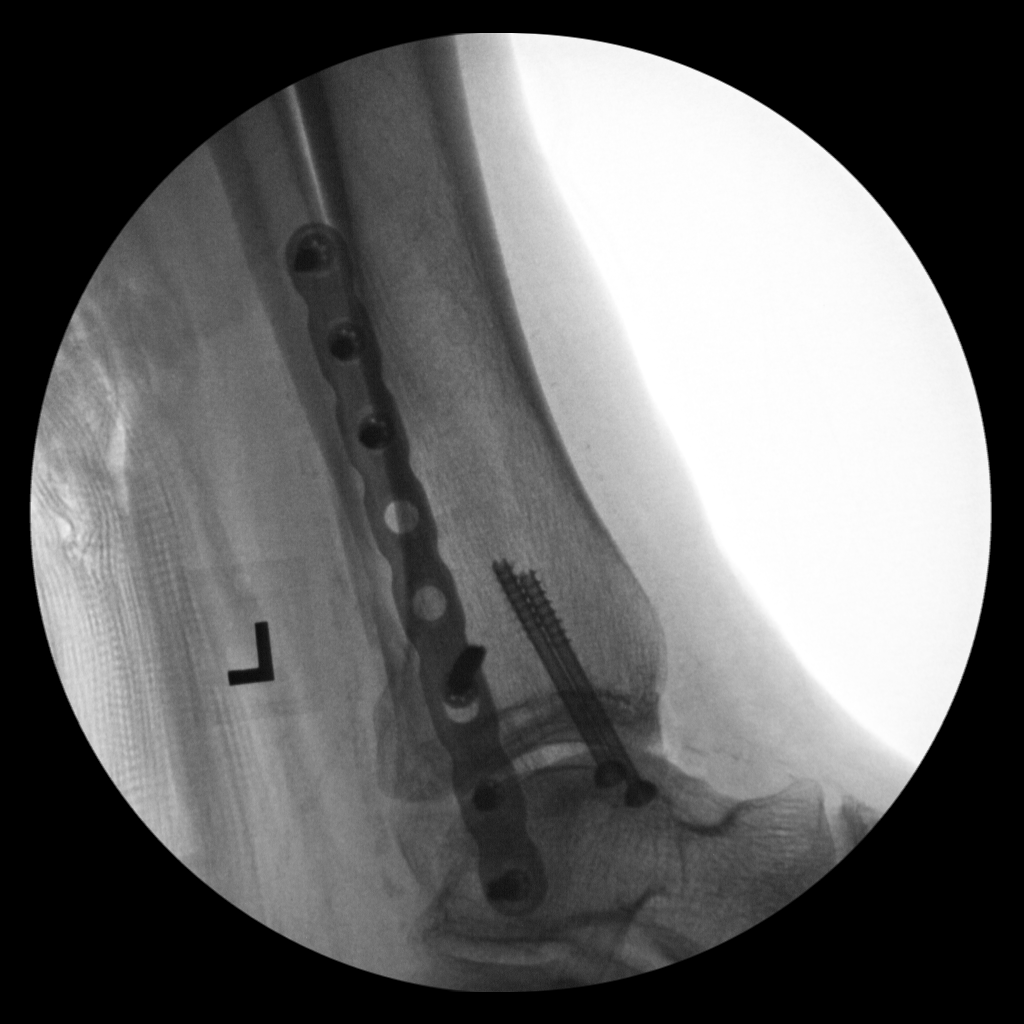

[5 of 5 positions shown; findings below may reference images not displayed]

FINDINGS: 5 intraoperative spot fluoro films obtained during ORIF for
trimalleolar ankle fracture. Marked improvement in bony alignment.
No evidence for immediate hardware complication.
IMPRESSION: Intraoperative evaluation during ORIF for trimalleolar ankle
fracture.

## 2023-05-04 ENCOUNTER — Inpatient Hospital Stay (HOSPITAL_COMMUNITY)
Admission: EM | Admit: 2023-05-04 | Discharge: 2023-05-07 | DRG: 683 | Disposition: A | Discharge status: Skilled Nursing Facility | Payer: Medicare (Managed Care) | Source: Skilled Nursing Facility | Attending: Internal Medicine | Admitting: Internal Medicine

## 2023-05-04 ENCOUNTER — Emergency Department (HOSPITAL_COMMUNITY): Payer: Medicare (Managed Care)

## 2023-05-04 ENCOUNTER — Other Ambulatory Visit: Payer: Self-pay

## 2023-05-04 ENCOUNTER — Encounter (HOSPITAL_COMMUNITY): Payer: Self-pay

## 2023-05-04 DIAGNOSIS — S098XXA Other specified injuries of head, initial encounter: Secondary | ICD-10-CM | POA: Diagnosis present

## 2023-05-04 DIAGNOSIS — N136 Pyonephrosis: Secondary | ICD-10-CM | POA: Diagnosis present

## 2023-05-04 DIAGNOSIS — N179 Acute kidney failure, unspecified: Principal | ICD-10-CM

## 2023-05-04 DIAGNOSIS — S0990XA Unspecified injury of head, initial encounter: Principal | ICD-10-CM

## 2023-05-04 DIAGNOSIS — Z888 Allergy status to other drugs, medicaments and biological substances status: Secondary | ICD-10-CM

## 2023-05-04 DIAGNOSIS — R310 Gross hematuria: Secondary | ICD-10-CM

## 2023-05-04 DIAGNOSIS — Y92122 Bedroom in nursing home as the place of occurrence of the external cause: Secondary | ICD-10-CM

## 2023-05-04 DIAGNOSIS — N3289 Other specified disorders of bladder: Secondary | ICD-10-CM | POA: Diagnosis present

## 2023-05-04 DIAGNOSIS — F0390 Unspecified dementia without behavioral disturbance: Secondary | ICD-10-CM | POA: Diagnosis present

## 2023-05-04 DIAGNOSIS — N471 Phimosis: Secondary | ICD-10-CM | POA: Diagnosis present

## 2023-05-04 DIAGNOSIS — W19XXXA Unspecified fall, initial encounter: Secondary | ICD-10-CM

## 2023-05-04 DIAGNOSIS — I10 Essential (primary) hypertension: Secondary | ICD-10-CM | POA: Diagnosis present

## 2023-05-04 DIAGNOSIS — E785 Hyperlipidemia, unspecified: Secondary | ICD-10-CM | POA: Diagnosis present

## 2023-05-04 DIAGNOSIS — R5381 Other malaise: Secondary | ICD-10-CM | POA: Diagnosis present

## 2023-05-04 DIAGNOSIS — Z7982 Long term (current) use of aspirin: Secondary | ICD-10-CM

## 2023-05-04 DIAGNOSIS — N400 Enlarged prostate without lower urinary tract symptoms: Secondary | ICD-10-CM | POA: Diagnosis present

## 2023-05-04 DIAGNOSIS — W1830XA Fall on same level, unspecified, initial encounter: Secondary | ICD-10-CM | POA: Diagnosis present

## 2023-05-04 DIAGNOSIS — N139 Obstructive and reflux uropathy, unspecified: Secondary | ICD-10-CM | POA: Diagnosis present

## 2023-05-04 DIAGNOSIS — I69354 Hemiplegia and hemiparesis following cerebral infarction affecting left non-dominant side: Secondary | ICD-10-CM

## 2023-05-04 DIAGNOSIS — Z87891 Personal history of nicotine dependence: Secondary | ICD-10-CM

## 2023-05-04 DIAGNOSIS — Z79899 Other long term (current) drug therapy: Secondary | ICD-10-CM

## 2023-05-04 DIAGNOSIS — R338 Other retention of urine: Secondary | ICD-10-CM | POA: Diagnosis present

## 2023-05-04 LAB — URINALYSIS, ROUTINE W REFLEX MICROSCOPIC

## 2023-05-04 LAB — COMPREHENSIVE METABOLIC PANEL
ALT: 32 U/L (ref 0–44)
AST: 37 U/L (ref 15–41)
Albumin: 3.5 g/dL (ref 3.5–5.0)
Alkaline Phosphatase: 50 U/L (ref 38–126)
Anion gap: 12 (ref 5–15)
BUN: 64 mg/dL — ABNORMAL HIGH (ref 8–23)
CO2: 22 mmol/L (ref 22–32)
Calcium: 9 mg/dL (ref 8.9–10.3)
Chloride: 111 mmol/L (ref 98–111)
Creatinine, Ser: 3.88 mg/dL — ABNORMAL HIGH (ref 0.61–1.24)
GFR, Estimated: 16 mL/min — ABNORMAL LOW (ref >60)
Glucose, Bld: 105 mg/dL — ABNORMAL HIGH (ref 70–99)
Potassium: 4 mmol/L (ref 3.5–5.1)
Sodium: 145 mmol/L (ref 135–145)
Total Bilirubin: 0.8 mg/dL (ref 0.0–1.2)
Total Protein: 6.5 g/dL (ref 6.5–8.1)

## 2023-05-04 LAB — URINALYSIS, MICROSCOPIC (REFLEX)

## 2023-05-04 LAB — CBC WITH DIFFERENTIAL/PLATELET
Abs Immature Granulocytes: 0.09 10*3/uL — ABNORMAL HIGH (ref 0.00–0.07)
Basophils Absolute: 0 10*3/uL (ref 0.0–0.1)
Basophils Relative: 0 %
Eosinophils Absolute: 0 10*3/uL (ref 0.0–0.5)
Eosinophils Relative: 0 %
HCT: 34.6 % — ABNORMAL LOW (ref 39.0–52.0)
Hemoglobin: 11.1 g/dL — ABNORMAL LOW (ref 13.0–17.0)
Immature Granulocytes: 1 %
Lymphocytes Relative: 3 %
Lymphs Abs: 0.4 10*3/uL — ABNORMAL LOW (ref 0.7–4.0)
MCH: 27.4 pg (ref 26.0–34.0)
MCHC: 32.1 g/dL (ref 30.0–36.0)
MCV: 85.4 fL (ref 80.0–100.0)
Monocytes Absolute: 0.8 10*3/uL (ref 0.1–1.0)
Monocytes Relative: 7 %
Neutro Abs: 10.8 10*3/uL — ABNORMAL HIGH (ref 1.7–7.7)
Neutrophils Relative %: 89 %
Platelets: 175 10*3/uL (ref 150–400)
RBC: 4.05 MIL/uL — ABNORMAL LOW (ref 4.22–5.81)
RDW: 14.5 % (ref 11.5–15.5)
WBC: 12.1 10*3/uL — ABNORMAL HIGH (ref 4.0–10.5)
nRBC: 0 % (ref 0.0–0.2)

## 2023-05-04 MED ORDER — ONDANSETRON HCL 4 MG PO TABS: 4.0000 mg | ORAL_TABLET | Freq: Four times a day (QID) | ORAL | Status: DC | PRN

## 2023-05-04 MED ORDER — ONDANSETRON HCL 4 MG/2ML IJ SOLN: 4.0000 mg | Freq: Four times a day (QID) | INTRAMUSCULAR | Status: DC | PRN

## 2023-05-04 MED ORDER — SODIUM CHLORIDE 0.9 % IV SOLN
1.0000 g | INTRAVENOUS | Status: DC
  Administered 2023-05-05 – 2023-05-06 (×2): 1 g via INTRAVENOUS
  Filled 2023-05-04 (×2): qty 10

## 2023-05-04 MED ORDER — MORPHINE SULFATE (PF) 2 MG/ML IV SOLN
2.0000 mg | INTRAVENOUS | Status: DC | PRN
  Administered 2023-05-05 – 2023-05-06 (×3): 2 mg via INTRAVENOUS
  Filled 2023-05-04 (×3): qty 1

## 2023-05-04 MED ORDER — SODIUM CHLORIDE 0.9 % IV BOLUS
1000.0000 mL | Freq: Once | INTRAVENOUS | Status: AC
  Administered 2023-05-04: 1000 mL via INTRAVENOUS

## 2023-05-04 MED ORDER — LIDOCAINE HCL URETHRAL/MUCOSAL 2 % EX GEL
1.0000 | Freq: Once | CUTANEOUS | Status: AC
  Administered 2023-05-04: 1 via URETHRAL
  Filled 2023-05-04: qty 11

## 2023-05-04 MED ORDER — DEXTROSE IN LACTATED RINGERS 5 % IV SOLN: INTRAVENOUS | Status: DC

## 2023-05-04 MED ORDER — ENOXAPARIN SODIUM 30 MG/0.3ML IJ SOSY: 30.0000 mg | PREFILLED_SYRINGE | INTRAMUSCULAR | Status: DC

## 2023-05-04 MED ORDER — SODIUM CHLORIDE 0.9 % IV SOLN
2.0000 g | Freq: Once | INTRAVENOUS | Status: AC
  Administered 2023-05-04: 2 g via INTRAVENOUS
  Filled 2023-05-04: qty 20

## 2023-05-04 NOTE — ED Provider Notes (Signed)
  EMERGENCY DEPARTMENT AT [facility] Provider Note   CSN: [id number] Arrival date & time: 05/04/23  1110     History  Chief Complaint  Patient presents with   Fall   Head Injury    Kurt Walton is a 73 y.o. male.  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  Patient is a resident of [facility] facility where he suffered a mechanical fall while trying to get up into bed from his wheelchair.  He fell forward and struck his forehead.  Small laceration over forehead.  No LOC.  Denies neck pain chest pain shortness of breath abdominal pain pain in other extremities.  Takes daily aspirin  no other anticoagulation   Fall  Head Injury      Home Medications Prior to Admission medications   Medication Sig Start Date End Date Taking? Authorizing Provider  pravastatin  (PRAVACHOL ) 80 MG tablet Take 80 mg by mouth at bedtime.   Yes [provider]  senna (SENOKOT) 8.6 MG TABS tablet Take 1 tablet by mouth at bedtime.   Yes [provider]  tamsulosin  (FLOMAX ) 0.4 MG CAPS capsule Take 0.4 mg by mouth daily.   Yes [provider]  traZODone  (DESYREL ) 50 MG tablet Take 25 mg by mouth at bedtime.   Yes [provider]  aspirin  EC 81 MG tablet Take 81 mg by mouth in the morning. Swallow whole.   Yes [provider]  Camphor-Menthol-Methyl Sal (SALONPAS) 3.03-30-08 % PTCH Place 1 patch onto the skin every 8 (eight) hours as needed (pain).    [provider]  chlorthalidone (HYGROTON) 25 MG tablet Take 25 mg by mouth in the morning.    [provider]  diclofenac Sodium (VOLTAREN) 1 % GEL Apply 4 g topically 4 (four) times daily as needed (pain).    [provider]  gabapentin (NEURONTIN) 600 MG tablet Take 600 mg by mouth at bedtime.    [provider]  HYDROcodone -acetaminophen  (NORCO/VICODIN) 5-325 MG tablet  Take 1 tablet by mouth every 8 (eight) hours as needed (pain.).    [provider]  lisinopril (ZESTRIL) 20 MG tablet Take 20 mg by mouth in the morning.    [provider]  magnesium hydroxide (MILK OF MAGNESIA) 400 MG/5ML suspension Take 15 mLs by mouth at bedtime as needed (constipation).    [provider]  magnesium hydroxide (MILK OF MAGNESIA) 400 MG/5ML suspension Take 30 mLs by mouth daily as needed for mild constipation.    [provider]  Menthol, Topical Analgesic, (BIOFREEZE) 4 % GEL Apply 1 application topically in the morning, at noon, in the evening, and at bedtime.    [provider]  Sodium Phosphates (RA SALINE ENEMA) 19-7 GM/118ML ENEM Place 1 Dose rectally daily as needed (constipation).    [provider]  Vitamin D, Ergocalciferol, (DRISDOL) 1.25 MG (50000 UNIT) CAPS capsule Take 50,000 Units by mouth See admin instructions. Take 50,000 units by mouth on the 7th of every month   Yes [provider]      Allergies    Tuberculin purified protein derivative and Tuberculin tests    Review of Systems   Review of Systems  Physical Exam Updated Vital Signs BP 124/74 (BP Location: Left Arm)   Pulse 88   Temp 98.3 F (36.8 C)   Resp 18   SpO2 100%  Physical Exam Vitals and nursing  note reviewed.  HENT:     Head:     Comments: Frontal hematoma over forehead with less than 0.25 centimeter punctate wound without active bleeding Eyes:     Pupils: Pupils are equal, round, and reactive to light.  Cardiovascular:     Rate and Rhythm: Normal rate and regular rhythm.  Pulmonary:     Effort: Pulmonary effort is normal.     Breath sounds: Normal breath sounds.  Abdominal:     Palpations: Abdomen is soft.     Tenderness: There is no abdominal tenderness.  Musculoskeletal:     Cervical back: Neck supple. No tenderness.  Skin:    General: Skin is warm and dry.  Neurological:     Mental Status: He is alert.      Comments: Diminished strength in left upper extremity and left lower extremity, baseline per patient Good motor strength right upper and right lower extremity No external evidence of trauma in extremities No midline tenderness step-off deformity of back  Psychiatric:        Mood and Affect: Mood normal.     ED Results / Procedures / Treatments   Labs (all labs ordered are listed, but only abnormal results are displayed) Labs Reviewed - No data to display  EKG None  Radiology CT Head Wo Contrast Result Date: 05/04/2023 CLINICAL DATA:  Head and neck trauma EXAM: CT HEAD WITHOUT CONTRAST CT CERVICAL SPINE WITHOUT CONTRAST TECHNIQUE: Multidetector CT imaging of the head and cervical spine was performed following the standard protocol without intravenous contrast. Multiplanar CT image reconstructions of the cervical spine were also generated. RADIATION DOSE REDUCTION: This exam was performed according to the departmental dose-optimization program which includes automated exposure control, adjustment of the mA and/or kV according to patient size and/or use of iterative reconstruction technique. COMPARISON:  01/13/2017 FINDINGS: CT HEAD FINDINGS Brain: No evidence of acute infarction, hemorrhage, extra-axial collection or mass lesion/mass effect. Unchanged caliber and configuration of the ventricles, with right parietal and right occipital approach intraventricular shunt catheters. Periventricular white matter as well as encephalomalacia of the cerebellum and marked prominence of the fourth ventricle. Vascular: No hyperdense vessel or unexpected calcification. Skull: Normal. Negative for fracture or focal lesion. Sinuses/Orbits: No acute finding. Other: None. CT CERVICAL SPINE FINDINGS Alignment: Degenerative straightening and reversal of the normal cervical lordosis. Skull base and vertebrae: No acute fracture. No primary bone lesion or focal pathologic process. Soft tissues and spinal canal: No  prevertebral fluid or swelling. No visible canal hematoma. Disc levels: Severe disc degenerative disease of the mid to lower cervical spine. Upper chest: Negative. Other: None. IMPRESSION: 1. No acute intracranial pathology. 2. Unchanged caliber and configuration of the ventricles, with right parietal and right occipital approach intraventricular shunt catheters. Periventricular white matter as well as encephalomalacia of the cerebellum and marked prominence of the fourth ventricle. 3. No fracture or static subluxation of the cervical spine. 4. Severe disc degenerative disease of the mid to lower cervical spine. Electronically Signed   By: Marolyn JONETTA Jaksch M.D.   On: 05/04/2023 13:43   CT Cervical Spine Wo Contrast Result Date: 05/04/2023 CLINICAL DATA:  Head and neck trauma EXAM: CT HEAD WITHOUT CONTRAST CT CERVICAL SPINE WITHOUT CONTRAST TECHNIQUE: Multidetector CT imaging of the head and cervical spine was performed following the standard protocol without intravenous contrast. Multiplanar CT image reconstructions of the cervical spine were also generated. RADIATION DOSE REDUCTION: This exam was performed according to the departmental dose-optimization program which includes automated exposure control, adjustment  of the mA and/or kV according to patient size and/or use of iterative reconstruction technique. COMPARISON:  01/13/2017 FINDINGS: CT HEAD FINDINGS Brain: No evidence of acute infarction, hemorrhage, extra-axial collection or mass lesion/mass effect. Unchanged caliber and configuration of the ventricles, with right parietal and right occipital approach intraventricular shunt catheters. Periventricular white matter as well as encephalomalacia of the cerebellum and marked prominence of the fourth ventricle. Vascular: No hyperdense vessel or unexpected calcification. Skull: Normal. Negative for fracture or focal lesion. Sinuses/Orbits: No acute finding. Other: None. CT CERVICAL SPINE FINDINGS Alignment:  Degenerative straightening and reversal of the normal cervical lordosis. Skull base and vertebrae: No acute fracture. No primary bone lesion or focal pathologic process. Soft tissues and spinal canal: No prevertebral fluid or swelling. No visible canal hematoma. Disc levels: Severe disc degenerative disease of the mid to lower cervical spine. Upper chest: Negative. Other: None. IMPRESSION: 1. No acute intracranial pathology. 2. Unchanged caliber and configuration of the ventricles, with right parietal and right occipital approach intraventricular shunt catheters. Periventricular white matter as well as encephalomalacia of the cerebellum and marked prominence of the fourth ventricle. 3. No fracture or static subluxation of the cervical spine. 4. Severe disc degenerative disease of the mid to lower cervical spine. Electronically Signed   By: Marolyn JONETTA Jaksch M.D.   On: 05/04/2023 13:43   DG Chest 2 View Result Date: 05/04/2023 CLINICAL DATA:  Patient slipped and fell while getting into bed. EXAM: CHEST - 2 VIEW COMPARISON:  None Available. FINDINGS: The lungs are clear without focal pneumonia, edema, pneumothorax or pleural effusion. The cardiopericardial silhouette is within normal limits for size. VP shunt tubing overlies the medial right chest. No acute bony abnormality. IMPRESSION: No active cardiopulmonary disease. Electronically Signed   By: Camellia Candle M.D.   On: 05/04/2023 13:24    Procedures Procedures    Medications Ordered in ED Medications - No data to display  ED Course/ Medical Decision Making/ A&P                                 Medical Decision Making 73 year old male with history as above presenting after mechanical fall in nursing facility.  No anticoagulation.  Obvious frontal hematoma but no laceration that would require suturing.  repair with skin glue.  No other external evidence of trauma.  CT head C-spine chest x-ray showed no acute traumatic findings.  Patient has remained  stable and is appropriate for discharge back to skilled nurse facility  Amount and/or Complexity of Data Reviewed Radiology: ordered.           Final Clinical Impression(s) / ED Diagnoses Final diagnoses:  Injury of head, initial encounter  Fall, initial encounter    Rx / DC Orders ED Discharge Orders     None         Pamella Ozell LABOR, DO 05/04/23 1554

## 2023-05-04 NOTE — ED Notes (Signed)
 Urology at bedside to insert foley due to unsuccessful attempt earlier in evening.

## 2023-05-04 NOTE — H&P (Signed)
 History and Physical    Patient: Kurt Walton FMW:[id number] DOB: 07-Oct-1950 DOA: 05/04/2023 DOS: the patient was seen and examined on 05/04/2023 PCP: Alona Lamar SAILOR, MD  Patient coming from: SNF  Chief Complaint:  Chief Complaint  Patient presents with   Fall   Head Injury   HPI: Kurt Walton is a 73 y.o. male with medical history significant of dementia, essential hypertension, previous history of alcohol abuse and polysubstance abuse, history of CVA with residual hemiparesis, who was brought in from [facility] from rehab after having a mechanical fall.  He was apparently tried to get up into bed from his wheelchair when he fell forward and struck his forehead.  Patient sustained some laceration.  There was no reported loss of consciousness.  No other complaints.  He was brought into the ER where he was more confused.  Patient was noted to have blood in his diaper.  Subsequently observation showed massive bladder distention.  Also severe phimosis with obstructive uropathy and bilateral hydronephrosis.  Foley insertion was difficult in the ER however urology consulted and were able to insert Foley catheter with more than a liter of urine which is completely bloody removed from the bladder.  Patient has been admitted to the medical service where urology will follow.  Patient appears to have evidence of UTI as well.  IV antibiotics started.  Review of Systems: As mentioned in the history of present illness. All other systems reviewed and are negative. 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   Hard of hearing    Hx of adenomatous colonic polyps 01/29/2016   Hypertension    Substance abuse (HCC)    ETOH, past   Urinary incontinence    Past Surgical History:  Procedure Laterality Date   HIP SURGERY     ORIF ANKLE FRACTURE Left 01/04/2021   Procedure: OPEN REDUCTION INTERNAL FIXATION (ORIF) ANKLE FRACTURE;  Surgeon: Elsa Lonni SAUNDERS, MD;  Location: [facility] OR;  Service: Orthopedics;  Laterality: Left;  LENGHT OF SURGERY: 90 MINUTES   TUMOR REMOVAL     not tumor, but aneurysm   VENTRICULOPERITONEAL SHUNT  10/17/2011   Procedure: SHUNT INSERTION VENTRICULAR-PERITONEAL;  Surgeon: Reyes JONETTA Budge, MD;  Location: [facility];  Service: Neurosurgery;  Laterality: Right;  Instertion of Ventricular-Peritoneal Shunt   VENTRICULOPERITONEAL SHUNT     once at age 79 and replaced 2013.   Social History:  reports that he quit smoking about 11 years ago. He has never used smokeless tobacco. He reports that he does not drink alcohol and does not use drugs.  Allergies  Allergen Reactions   Tuberculin Purified Protein Derivative Other (See Comments)    Allergic, per [facility]   Tuberculin Tests Other (See Comments)    Allergic, per [facility]    Family History  Problem Relation Age of Onset   Colon cancer Neg Hx     Prior to Admission medications   Medication Sig Start Date End Date Taking? Authorizing Provider  aspirin  EC 81 MG tablet Take 81 mg by mouth in the morning. Swallow whole.   Yes [provider]  pravastatin  (PRAVACHOL ) 80 MG tablet Take 80 mg by mouth at bedtime.   Yes [provider]  senna (SENOKOT) 8.6 MG TABS tablet Take 1 tablet by mouth at bedtime.   Yes [provider]  tamsulosin  (FLOMAX ) 0.4 MG CAPS capsule Take 0.4 mg by mouth daily.  Yes [provider]  traZODone  (DESYREL ) 50 MG tablet Take 25 mg by mouth at bedtime.   Yes [provider]  Vitamin D, Ergocalciferol, (DRISDOL) 1.25 MG (50000 UNIT) CAPS capsule Take 50,000 Units by mouth See admin instructions. Take 50,000 units by mouth on the 7th of every month   Yes [provider]    Physical Exam: Vitals:   05/04/23 1124 05/04/23 1637  BP: 124/74 99/61  Pulse: 88 96  Resp: 18 17  Temp: 98.3 F (36.8 C) 99.7 F (37.6 C)  TempSrc:  Axillary  SpO2: 100% 98%   Constitutional: Confused, chronically ill  looking, bedridden, calm, comfortable Eyes: PERRL, lids and conjunctivae normal ENMT: Mucous membranes are moist. Posterior pharynx clear of any exudate or lesions.Normal dentition.  Neck: normal, supple, no masses, no thyromegaly Respiratory: clear to auscultation bilaterally, no wheezing, no crackles. Normal respiratory effort. No accessory muscle use.  Cardiovascular: Regular rate and rhythm, no murmurs / rubs / gallops. No extremity edema. 2+ pedal pulses. No carotid bruits.  Abdomen: no tenderness, no masses palpated. No hepatosplenomegaly. Bowel sounds positive.  Musculoskeletal: Good range of motion, no joint swelling or tenderness, Skin: no rashes, lesions, ulcers. No induration Neurologic: CN 2-12 grossly intact. Sensation intact, DTR normal. Strength 5/5 in all 4.  Psychiatric: Confused, not talking much, no distress  Data Reviewed:  Vital signs stable glucose 105, BUN 64 creatinine 3.88.  White count 12.1 hemoglobin 11.1.  Urinalysis showed turbid urine 3 urine culture has been sent out, CT abdomen and pelvis showed no acute acute abdominal or intrapelvic trauma chronic bladder outlet obstruction due to enlarged prostate with diffuse bladder wall thickening and trabeculations, bilateral hydroureteronephrosis likely due to bladder distention and colonic diverticulosis.  Assessment and Plan:  #1 obstructive uropathy: Patient has history of BPH has been on therapy.  Currently Foley catheter has been inserted.  Urine is bloody.  We will monitor renal function.  Follow urology follow-up  #2 gross hematuria: Cause is unclear.  Continue with Foley catheter, IV antibiotics and urology follow-up and follow recommendations.  Follow H&H serially.  #3 AKI: Secondary to obstructive uropathy.  Follow renal function after the Foley insertion and obstruction uropathy relief.  #4 dementia: At baseline.  Continue to monitor  #5 essential hypertension: Continue blood pressure monitoring.  #6  status post fall: Patient has some laceration.  Will get PT and OT consult.  Patient will likely be discharged back to skilled facility when cleared when stable.    Advance Care Planning:   Code Status: Not on file full code  Consults: Urology, The resident Dr. Ulis  Family Communication: No family at bedside  Severity of Illness: The appropriate patient status for this patient is INPATIENT. Inpatient status is judged to be reasonable and necessary in order to provide the required intensity of service to ensure the patient's safety. The patient's presenting symptoms, physical exam findings, and initial radiographic and laboratory data in the context of their chronic comorbidities is felt to place them at high risk for further clinical deterioration. Furthermore, it is not anticipated that the patient will be medically stable for discharge from the hospital within 2 midnights of admission.   * I certify that at the point of admission it is my clinical judgment that the patient will require inpatient hospital care spanning beyond 2 midnights from the point of admission due to high intensity of service, high risk for further deterioration and high frequency of surveillance required.*  AuthorBETHA SIM KNOLL, MD 05/04/2023  8:43 PM  For on call review [email or web address].

## 2023-05-04 NOTE — Discharge Instructions (Addendum)
 Kurt Walton was seen in the emergency department after a fall The CAT scan taken of his head neck and chest x-ray looked okay We cleaned up his scalp but he did not require stitches You should follow-up with his primary care doctor and continue taking all previous prescribed medications Return to the Emergency Department for repeated falls or any other concerns

## 2023-05-04 NOTE — ED Provider Notes (Signed)
  Physical Exam  BP 99/61 (BP Location: Left Arm)   Pulse 96   Temp 99.7 F (37.6 C) (Axillary)   Resp 17   SpO2 98%   Physical Exam  Procedures  Procedures  ED Course / MDM    Medical Decision Making Patient was supposed to be discharged.  Nurse called me around 4 PM stating that patient has hematuria.  I examined patient and patient has gross hematuria.  He has mild suprapubic tenderness. Will order CBC CMP and CT abdomen pelvis and urinalysis  7:55 PM Reviewed patient's labs and patient has acute renal failure with creatinine was 3.88.  CT abdomen pelvis showed bilateral hydronephrosis due to bladder distention.  Patient has a bladder scan of 600 cc.  I attempted to put a three-way catheter for the hematuria but unfortunately patient has not been circumcised and I was unable to retract the foreskin to find the urethral meatus.  I consulted urology, Dr. Ulis.  He request urology cart to bedside  8:22 PM Urology was able to place Foley catheter.  Patient has dark urine and he is concerned for possible infection and recommended antibiotics.  He recommend manual irrigation every 4 hours.  He states that urology will follow along and recommend medicine admission for IV fluids and recheck creatinine.  CRITICAL CARE Performed by: Alm VEAR Cave   Total critical care time: 38 minutes  Critical care time was exclusive of separately billable procedures and treating other patients.  Critical care was necessary to treat or prevent imminent or life-threatening deterioration.  Critical care was time spent personally by me on the following activities: development of treatment plan with patient and/or surrogate as well as nursing, discussions with consultants, evaluation of patient's response to treatment, examination of patient, obtaining history from patient or surrogate, ordering and performing treatments and interventions, ordering and review of laboratory studies, ordering and review of  radiographic studies, pulse oximetry and re-evaluation of patient's condition.   Problems Addressed: AKI (acute kidney injury) (HCC): acute illness or injury Fall, initial encounter: acute illness or injury Gross hematuria: acute illness or injury Injury of head, initial encounter: acute illness or injury  Amount and/or Complexity of Data Reviewed Labs: ordered. Decision-making details documented in ED Course. Radiology: ordered and independent interpretation performed. Decision-making details documented in ED Course.          Cave Alm Macho, MD 05/04/23 2025

## 2023-05-04 NOTE — Consult Note (Signed)
 Urology Consult Note   Requesting Attending Physician:  Sim Emery CROME, MD Service Providing Consult: Urology  Consulting Attending: Sherwood Belt  Reason for Consult:  urinary retention  HPI: Kurt Walton is seen in consultation for reasons noted above at the request of Sim Emery CROME, MD for evaluation of urinary retention and difficult catheter placement  This is a 73 y.o. male with past medical history significant for dementia.  He presented from an outside facility after falling, and was found to be unable to urinate.  He was also noticed to have red-tinged urine coming from his penis.  Catheter was attempted by nursing staff and ED providers, but his significant phimosis prevented passage of any size catheter.  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   Hard of hearing    Hx of adenomatous colonic polyps 01/29/2016   Hypertension    Substance abuse (HCC)    ETOH, past   Urinary incontinence     Past Surgical History:  Past Surgical History:  Procedure Laterality Date   HIP SURGERY     ORIF ANKLE FRACTURE Left 01/04/2021   Procedure: OPEN REDUCTION INTERNAL FIXATION (ORIF) ANKLE FRACTURE;  Surgeon: Elsa Lonni SAUNDERS, MD;  Location: [facility] OR;  Service: Orthopedics;  Laterality: Left;  LENGHT OF SURGERY: 90 MINUTES   TUMOR REMOVAL     not tumor, but aneurysm   VENTRICULOPERITONEAL SHUNT  10/17/2011   Procedure: SHUNT INSERTION VENTRICULAR-PERITONEAL;  Surgeon: Reyes JONETTA Budge, MD;  Location: [facility];  Service: Neurosurgery;  Laterality: Right;  Instertion of Ventricular-Peritoneal Shunt   VENTRICULOPERITONEAL SHUNT     once at age 72 and replaced 2013.    Medication: Current Facility-Administered Medications  Medication Dose Route Frequency Provider Last Rate Last Admin   0.9 %  sodium chloride  infusion  500 mL Intravenous Continuous  Avram Lupita BRAVO, MD       cefTRIAXone  (ROCEPHIN ) 1 g in sodium chloride  0.9 % 100 mL IVPB  1 g Intravenous Q24H Garba, Mohammad L, MD       dextrose  5 % in lactated ringers  infusion   Intravenous Continuous Sim Emery CROME, MD       enoxaparin  (LOVENOX ) injection 40 mg  40 mg Subcutaneous Q24H Sim Emery CROME, MD       lidocaine  (XYLOCAINE ) 2 % jelly 1 Application  1 Application Urethral Once Yao, David Hsienta, MD       morphine  (PF) 2 MG/ML injection 2 mg  2 mg Intravenous Q2H PRN Sim Emery CROME, MD       ondansetron  (ZOFRAN ) tablet 4 mg  4 mg Oral Q6H PRN Sim Emery CROME, MD       Or   ondansetron  (ZOFRAN ) injection 4 mg  4 mg Intravenous Q6H PRN Sim Emery CROME, MD       Current Outpatient Medications  Medication Sig Dispense Refill   aspirin  EC 81 MG tablet Take 81 mg by mouth in the morning. Swallow whole.     pravastatin  (PRAVACHOL ) 80 MG tablet Take 80 mg by mouth at bedtime.     senna (SENOKOT) 8.6 MG TABS tablet Take 1 tablet by mouth at bedtime.     tamsulosin  (FLOMAX ) 0.4 MG CAPS capsule Take 0.4 mg by mouth daily.     traZODone  (DESYREL ) 50 MG tablet Take 25 mg by mouth at bedtime.  Vitamin D, Ergocalciferol, (DRISDOL) 1.25 MG (50000 UNIT) CAPS capsule Take 50,000 Units by mouth See admin instructions. Take 50,000 units by mouth on the 7th of every month      Allergies: Allergies  Allergen Reactions   Tuberculin Purified Protein Derivative Other (See Comments)    Allergic, per [facility]   Tuberculin Tests Other (See Comments)    Allergic, per [facility]    Social History: Social History   Tobacco Use   Smoking status: Former    Current packs/day: 0.00    Types: Cigarettes    Quit date: 11/11/2011    Years since quitting: 11.4   Smokeless tobacco: Never  Substance Use Topics   Alcohol use: No    Comment: hx of ETOH abuse   Drug use: No    Family History Family History  Problem Relation Age of Onset   Colon cancer Neg Hx     Review of Systems 10  systems were reviewed and are negative except as noted specifically in the HPI.  Objective   Vital signs in last 24 hours: BP 99/61 (BP Location: Left Arm)   Pulse 96   Temp 99.7 F (37.6 C) (Axillary)   Resp 17   SpO2 98%   Physical Exam General: NAD, A&O, resting, Appears mildly confused HEENT: [place]/AT, EOMI, MMM Pulmonary: Normal work of breathing Cardiovascular: HDS, adequate peripheral perfusion Abdomen: Soft, distended suprapubic area GU: significant phimosis unable to be pulled back, foul smelling urine Extremities: warm and well perfused Neuro: Appropriate, no focal neurological deficits  Most Recent Labs: Lab Results  Component Value Date   WBC 12.1 (H) 05/04/2023   HGB 11.1 (L) 05/04/2023   HCT 34.6 (L) 05/04/2023   PLT 175 05/04/2023    Lab Results  Component Value Date   NA 145 05/04/2023   K 4.0 05/04/2023   CL 111 05/04/2023   CO2 22 05/04/2023   BUN 64 (H) 05/04/2023   CREATININE 3.88 (H) 05/04/2023   CALCIUM 9.0 05/04/2023   MG 1.7 09/06/2009    Lab Results  Component Value Date   INR 1.29 10/17/2011     Urine Culture: @[id number] (laburin,org,[id number])@   IMAGING: CT ABDOMEN PELVIS WO CONTRAST Result Date: 05/04/2023 CLINICAL DATA:  Clemens, head trauma, abdominal pain EXAM: CT ABDOMEN AND PELVIS WITHOUT CONTRAST TECHNIQUE: Multidetector CT imaging of the abdomen and pelvis was performed following the standard protocol without IV contrast. Unenhanced CT was performed per clinician order. Lack of IV contrast limits sensitivity and specificity, especially for evaluation of abdominal/pelvic solid viscera and vascular structures in the setting of trauma. RADIATION DOSE REDUCTION: This exam was performed according to the departmental dose-optimization program which includes automated exposure control, adjustment of the mA and/or kV according to patient size and/or use of iterative reconstruction technique. COMPARISON:  None Available. FINDINGS: Lower  chest: No acute pleural or parenchymal lung disease. Hepatobiliary: Scattered hepatic hypodensities are identified, most compatible with cysts. No evidence of acute liver abnormality on this limited unenhanced exam. The gallbladder is unremarkable. Pancreas: Unremarkable unenhanced appearance. Spleen: Unremarkable unenhanced appearance. Adrenals/Urinary Tract: There is diffuse bladder wall thickening, with trabeculations and marked distension consistent with chronic bladder outlet obstruction. There is bilateral hydroureteronephrosis, likely due to bladder distension. No urinary tract calculi. No evidence of renal injury on this unenhanced exam. The adrenals are unremarkable. Stomach/Bowel: No bowel obstruction or ileus. Normal appendix right lower quadrant. Scattered colonic diverticulosis without evidence of acute diverticulitis. No bowel wall thickening or inflammatory change. Vascular/Lymphatic: Aortic atherosclerosis. No enlarged  abdominal or pelvic lymph nodes. Reproductive: Prostate is enlarged, measuring 5.8 x 6.4 x 5.6 cm. Other: No free fluid or free intraperitoneal gas. No abdominal wall hernia. Ventriculostomy catheter enters the abdomen in the right upper quadrant, distal tip within the left lower quadrant. No evidence of catheter discontinuity. Musculoskeletal: No acute or destructive bony abnormalities. Prior ORIF right femur. Chronic L1 compression deformity. Reconstructed images demonstrate no additional findings. IMPRESSION: 1. No acute intra-abdominal or intrapelvic trauma on this unenhanced exam. 2. 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 3. Bilateral hydroureteronephrosis, likely due to bladder distension and chronic bladder outlet obstruction. No urinary tract calculi. 4. Colonic diverticulosis without diverticulitis. 5.  Aortic Atherosclerosis (ICD10-I70.0). Electronically Signed   By: Ozell Daring M.D.   On:  05/04/2023 19:13   CT Head Wo Contrast Result Date: 05/04/2023 CLINICAL DATA:  Head and neck trauma EXAM: CT HEAD WITHOUT CONTRAST CT CERVICAL SPINE WITHOUT CONTRAST TECHNIQUE: Multidetector CT imaging of the head and cervical spine was performed following the standard protocol without intravenous contrast. Multiplanar CT image reconstructions of the cervical spine were also generated. RADIATION DOSE REDUCTION: This exam was performed according to the departmental dose-optimization program which includes automated exposure control, adjustment of the mA and/or kV according to patient size and/or use of iterative reconstruction technique. COMPARISON:  01/13/2017 FINDINGS: CT HEAD FINDINGS Brain: No evidence of acute infarction, hemorrhage, extra-axial collection or mass lesion/mass effect. Unchanged caliber and configuration of the ventricles, with right parietal and right occipital approach intraventricular shunt catheters. Periventricular white matter as well as encephalomalacia of the cerebellum and marked prominence of the fourth ventricle. Vascular: No hyperdense vessel or unexpected calcification. Skull: Normal. Negative for fracture or focal lesion. Sinuses/Orbits: No acute finding. Other: None. CT CERVICAL SPINE FINDINGS Alignment: Degenerative straightening and reversal of the normal cervical lordosis. Skull base and vertebrae: No acute fracture. No primary bone lesion or focal pathologic process. Soft tissues and spinal canal: No prevertebral fluid or swelling. No visible canal hematoma. Disc levels: Severe disc degenerative disease of the mid to lower cervical spine. Upper chest: Negative. Other: None. IMPRESSION: 1. No acute intracranial pathology. 2. Unchanged caliber and configuration of the ventricles, with right parietal and right occipital approach intraventricular shunt catheters. Periventricular white matter as well as encephalomalacia of the cerebellum and marked prominence of the fourth  ventricle. 3. No fracture or static subluxation of the cervical spine. 4. Severe disc degenerative disease of the mid to lower cervical spine. Electronically Signed   By: Marolyn JONETTA Jaksch M.D.   On: 05/04/2023 13:43   CT Cervical Spine Wo Contrast Result Date: 05/04/2023 CLINICAL DATA:  Head and neck trauma EXAM: CT HEAD WITHOUT CONTRAST CT CERVICAL SPINE WITHOUT CONTRAST TECHNIQUE: Multidetector CT imaging of the head and cervical spine was performed following the standard protocol without intravenous contrast. Multiplanar CT image reconstructions of the cervical spine were also generated. RADIATION DOSE REDUCTION: This exam was performed according to the departmental dose-optimization program which includes automated exposure control, adjustment of the mA and/or kV according to patient size and/or use of iterative reconstruction technique. COMPARISON:  01/13/2017 FINDINGS: CT HEAD FINDINGS Brain: No evidence of acute infarction, hemorrhage, extra-axial collection or mass lesion/mass effect. Unchanged caliber and configuration of the ventricles, with right parietal and right occipital approach intraventricular shunt catheters. Periventricular white matter as well as encephalomalacia of the cerebellum and marked prominence of the fourth ventricle. Vascular: No hyperdense vessel or unexpected calcification. Skull: Normal.  Negative for fracture or focal lesion. Sinuses/Orbits: No acute finding. Other: None. CT CERVICAL SPINE FINDINGS Alignment: Degenerative straightening and reversal of the normal cervical lordosis. Skull base and vertebrae: No acute fracture. No primary bone lesion or focal pathologic process. Soft tissues and spinal canal: No prevertebral fluid or swelling. No visible canal hematoma. Disc levels: Severe disc degenerative disease of the mid to lower cervical spine. Upper chest: Negative. Other: None. IMPRESSION: 1. No acute intracranial pathology. 2. Unchanged caliber and configuration of the  ventricles, with right parietal and right occipital approach intraventricular shunt catheters. Periventricular white matter as well as encephalomalacia of the cerebellum and marked prominence of the fourth ventricle. 3. No fracture or static subluxation of the cervical spine. 4. Severe disc degenerative disease of the mid to lower cervical spine. Electronically Signed   By: Marolyn JONETTA Jaksch M.D.   On: 05/04/2023 13:43   DG Chest 2 View Result Date: 05/04/2023 CLINICAL DATA:  Patient slipped and fell while getting into bed. EXAM: CHEST - 2 VIEW COMPARISON:  None Available. FINDINGS: The lungs are clear without focal pneumonia, edema, pneumothorax or pleural effusion. The cardiopericardial silhouette is within normal limits for size. VP shunt tubing overlies the medial right chest. No acute bony abnormality. IMPRESSION: No active cardiopulmonary disease. Electronically Signed   By: Camellia Candle M.D.   On: 05/04/2023 13:24    ------  Assessment:  73 y.o. male with past medical history significant for dementia who presents from facility after fall and was noticed to have blood-tinged urine and inability to urinate.  Urology was consulted given inability place a catheter.  I was able to successfully place a catheter over a sensor wire.  It the patient has significant phimosis, and likely has a hematoma to his glans penis.  He would benefit from a future circumcision.  Patient is in florid renal failure at this time, with creatinine of 3.88.  This is very much likely due to longstanding obstruction.  Upon placement of the Foley catheter, there was extremely dark urine that appeared uremic.  I have low suspicion that his urine will create significant clotting, and will defer CBI at this time.  Patient will need to be admitted to medicine for close intake and output monitoring in the setting of likely postobstructive diuresis.  He also need to keep his catheter for prolonged period of  time  Recommendations: - continue foley catheter  - Recommend close observation for post obstructive diuresis in the setting of longstanding urinary retention -We defer CBI at this time.  Nursing to irrigate the catheter as needed.  If CBI were needed, the patient's phimosis would likely need to be stretched with dilators over a sensor wire to be able to accommodate a larger bore catheter, there is extremely difficult to even place a 16 French council tip -Follow-up urine culture, will treat empirically with antibiotics until culture results   Lynwood LELON Decamp, MD Resident Physician [facility]  Thank you for this consult. Please contact the urology consult pager with any further questions/concerns.

## 2023-05-04 NOTE — Procedures (Signed)
 Foley Catheter Placement Note  Indications: 73 y.o. male with urinary retention  Pre-operative Diagnosis: Urinary retention  Post-operative Diagnosis: Same  Surgeon: Lynwood Decamp, MD  Assistants: None  Procedure Details  Patient was placed in the supine position, prepped with Betadine and draped in the usual sterile fashion.  There was significant phimosis, that was unable to be retracted.  It felt very adherent to the glans.  I advanced a sensor wire and was able to advance this into the urethral meatus all the way into the bladder.  I then took a council tip, with copious lubrication, advances over the sensor wire into the bladder, and remove the sensor wire.  The catheter immediately began to drain very dark uremic like urine.  10 cc of sterile water was used to inflate the balloon port  Complications: None; patient tolerated the procedure well.  Plan:   1.  Continue Foley catheter given significant urinary retention.  Patient will also need strict I/O monitoring given what appears to be longstanding urinary retention and acute renal failure 2.  Medicine team should monitor for postoperative diuresis in the setting of longstanding urinary obstruction and renal failure 3.  Patient will likely need a circumcision in the future given his significant phimosis       Attending Attestation: Dr. Sherwood Edison was available.  Lynwood LELON Decamp, MD Resident Physician [facility]

## 2023-05-04 NOTE — ED Triage Notes (Signed)
 Pt BIB EMS from [facility] due to mechanical fall. Pt fell try to get into bed; slipped and hit forehead. Laceration to forehead bleeding controlled. No LOC, pt takes baby aspirin . Hx of Left sided paralysis; vascular dementia. Pt is hard of hearing.

## 2023-05-05 DIAGNOSIS — N179 Acute kidney failure, unspecified: Secondary | ICD-10-CM | POA: Diagnosis not present

## 2023-05-05 DIAGNOSIS — N139 Obstructive and reflux uropathy, unspecified: Secondary | ICD-10-CM | POA: Diagnosis present

## 2023-05-05 LAB — CBC
HCT: 34.9 % — ABNORMAL LOW (ref 39.0–52.0)
Hemoglobin: 11.3 g/dL — ABNORMAL LOW (ref 13.0–17.0)
MCH: 27.8 pg (ref 26.0–34.0)
MCHC: 32.4 g/dL (ref 30.0–36.0)
MCV: 85.7 fL (ref 80.0–100.0)
Platelets: 166 10*3/uL (ref 150–400)
RBC: 4.07 MIL/uL — ABNORMAL LOW (ref 4.22–5.81)
RDW: 14.6 % (ref 11.5–15.5)
WBC: 12.6 10*3/uL — ABNORMAL HIGH (ref 4.0–10.5)
nRBC: 0 % (ref 0.0–0.2)

## 2023-05-05 LAB — BASIC METABOLIC PANEL
Anion gap: 10 (ref 5–15)
BUN: 43 mg/dL — ABNORMAL HIGH (ref 8–23)
CO2: 22 mmol/L (ref 22–32)
Calcium: 8.1 mg/dL — ABNORMAL LOW (ref 8.9–10.3)
Chloride: 112 mmol/L — ABNORMAL HIGH (ref 98–111)
Creatinine, Ser: 1.9 mg/dL — ABNORMAL HIGH (ref 0.61–1.24)
GFR, Estimated: 37 mL/min — ABNORMAL LOW (ref >60)
Glucose, Bld: 98 mg/dL (ref 70–99)
Potassium: 3.7 mmol/L (ref 3.5–5.1)
Sodium: 144 mmol/L (ref 135–145)

## 2023-05-05 MED ORDER — TAMSULOSIN HCL 0.4 MG PO CAPS
0.4000 mg | ORAL_CAPSULE | Freq: Every day | ORAL | Status: DC
  Administered 2023-05-05 – 2023-05-07 (×3): 0.4 mg via ORAL
  Filled 2023-05-05 (×3): qty 1

## 2023-05-05 MED ORDER — SODIUM CHLORIDE 0.9 % IV SOLN
INTRAVENOUS | Status: DC
Start: 2023-05-05 — End: 2023-05-06

## 2023-05-05 NOTE — Progress Notes (Signed)
 PROGRESS NOTE  Algert Ryland  WUJ:[id number] DOB: 1950-10-06 DOA: 05/04/2023 PCP: Dean Every, MD   Brief Narrative: Patient is a 82 male with history of dementia, hypertension, previous history of alcohol use/polysubstance abuse, CVA with left residual hemiparesis who was brought from [facility] from rehab after he fell.  He was trying to get up into bed from his wheelchair, fell and struck his forehead. No report of loss of consciousness.  On presentation, he was confused.  Found to have massive bladder distention and also severe phimosis with obstructive uropathy and bilateral hydronephrosis.  Urology consulted.  Foley placed.  Lab work showed AKI with creatinine in the range of 3.  Started on antibiotics for possible UTI as well.  Urology following  Assessment & Plan:  Principal Problem:   AKI (acute kidney injury) (HCC) Active Problems:   HTN (hypertension)   Obstructed, uropathy   Obstructive uropathy: Found to have severe bladder distention, phimosis.  Urology consulted.  CT imaging showed enlarged prostate with diffuse bladder wall thickening, trabeculation, bilateral hydroureteronephrosis.  Foley has been placed.  Continue Flomax .  Urine is pinkish today, hemoglobin stable  AKI: Likely from urine retention.  Continue Foley catheter.  Checking BMP today  Suspected UTI: Currently on ceftriaxone .  Has mild leukocytosis.  Afebrile.  Follow-up urine culture  Dementia: Currently mentation at baseline.  Oriented to place only.  Continue to monitor.  Continue delirium precaution, frequent reorientation.  Hypertension: Continue to monitor blood pressure  History of hyperlipidemia: Takes pravastatin  at home.  Debility/deconditioning/fall: Lives at nursing facility.  Uses wheelchair for ambulation.  PT/OT consulted.        DVT prophylaxis:enoxaparin  (LOVENOX ) injection 30 mg Start: 05/05/23 1000     Code Status: Full Code  Family Communication: None at bedside  Patient  status:Inpatient  Patient is from :SNF  Anticipated discharge to:SNF  Estimated DC date:1-2 days, awaiting improvement in the kidney function, urology clearance   Consultants: Urology  Procedures: Foley placement  Antimicrobials:  Anti-infectives (From admission, onward)    Start     Dose/Rate Route Frequency Ordered Stop   05/04/23 2145  cefTRIAXone  (ROCEPHIN ) 1 g in sodium chloride  0.9 % 100 mL IVPB        1 g 200 mL/hr over 30 Minutes Intravenous Every 24 hours 05/04/23 2140     05/04/23 2030  cefTRIAXone  (ROCEPHIN ) 2 g in sodium chloride  0.9 % 100 mL IVPB        2 g 200 mL/hr over 30 Minutes Intravenous  Once 05/04/23 2021 05/04/23 2236       Subjective: Patient seen and examined at bedside today.  Appears comfortable.  Mentation at baseline.  Oriented to place.  Not agitated.  Lying on bed.  Appears chronically deconditioned.  Has Foley with drainage of pinkish urine.  Objective: Vitals:   05/04/23 1124 05/04/23 1637 05/05/23 0018 05/05/23 0516  BP: 124/74 99/61 (!) 122/90 (!) 122/58  Pulse: 88 96 85 81  Resp: 18 17 16 16   Temp: 98.3 F (36.8 C) 99.7 F (37.6 C) 98.1 F (36.7 C) 98.2 F (36.8 C)  TempSrc:  Axillary Oral Oral  SpO2: 100% 98% 100% 99%    Intake/Output Summary (Last 24 hours) at 05/05/2023 0827 Last data filed at 05/05/2023 9562 Gross per 24 hour  Intake --  Output 2950 ml  Net -2950 ml   There were no vitals filed for this visit.  Examination:  General exam: Overall comfortable, not in distress, chronically deconditioned HEENT: PERRL, left facial  droop Respiratory system:  no wheezes or crackles  Cardiovascular system: S1 & S2 heard, RRR.  Gastrointestinal system: Abdomen is nondistended, soft and nontender. Central nervous system: Alert and awake, oriented to place, tells current month, left hemiplegia with contracture of left upper extremity, left facial droop Extremities: No edema, no clubbing ,no cyanosis Skin: No rashes, no  ulcers,no icterus  GU: Foley    Data Reviewed: I have personally reviewed following labs and imaging studies  CBC: Recent Labs  Lab 05/04/23 1657  WBC 12.1*  NEUTROABS 10.8*  HGB 11.1*  HCT 34.6*  MCV 85.4  PLT 175   Basic Metabolic Panel: Recent Labs  Lab 05/04/23 1657  NA 145  K 4.0  CL 111  CO2 22  GLUCOSE 105*  BUN 64*  CREATININE 3.88*  CALCIUM 9.0     No results found for this or any previous visit (from the past 240 hours).   Radiology Studies: CT ABDOMEN PELVIS WO CONTRAST Result Date: 05/04/2023 CLINICAL DATA:  [name], head trauma, abdominal pain EXAM: CT ABDOMEN AND PELVIS WITHOUT CONTRAST TECHNIQUE: Multidetector CT imaging of the abdomen and pelvis was performed following the standard protocol without IV contrast. Unenhanced CT was performed per clinician order. Lack of IV contrast limits sensitivity and specificity, especially for evaluation of abdominal/pelvic solid viscera and vascular structures in the setting of trauma. RADIATION DOSE REDUCTION: This exam was performed according to the departmental dose-optimization program which includes automated exposure control, adjustment of the mA and/or kV according to patient size and/or use of iterative reconstruction technique. COMPARISON:  None Available. FINDINGS: Lower chest: No acute pleural or parenchymal lung disease. Hepatobiliary: Scattered hepatic hypodensities are identified, most compatible with cysts. No evidence of acute liver abnormality on this limited unenhanced exam. The gallbladder is unremarkable. Pancreas: Unremarkable unenhanced appearance. Spleen: Unremarkable unenhanced appearance. Adrenals/Urinary Tract: There is diffuse bladder wall thickening, with trabeculations and marked distension consistent with chronic bladder outlet obstruction. There is bilateral hydroureteronephrosis, likely due to bladder distension. No urinary tract calculi. No evidence of renal injury on this unenhanced exam. The  adrenals are unremarkable. Stomach/Bowel: No bowel obstruction or ileus. Normal appendix right lower quadrant. Scattered colonic diverticulosis without evidence of acute diverticulitis. No bowel wall thickening or inflammatory change. Vascular/Lymphatic: Aortic atherosclerosis. No enlarged abdominal or pelvic lymph nodes. Reproductive: Prostate is enlarged, measuring 5.8 x 6.4 x 5.6 cm. Other: No free fluid or free intraperitoneal gas. No abdominal wall hernia. Ventriculostomy catheter enters the abdomen in the right upper quadrant, distal tip within the left lower quadrant. No evidence of catheter discontinuity. Musculoskeletal: No acute or destructive bony abnormalities. Prior ORIF right femur. Chronic L1 compression deformity. Reconstructed images demonstrate no additional findings. IMPRESSION: 1. No acute intra-abdominal or intrapelvic trauma on this unenhanced exam. 2. 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 3. Bilateral hydroureteronephrosis, likely due to bladder distension and chronic bladder outlet obstruction. No urinary tract calculi. 4. Colonic diverticulosis without diverticulitis. 5.  Aortic Atherosclerosis (ICD10-I70.0). Electronically Signed   By: Bobbye Burrow M.D.   On: 05/04/2023 19:13   CT Head Wo Contrast Result Date: 05/04/2023 CLINICAL DATA:  Head and neck trauma EXAM: CT HEAD WITHOUT CONTRAST CT CERVICAL SPINE WITHOUT CONTRAST TECHNIQUE: Multidetector CT imaging of the head and cervical spine was performed following the standard protocol without intravenous contrast. Multiplanar CT image reconstructions of the cervical spine were also generated. RADIATION DOSE REDUCTION: This exam was performed according to the departmental dose-optimization  program which includes automated exposure control, adjustment of the mA and/or kV according to patient size and/or use of iterative reconstruction technique. COMPARISON:   01/13/2017 FINDINGS: CT HEAD FINDINGS Brain: No evidence of acute infarction, hemorrhage, extra-axial collection or mass lesion/mass effect. Unchanged caliber and configuration of the ventricles, with right parietal and right occipital approach intraventricular shunt catheters. Periventricular white matter as well as encephalomalacia of the cerebellum and marked prominence of the fourth ventricle. Vascular: No hyperdense vessel or unexpected calcification. Skull: Normal. Negative for fracture or focal lesion. Sinuses/Orbits: No acute finding. Other: None. CT CERVICAL SPINE FINDINGS Alignment: Degenerative straightening and reversal of the normal cervical lordosis. Skull base and vertebrae: No acute fracture. No primary bone lesion or focal pathologic process. Soft tissues and spinal canal: No prevertebral fluid or swelling. No visible canal hematoma. Disc levels: Severe disc degenerative disease of the mid to lower cervical spine. Upper chest: Negative. Other: None. IMPRESSION: 1. No acute intracranial pathology. 2. Unchanged caliber and configuration of the ventricles, with right parietal and right occipital approach intraventricular shunt catheters. Periventricular white matter as well as encephalomalacia of the cerebellum and marked prominence of the fourth ventricle. 3. No fracture or static subluxation of the cervical spine. 4. Severe disc degenerative disease of the mid to lower cervical spine. Electronically Signed   By: Fredricka Jenny M.D.   On: 05/04/2023 13:43   CT Cervical Spine Wo Contrast Result Date: 05/04/2023 CLINICAL DATA:  Head and neck trauma EXAM: CT HEAD WITHOUT CONTRAST CT CERVICAL SPINE WITHOUT CONTRAST TECHNIQUE: Multidetector CT imaging of the head and cervical spine was performed following the standard protocol without intravenous contrast. Multiplanar CT image reconstructions of the cervical spine were also generated. RADIATION DOSE REDUCTION: This exam was performed according to the  departmental dose-optimization program which includes automated exposure control, adjustment of the mA and/or kV according to patient size and/or use of iterative reconstruction technique. COMPARISON:  01/13/2017 FINDINGS: CT HEAD FINDINGS Brain: No evidence of acute infarction, hemorrhage, extra-axial collection or mass lesion/mass effect. Unchanged caliber and configuration of the ventricles, with right parietal and right occipital approach intraventricular shunt catheters. Periventricular white matter as well as encephalomalacia of the cerebellum and marked prominence of the fourth ventricle. Vascular: No hyperdense vessel or unexpected calcification. Skull: Normal. Negative for fracture or focal lesion. Sinuses/Orbits: No acute finding. Other: None. CT CERVICAL SPINE FINDINGS Alignment: Degenerative straightening and reversal of the normal cervical lordosis. Skull base and vertebrae: No acute fracture. No primary bone lesion or focal pathologic process. Soft tissues and spinal canal: No prevertebral fluid or swelling. No visible canal hematoma. Disc levels: Severe disc degenerative disease of the mid to lower cervical spine. Upper chest: Negative. Other: None. IMPRESSION: 1. No acute intracranial pathology. 2. Unchanged caliber and configuration of the ventricles, with right parietal and right occipital approach intraventricular shunt catheters. Periventricular white matter as well as encephalomalacia of the cerebellum and marked prominence of the fourth ventricle. 3. No fracture or static subluxation of the cervical spine. 4. Severe disc degenerative disease of the mid to lower cervical spine. Electronically Signed   By: Fredricka Jenny M.D.   On: 05/04/2023 13:43   DG Chest 2 View Result Date: 05/04/2023 CLINICAL DATA:  Patient slipped and fell while getting into bed. EXAM: CHEST - 2 VIEW COMPARISON:  None Available. FINDINGS: The lungs are clear without focal pneumonia, edema, pneumothorax or pleural  effusion. The cardiopericardial silhouette is within normal limits for size. VP shunt tubing overlies the medial right  chest. No acute bony abnormality. IMPRESSION: No active cardiopulmonary disease. Electronically Signed   By: Donnal Fusi M.D.   On: 05/04/2023 13:24    Scheduled Meds:  enoxaparin  (LOVENOX ) injection  30 mg Subcutaneous Q24H   Continuous Infusions:  cefTRIAXone  (ROCEPHIN )  IV     dextrose  5% lactated ringers  40 mL/hr at 05/05/23 0050     LOS: 1 day   Leona Rake, MD [facility] P2/12/2023, 8:27 AM

## 2023-05-05 NOTE — Consult Note (Signed)
 Urology Consult Note   Requesting Attending Physician:  Kurt Rake, MD Service Providing Consult: Urology  Consulting Attending: Leila Walton  Reason for Consult:  urinary retention  HPI: Kurt Walton is seen in consultation for reasons noted above at the request of Kurt Rake, MD for evaluation of urinary retention and difficult catheter placement  This is a 73 y.o. male with past medical history significant for dementia.  He presented from an outside facility after falling, and was found to be unable to urinate.  He was also noticed to have red-tinged urine coming from his penis.  Catheter was attempted by nursing staff and ED providers, but his significant phimosis prevented passage of any size catheter.  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   Hard of hearing    Hx of adenomatous colonic polyps 01/29/2016   Hypertension    Substance abuse (HCC)    ETOH, past   Urinary incontinence     Past Surgical History:  Past Surgical History:  Procedure Laterality Date   HIP SURGERY     ORIF ANKLE FRACTURE Left 01/04/2021   Procedure: OPEN REDUCTION INTERNAL FIXATION (ORIF) ANKLE FRACTURE;  Surgeon: Kurt Gables, MD;  Location: [facility];  Service: Orthopedics;  Laterality: Left;  LENGHT OF SURGERY: 90 MINUTES   TUMOR REMOVAL     not tumor, but aneurysm   VENTRICULOPERITONEAL SHUNT  10/17/2011   Procedure: SHUNT INSERTION VENTRICULAR-PERITONEAL;  Surgeon: Kurt Greening, MD;  Location: [facility];  Service: Neurosurgery;  Laterality: Right;  Instertion of Ventricular-Peritoneal Shunt   VENTRICULOPERITONEAL SHUNT     once at age 66 and replaced 2013.    Medication: Current Facility-Administered Medications  Medication Dose Route Frequency Provider Last Rate Last Admin   0.9 %  sodium chloride  infusion  500 mL Intravenous Continuous  Kurt Peacemaker, MD       cefTRIAXone  (ROCEPHIN ) 1 g in sodium chloride  0.9 % 100 mL IVPB  1 g Intravenous Q24H Walton, Kurt L, MD       dextrose  5 % in lactated ringers  infusion   Intravenous Continuous Kurt Espy, MD 40 mL/hr at 05/05/23 0050 New Bag at 05/05/23 0050   enoxaparin  (LOVENOX ) injection 30 mg  30 mg Subcutaneous Q24H Walton, Kurt L, MD       morphine  (PF) 2 MG/ML injection 2 mg  2 mg Intravenous Q2H PRN Walton, Kurt L, MD       ondansetron  (ZOFRAN ) tablet 4 mg  4 mg Oral Q6H PRN Kurt Espy, MD       Or   ondansetron  (ZOFRAN ) injection 4 mg  4 mg Intravenous Q6H PRN Kurt Espy, MD       Current Outpatient Medications  Medication Sig Dispense Refill   aspirin  EC 81 MG tablet Take 81 mg by mouth in the morning. Swallow whole.     pravastatin  (PRAVACHOL ) 80 MG tablet Take 80 mg by mouth at bedtime.     senna (SENOKOT) 8.6 MG TABS tablet Take 1 tablet by mouth at bedtime.     tamsulosin  (FLOMAX ) 0.4 MG CAPS capsule Take 0.4 mg by mouth daily.     traZODone  (DESYREL ) 50 MG tablet Take 25 mg by mouth at bedtime.     Vitamin D, Ergocalciferol, (DRISDOL) 1.25 MG (50000 UNIT) CAPS capsule Take 50,000 Units by  mouth See admin instructions. Take 50,000 units by mouth on the 7th of every month      Allergies: Allergies  Allergen Reactions   Tuberculin Purified Protein Derivative Other (See Comments)    "Allergic," per [facility]   Tuberculin Tests Other (See Comments)    "Allergic," per [facility]    Social History: Social History   Tobacco Use   Smoking status: Former    Current packs/day: 0.00    Types: Cigarettes    Quit date: 11/11/2011    Years since quitting: 11.4   Smokeless tobacco: Never  Substance Use Topics   Alcohol use: No    Comment: hx of ETOH abuse   Drug use: No    Family History Family History  Problem Relation Age of Onset   Colon cancer Neg Hx     Review of Systems 10 systems were reviewed and are negative except as noted  specifically in the HPI.  Objective   Vital signs in last 24 hours: BP (!) 122/58   Pulse 81   Temp 98.2 F (36.8 C) (Oral)   Resp 16   SpO2 99%   Physical Exam General: NAD, A&O, resting, Appears mildly confused HEENT: Kurt Walton/AT, EOMI, MMM Pulmonary: Normal work of breathing Cardiovascular: HDS, adequate peripheral perfusion Abdomen: Soft, distended suprapubic area GU: significant phimosis unable to be pulled back, foul smelling urine Extremities: warm and well perfused Neuro: Appropriate, no focal neurological deficits  Most Recent Labs: Lab Results  Component Value Date   WBC 12.1 (H) 05/04/2023   HGB 11.1 (L) 05/04/2023   HCT 34.6 (L) 05/04/2023   PLT 175 05/04/2023    Lab Results  Component Value Date   NA 145 05/04/2023   K 4.0 05/04/2023   CL 111 05/04/2023   CO2 22 05/04/2023   BUN 64 (H) 05/04/2023   CREATININE 3.88 (H) 05/04/2023   CALCIUM 9.0 05/04/2023   MG 1.7 09/06/2009    Lab Results  Component Value Date   INR 1.29 10/17/2011     Urine Culture: @[id number] (laburin,org,[id number],[id number])@   IMAGING: CT ABDOMEN PELVIS WO CONTRAST Result Date: 05/04/2023 CLINICAL DATA:  Kurt Walton, head trauma, abdominal pain EXAM: CT ABDOMEN AND PELVIS WITHOUT CONTRAST TECHNIQUE: Multidetector CT imaging of the abdomen and pelvis was performed following the standard protocol without IV contrast. Unenhanced CT was performed per clinician order. Lack of IV contrast limits sensitivity and specificity, especially for evaluation of abdominal/pelvic solid viscera and vascular structures in the setting of trauma. RADIATION DOSE REDUCTION: This exam was performed according to the departmental dose-optimization program which includes automated exposure control, adjustment of the mA and/or kV according to patient size and/or use of iterative reconstruction technique. COMPARISON:  None Available. FINDINGS: Lower chest: No acute pleural or parenchymal lung disease. Hepatobiliary: Scattered  hepatic hypodensities are identified, most compatible with cysts. No evidence of acute liver abnormality on this limited unenhanced exam. The gallbladder is unremarkable. Pancreas: Unremarkable unenhanced appearance. Spleen: Unremarkable unenhanced appearance. Adrenals/Urinary Tract: There is diffuse bladder wall thickening, with trabeculations and marked distension consistent with chronic bladder outlet obstruction. There is bilateral hydroureteronephrosis, likely due to bladder distension. No urinary tract calculi. No evidence of renal injury on this unenhanced exam. The adrenals are unremarkable. Stomach/Bowel: No bowel obstruction or ileus. Normal appendix right lower quadrant. Scattered colonic diverticulosis without evidence of acute diverticulitis. No bowel wall thickening or inflammatory change. Vascular/Lymphatic: Aortic atherosclerosis. No enlarged abdominal or pelvic lymph nodes. Reproductive: Prostate is enlarged, measuring 5.8 x 6.4 x 5.6 cm. Other:  No free fluid or free intraperitoneal gas. No abdominal wall hernia. Ventriculostomy catheter enters the abdomen in the right upper quadrant, distal tip within the left lower quadrant. No evidence of catheter discontinuity. Musculoskeletal: No acute or destructive bony abnormalities. Prior ORIF right femur. Chronic L1 compression deformity. Reconstructed images demonstrate no additional findings. IMPRESSION: 1. No acute intra-abdominal or intrapelvic trauma on this unenhanced exam. 2. 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 3. Bilateral hydroureteronephrosis, likely due to bladder distension and chronic bladder outlet obstruction. No urinary tract calculi. 4. Colonic diverticulosis without diverticulitis. 5.  Aortic Atherosclerosis (ICD10-I70.0). Electronically Signed   By: Bobbye Burrow M.D.   On: 05/04/2023 19:13   CT Head Wo Contrast Result Date: 05/04/2023 CLINICAL DATA:   Head and neck trauma EXAM: CT HEAD WITHOUT CONTRAST CT CERVICAL SPINE WITHOUT CONTRAST TECHNIQUE: Multidetector CT imaging of the head and cervical spine was performed following the standard protocol without intravenous contrast. Multiplanar CT image reconstructions of the cervical spine were also generated. RADIATION DOSE REDUCTION: This exam was performed according to the departmental dose-optimization program which includes automated exposure control, adjustment of the mA and/or kV according to patient size and/or use of iterative reconstruction technique. COMPARISON:  01/13/2017 FINDINGS: CT HEAD FINDINGS Brain: No evidence of acute infarction, hemorrhage, extra-axial collection or mass lesion/mass effect. Unchanged caliber and configuration of the ventricles, with right parietal and right occipital approach intraventricular shunt catheters. Periventricular white matter as well as encephalomalacia of the cerebellum and marked prominence of the fourth ventricle. Vascular: No hyperdense vessel or unexpected calcification. Skull: Normal. Negative for fracture or focal lesion. Sinuses/Orbits: No acute finding. Other: None. CT CERVICAL SPINE FINDINGS Alignment: Degenerative straightening and reversal of the normal cervical lordosis. Skull base and vertebrae: No acute fracture. No primary bone lesion or focal pathologic process. Soft tissues and spinal canal: No prevertebral fluid or swelling. No visible canal hematoma. Disc levels: Severe disc degenerative disease of the mid to lower cervical spine. Upper chest: Negative. Other: None. IMPRESSION: 1. No acute intracranial pathology. 2. Unchanged caliber and configuration of the ventricles, with right parietal and right occipital approach intraventricular shunt catheters. Periventricular white matter as well as encephalomalacia of the cerebellum and marked prominence of the fourth ventricle. 3. No fracture or static subluxation of the cervical spine. 4. Severe disc  degenerative disease of the mid to lower cervical spine. Electronically Signed   By: Fredricka Jenny M.D.   On: 05/04/2023 13:43   CT Cervical Spine Wo Contrast Result Date: 05/04/2023 CLINICAL DATA:  Head and neck trauma EXAM: CT HEAD WITHOUT CONTRAST CT CERVICAL SPINE WITHOUT CONTRAST TECHNIQUE: Multidetector CT imaging of the head and cervical spine was performed following the standard protocol without intravenous contrast. Multiplanar CT image reconstructions of the cervical spine were also generated. RADIATION DOSE REDUCTION: This exam was performed according to the departmental dose-optimization program which includes automated exposure control, adjustment of the mA and/or kV according to patient size and/or use of iterative reconstruction technique. COMPARISON:  01/13/2017 FINDINGS: CT HEAD FINDINGS Brain: No evidence of acute infarction, hemorrhage, extra-axial collection or mass lesion/mass effect. Unchanged caliber and configuration of the ventricles, with right parietal and right occipital approach intraventricular shunt catheters. Periventricular white matter as well as encephalomalacia of the cerebellum and marked prominence of the fourth ventricle. Vascular: No hyperdense vessel or unexpected calcification. Skull: Normal. Negative for fracture or focal lesion. Sinuses/Orbits: No acute finding. Other: None. CT CERVICAL SPINE FINDINGS Alignment:  Degenerative straightening and reversal of the normal cervical lordosis. Skull base and vertebrae: No acute fracture. No primary bone lesion or focal pathologic process. Soft tissues and spinal canal: No prevertebral fluid or swelling. No visible canal hematoma. Disc levels: Severe disc degenerative disease of the mid to lower cervical spine. Upper chest: Negative. Other: None. IMPRESSION: 1. No acute intracranial pathology. 2. Unchanged caliber and configuration of the ventricles, with right parietal and right occipital approach intraventricular shunt catheters.  Periventricular white matter as well as encephalomalacia of the cerebellum and marked prominence of the fourth ventricle. 3. No fracture or static subluxation of the cervical spine. 4. Severe disc degenerative disease of the mid to lower cervical spine. Electronically Signed   By: Fredricka Jenny M.D.   On: 05/04/2023 13:43   DG Chest 2 View Result Date: 05/04/2023 CLINICAL DATA:  Patient slipped and fell while getting into bed. EXAM: CHEST - 2 VIEW COMPARISON:  None Available. FINDINGS: The lungs are clear without focal pneumonia, edema, pneumothorax or pleural effusion. The cardiopericardial silhouette is within normal limits for size. VP shunt tubing overlies the medial right chest. No acute bony abnormality. IMPRESSION: No active cardiopulmonary disease. Electronically Signed   By: Donnal Fusi M.D.   On: 05/04/2023 13:24    ------  Assessment:  73 y.o. male with past medical history significant for dementia who presents from facility after fall and was noticed to have blood-tinged urine and inability to urinate.  Urology was consulted given inability place a catheter.  I was able to successfully place a catheter over a sensor wire.  It the patient has significant phimosis, and likely has a hematoma to his glans penis.  He would benefit from a future circumcision.  Patient is in florid renal failure at this time, with creatinine of 3.88.  This is very much likely due to longstanding obstruction.  Upon placement of the Foley catheter, there was extremely dark urine that appeared uremic.  I have low suspicion that his urine will create significant clotting, and will defer CBI at this time.  Patient will need to be admitted to medicine for close intake and output monitoring in the setting of likely postobstructive diuresis.  He also need to keep his catheter for prolonged period of time  Update:  Pt remains HDS overnight Labs not yet drawn this AM High UOP, 2.9L recorded- now light pink and blood  tinged but thin Fluids replaced with 2L boluses in ED and mIVF @ 40 cc /hr  Recommendations: - continue foley catheter  - Recommend continued close observation for post obstructive diuresis in the setting of longstanding urinary retention -trend creatinine -We defer CBI at this time.  Nursing to irrigate the catheter as needed.  If CBI were needed, the patient's phimosis would likely need to be stretched with dilators over a sensor wire to be able to accommodate a larger bore catheter, there is extremely difficult to even place a 16 [place] tip -Follow-up urine culture, will treat empirically with antibiotics until culture results   Florrie Husky, MD Resident Physician [facility]  Thank you for this consult. Please contact the urology consult pager with any further questions/concerns.

## 2023-05-05 NOTE — Progress Notes (Signed)
 Called [phone] / Kealoha Kurtzman to ask admission questions. Left voicemail.

## 2023-05-05 NOTE — ED Notes (Signed)
 [facility] nurse dede called for update [phone]

## 2023-05-05 NOTE — Evaluation (Signed)
 Occupational Therapy Evaluation Patient Details Name: Kurt Walton MRN: [id number] DOB: 08/15/1950 Today's Date: 05/05/2023   History of Present Illness Patient is a 74 year old male who presented after fall from wheelchair hitting head at SNF. Currently, patient was found to have obstructive uropathy, suspected UTI, PMH: vascular dementia, HTN, hyperlipidemia, left side hemi, cerebral aneurysm, L ankle ORIF, ventricular shunt.   Clinical Impression   Patient is a 73 year old male who was admitted for above. Patient was living at [facility] for LTC with caregiver support for ADLs. Patient was noted to have decreased functional activity tolernace, decreased ROM, decreased BUE strength, decreased endurance, decreased sitting balance, decreased standing balanced, decreased safety awareness, and decreased knowledge of AE/AD impacting participation in ADLs. Patient would continue to benefit from skilled OT services at this time while admitted and after d/c to address noted deficits in order to improve overall safety and independence in ADLs. Plan is for patient to return to LTC SNF when medically stable.        If plan is discharge home, recommend the following: Two people to help with walking and/or transfers;A lot of help with bathing/dressing/bathroom;Assistance with cooking/housework;Direct supervision/assist for medications management;Assist for transportation;Help with stairs or ramp for entrance;Direct supervision/assist for financial management    Functional Status Assessment  Patient has had a recent decline in their functional status and demonstrates the ability to make significant improvements in function in a reasonable and predictable amount of time.  Equipment Recommendations  None recommended by OT       Precautions / Restrictions Precautions Precautions: Fall Restrictions Weight Bearing Restrictions Per Provider Order: No      Mobility Bed Mobility Overal bed mobility:  Needs Assistance Bed Mobility: Sit to Supine       Sit to supine: Max assist, +2 for physical assistance, +2 for safety/equipment                   Balance Overall balance assessment: Needs assistance Sitting-balance support: Single extremity supported Sitting balance-Leahy Scale: Fair               ADL either performed or assessed with clinical judgement   ADL Overall ADL's : Needs assistance/impaired Eating/Feeding: Set up;Sitting Eating/Feeding Details (indicate cue type and reason): in chair in bed. A for cutting meal. tripod hold on fork with no built up handle. provided with red foam no change ataxic movements at times Grooming: Sitting;Minimal assistance   Upper Body Bathing: Sitting;Maximal assistance   Lower Body Bathing: Bed level;Maximal assistance   Upper Body Dressing : Sitting;Maximal assistance   Lower Body Dressing: Bed level;Total assistance   Toilet Transfer: +2 for safety/equipment;+2 for physical assistance;Maximal assistance;Squat-pivot;BSC/3in1 Toilet Transfer Details (indicate cue type and reason): back to bed Toileting- Clothing Manipulation and Hygiene: Total assistance;Sitting/lateral lean                Pertinent Vitals/Pain Pain Assessment Pain Assessment: No/denies pain     Extremity/Trunk Assessment     Lower Extremity Assessment Lower Extremity Assessment: LLE deficits/detail LLE Deficits / Details: noted edema of lower leg and ankle, equinovarus psoitioning, limited actrive movement, No pain   indicated with PROM.   Cervical / Trunk Assessment Cervical / Trunk Assessment: Normal   Communication Communication Communication: Difficulty communicating thoughts/reduced clarity of speech;Hearing impairment   Cognition Arousal: Alert Behavior During Therapy: Flat affect Overall Cognitive Status: Difficult to assess           General Comments: patient HOH at times.  patient very hapy therapy was present. patient kept  repeating " these college age kids keep me locked up all the time" during session. patient unable to expand upon this statement.                Home Living Family/patient expects to be discharged to:: Skilled nursing facility                  Prior Functioning/Environment Prior Level of Function : Needs assist             Mobility Comments: wheelchair bound at SNF. ADLs Comments: indicated being able to feed himself. confused not able to provide much of PLOF.        OT Problem List: Impaired balance (sitting and/or standing);Decreased knowledge of precautions;Cardiopulmonary status limiting activity;Decreased knowledge of use of DME or AE;Decreased activity tolerance;Decreased safety awareness      OT Treatment/Interventions: Self-care/ADL training;DME and/or AE instruction;Balance training;Therapeutic activities;Energy conservation;Patient/family education;Neuromuscular education;Therapeutic exercise    OT Goals(Current goals can be found in the care plan section) Acute Rehab OT Goals Patient Stated Goal: to get lunch OT Goal Formulation: Patient unable to participate in goal setting Time For Goal Achievement: 05/19/23 Potential to Achieve Goals: Fair  OT Frequency: Min 1X/week    Co-evaluation PT/OT/SLP Co-Evaluation/Treatment: Yes Reason for Co-Treatment: Necessary to address cognition/behavior during functional activity;To address functional/ADL transfers PT goals addressed during session: Mobility/safety with mobility OT goals addressed during session: ADL's and self-care      AM-PAC OT "6 Clicks" Daily Activity     Outcome Measure Help from another person eating meals?: A Little Help from another person taking care of personal grooming?: A Lot Help from another person toileting, which includes using toliet, bedpan, or urinal?: Total Help from another person bathing (including washing, rinsing, drying)?: Total Help from another person to put on and taking  off regular upper body clothing?: A Lot Help from another person to put on and taking off regular lower body clothing?: Total 6 Click Score: 10   End of Session Equipment Utilized During Treatment: Other (comment) (BSC)  Activity Tolerance: Patient tolerated treatment well Patient left: in bed;with call bell/phone within reach;with bed alarm set  OT Visit Diagnosis: Unsteadiness on feet (R26.81);Other abnormalities of gait and mobility (R26.89)                Time: 1211-1227 OT Time Calculation (min): 16 min Charges:  OT General Charges $OT Visit: 1 Visit OT Evaluation $OT Eval Low Complexity: 1 Low  Tavi Hoogendoorn OTR/L, MS Acute Rehabilitation Department Office# [phone]   Jame Maze 05/05/2023, 1:11 PM

## 2023-05-05 NOTE — ED Notes (Signed)
 ED TO INPATIENT HANDOFF REPORT  ED Nurse Name and Phone #: Clemetine Cypher Name/Age/Gender Kurt Walton 73 y.o. male Room/Bed: WHALD/WHALD  Code Status   Code Status: Full Code  Home/SNF/Other Nursing Home Patient oriented to: self and situation Is this baseline? Yes   Triage Complete: Triage complete  Chief Complaint AKI (acute kidney injury) (HCC) [N17.9]  Triage Note Pt BIB EMS from [facility] due to mechanical fall. Pt fell try to get into bed; slipped and hit forehead. Laceration to forehead bleeding controlled. No LOC, pt takes baby aspirin . Hx of Left sided paralysis; vascular dementia. Pt is hard of hearing.   Allergies Allergies  Allergen Reactions   Tuberculin Purified Protein Derivative Other (See Comments)    "Allergic," per MAR   Tuberculin Tests Other (See Comments)    "Allergic," per MAR    Level of Care/Admitting Diagnosis ED Disposition     ED Disposition  Admit   Condition  --   Comment  Hospital Area: [facility] [[id number]]  Level of Care: Med-Surg [16]  May admit patient to Arlin Benes or Maryan Smalling if equivalent level of care is available:: No  Covid Evaluation: Asymptomatic - no recent exposure (last 10 days) testing not required  Diagnosis: AKI (acute kidney injury) [facility]) [[id number]]  Admitting Physician: Davida Espy [[id number]]  Attending Physician: Davida Espy [[id number]]  Certification:: I certify this patient will need inpatient services for at least 2 midnights  Expected Medical Readiness: 05/07/2023          B Medical/Surgery History Past Medical History:  Diagnosis Date   Abnormal gait    Cerebral aneurysm    age 36 with VP shunt   Hard of hearing    Hx of adenomatous colonic polyps 01/29/2016   Hypertension    Substance abuse (HCC)    ETOH, past   Urinary incontinence    Past Surgical History:  Procedure Laterality Date   HIP SURGERY     ORIF ANKLE FRACTURE Left 01/04/2021   Procedure: OPEN REDUCTION  INTERNAL FIXATION (ORIF) ANKLE FRACTURE;  Surgeon: Donnamarie Gables, MD;  Location: [facility] OR;  Service: Orthopedics;  Laterality: Left;  LENGHT OF SURGERY: 90 MINUTES   TUMOR REMOVAL     not tumor, but aneurysm   VENTRICULOPERITONEAL SHUNT  10/17/2011   Procedure: SHUNT INSERTION VENTRICULAR-PERITONEAL;  Surgeon: Elder Greening, MD;  Location: [facility];  Service: Neurosurgery;  Laterality: Right;  Instertion of Ventricular-Peritoneal Shunt   VENTRICULOPERITONEAL SHUNT     once at age 76 and replaced 2013.     A IV Location/Drains/Wounds Patient Lines/Drains/Airways Status     Active Line/Drains/Airways     Name Placement date Placement time Site Days   Peripheral IV 05/04/23 20 G Right Antecubital 05/04/23  1659  Antecubital  1   Incision 10/17/11 Head Right 10/17/11  1224  -- 4218   Incision 10/17/11 Abdomen Right 10/17/11  1500  -- 4218   Incision (Closed) 01/04/21 Ankle Left 01/04/21  1621  -- 851   Wound 10/17/11 Abrasion(s) Knee Left 10/17/11  1500  Knee  4218            Intake/Output Last 24 hours  Intake/Output Summary (Last 24 hours) at 05/05/2023 0820 Last data filed at 05/05/2023 3086 Gross per 24 hour  Intake --  Output 2950 ml  Net -2950 ml    Labs/Imaging Results for orders placed or performed during the hospital encounter of 05/04/23 (from the past 48 hours)  CBC with Differential     Status: Abnormal   Collection Time: 05/04/23  4:57 PM  Result Value Ref Range   WBC 12.1 (H) 4.0 - 10.5 K/uL   RBC 4.05 (L) 4.22 - 5.81 MIL/uL   Hemoglobin 11.1 (L) 13.0 - 17.0 g/dL   HCT 81.1 (L) 91.4 - 78.2 %   MCV 85.4 80.0 - 100.0 fL   MCH 27.4 26.0 - 34.0 pg   MCHC 32.1 30.0 - 36.0 g/dL   RDW 95.6 21.3 - 08.6 %   Platelets 175 150 - 400 K/uL   nRBC 0.0 0.0 - 0.2 %   Neutrophils Relative % 89 %   Neutro Abs 10.8 (H) 1.7 - 7.7 K/uL   Lymphocytes Relative 3 %   Lymphs Abs 0.4 (L) 0.7 - 4.0 K/uL   Monocytes Relative 7 %   Monocytes Absolute 0.8 0.1 - 1.0 K/uL    Eosinophils Relative 0 %   Eosinophils Absolute 0.0 0.0 - 0.5 K/uL   Basophils Relative 0 %   Basophils Absolute 0.0 0.0 - 0.1 K/uL   Immature Granulocytes 1 %   Abs Immature Granulocytes 0.09 (H) 0.00 - 0.07 K/uL    Comment: Performed at [facility] [address] [place], [place] [address]  Comprehensive metabolic panel     Status: Abnormal   Collection Time: 05/04/23  4:57 PM  Result Value Ref Range   Sodium 145 135 - 145 mmol/L   Potassium 4.0 3.5 - 5.1 mmol/L   Chloride 111 98 - 111 mmol/L   CO2 22 22 - 32 mmol/L   Glucose, Bld 105 (H) 70 - 99 mg/dL    Comment: Glucose reference range applies only to samples taken after fasting for at least 8 hours.   BUN 64 (H) 8 - 23 mg/dL   Creatinine, Ser 9.62 (H) 0.61 - 1.24 mg/dL   Calcium 9.0 8.9 - 95.2 mg/dL   Total Protein 6.5 6.5 - 8.1 g/dL   Albumin  3.5 3.5 - 5.0 g/dL   AST 37 15 - 41 U/L   ALT 32 0 - 44 U/L   Alkaline Phosphatase 50 38 - 126 U/L   Total Bilirubin 0.8 0.0 - 1.2 mg/dL   GFR, Estimated 16 (L) >60 mL/min    Comment: (NOTE) Calculated using the CKD-EPI Creatinine Equation (2021)    Anion gap 12 5 - 15    Comment: Performed at [facility], [address], [place], [place] [address]  Urinalysis, Routine w reflex microscopic -Urine, Clean Catch     Status: Abnormal   Collection Time: 05/04/23  8:33 PM  Result Value Ref Range   Color, Urine BROWN (A) YELLOW    Comment: BIOCHEMICALS MAY BE AFFECTED BY COLOR   APPearance TURBID (A) CLEAR   Specific Gravity, Urine  1.005 - 1.030    TEST NOT REPORTED DUE TO COLOR INTERFERENCE OF URINE PIGMENT   pH  5.0 - 8.0    TEST NOT REPORTED DUE TO COLOR INTERFERENCE OF URINE PIGMENT   Glucose, UA (A) NEGATIVE mg/dL    TEST NOT REPORTED DUE TO COLOR INTERFERENCE OF URINE PIGMENT   Hgb urine dipstick (A) NEGATIVE    TEST NOT REPORTED DUE TO COLOR INTERFERENCE OF URINE PIGMENT   Bilirubin Urine (A) NEGATIVE    TEST NOT REPORTED DUE TO  COLOR INTERFERENCE OF URINE PIGMENT   Ketones, ur (A) NEGATIVE mg/dL    TEST NOT REPORTED DUE TO COLOR INTERFERENCE OF URINE PIGMENT  Protein, ur (A) NEGATIVE mg/dL    TEST NOT REPORTED DUE TO COLOR INTERFERENCE OF URINE PIGMENT   Nitrite (A) NEGATIVE    TEST NOT REPORTED DUE TO COLOR INTERFERENCE OF URINE PIGMENT   Leukocytes,Ua (A) NEGATIVE    TEST NOT REPORTED DUE TO COLOR INTERFERENCE OF URINE PIGMENT    Comment: Performed at [facility], [address], [place], [place] [address]  Urinalysis, Microscopic (reflex)     Status: Abnormal   Collection Time: 05/04/23  8:33 PM  Result Value Ref Range   RBC / HPF 11-20 0 - 5 RBC/hpf   WBC, UA 11-20 0 - 5 WBC/hpf   Bacteria, UA FEW (A) NONE SEEN   Squamous Epithelial / HPF 0-5 0 - 5 /HPF    Comment: Performed at [facility], [address], [place], [place] [address]   CT ABDOMEN PELVIS WO CONTRAST Result Date: 05/04/2023 CLINICAL DATA:  [name], head trauma, abdominal pain EXAM: CT ABDOMEN AND PELVIS WITHOUT CONTRAST TECHNIQUE: Multidetector CT imaging of the abdomen and pelvis was performed following the standard protocol without IV contrast. Unenhanced CT was performed per clinician order. Lack of IV contrast limits sensitivity and specificity, especially for evaluation of abdominal/pelvic solid viscera and vascular structures in the setting of trauma. RADIATION DOSE REDUCTION: This exam was performed according to the departmental dose-optimization program which includes automated exposure control, adjustment of the mA and/or kV according to patient size and/or use of iterative reconstruction technique. COMPARISON:  None Available. FINDINGS: Lower chest: No acute pleural or parenchymal lung disease. Hepatobiliary: Scattered hepatic hypodensities are identified, most compatible with cysts. No evidence of acute liver abnormality on this limited unenhanced exam. The gallbladder is unremarkable. Pancreas:  Unremarkable unenhanced appearance. Spleen: Unremarkable unenhanced appearance. Adrenals/Urinary Tract: There is diffuse bladder wall thickening, with trabeculations and marked distension consistent with chronic bladder outlet obstruction. There is bilateral hydroureteronephrosis, likely due to bladder distension. No urinary tract calculi. No evidence of renal injury on this unenhanced exam. The adrenals are unremarkable. Stomach/Bowel: No bowel obstruction or ileus. Normal appendix right lower quadrant. Scattered colonic diverticulosis without evidence of acute diverticulitis. No bowel wall thickening or inflammatory change. Vascular/Lymphatic: Aortic atherosclerosis. No enlarged abdominal or pelvic lymph nodes. Reproductive: Prostate is enlarged, measuring 5.8 x 6.4 x 5.6 cm. Other: No free fluid or free intraperitoneal gas. No abdominal wall hernia. Ventriculostomy catheter enters the abdomen in the right upper quadrant, distal tip within the left lower quadrant. No evidence of catheter discontinuity. Musculoskeletal: No acute or destructive bony abnormalities. Prior ORIF right femur. Chronic L1 compression deformity. Reconstructed images demonstrate no additional findings. IMPRESSION: 1. No acute intra-abdominal or intrapelvic trauma on this unenhanced exam. 2. 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 3. Bilateral hydroureteronephrosis, likely due to bladder distension and chronic bladder outlet obstruction. No urinary tract calculi. 4. Colonic diverticulosis without diverticulitis. 5.  Aortic Atherosclerosis (ICD10-I70.0). Electronically Signed   By: Bobbye Burrow M.D.   On: 05/04/2023 19:13   CT Head Wo Contrast Result Date: 05/04/2023 CLINICAL DATA:  Head and neck trauma EXAM: CT HEAD WITHOUT CONTRAST CT CERVICAL SPINE WITHOUT CONTRAST TECHNIQUE: Multidetector CT imaging of the head and cervical spine was performed following  the standard protocol without intravenous contrast. Multiplanar CT image reconstructions of the cervical spine were also generated. RADIATION DOSE REDUCTION: This exam was performed according to the departmental dose-optimization program which includes automated exposure control, adjustment of the mA  and/or kV according to patient size and/or use of iterative reconstruction technique. COMPARISON:  01/13/2017 FINDINGS: CT HEAD FINDINGS Brain: No evidence of acute infarction, hemorrhage, extra-axial collection or mass lesion/mass effect. Unchanged caliber and configuration of the ventricles, with right parietal and right occipital approach intraventricular shunt catheters. Periventricular white matter as well as encephalomalacia of the cerebellum and marked prominence of the fourth ventricle. Vascular: No hyperdense vessel or unexpected calcification. Skull: Normal. Negative for fracture or focal lesion. Sinuses/Orbits: No acute finding. Other: None. CT CERVICAL SPINE FINDINGS Alignment: Degenerative straightening and reversal of the normal cervical lordosis. Skull base and vertebrae: No acute fracture. No primary bone lesion or focal pathologic process. Soft tissues and spinal canal: No prevertebral fluid or swelling. No visible canal hematoma. Disc levels: Severe disc degenerative disease of the mid to lower cervical spine. Upper chest: Negative. Other: None. IMPRESSION: 1. No acute intracranial pathology. 2. Unchanged caliber and configuration of the ventricles, with right parietal and right occipital approach intraventricular shunt catheters. Periventricular white matter as well as encephalomalacia of the cerebellum and marked prominence of the fourth ventricle. 3. No fracture or static subluxation of the cervical spine. 4. Severe disc degenerative disease of the mid to lower cervical spine. Electronically Signed   By: Fredricka Jenny M.D.   On: 05/04/2023 13:43   CT Cervical Spine Wo Contrast Result Date:  05/04/2023 CLINICAL DATA:  Head and neck trauma EXAM: CT HEAD WITHOUT CONTRAST CT CERVICAL SPINE WITHOUT CONTRAST TECHNIQUE: Multidetector CT imaging of the head and cervical spine was performed following the standard protocol without intravenous contrast. Multiplanar CT image reconstructions of the cervical spine were also generated. RADIATION DOSE REDUCTION: This exam was performed according to the departmental dose-optimization program which includes automated exposure control, adjustment of the mA and/or kV according to patient size and/or use of iterative reconstruction technique. COMPARISON:  01/13/2017 FINDINGS: CT HEAD FINDINGS Brain: No evidence of acute infarction, hemorrhage, extra-axial collection or mass lesion/mass effect. Unchanged caliber and configuration of the ventricles, with right parietal and right occipital approach intraventricular shunt catheters. Periventricular white matter as well as encephalomalacia of the cerebellum and marked prominence of the fourth ventricle. Vascular: No hyperdense vessel or unexpected calcification. Skull: Normal. Negative for fracture or focal lesion. Sinuses/Orbits: No acute finding. Other: None. CT CERVICAL SPINE FINDINGS Alignment: Degenerative straightening and reversal of the normal cervical lordosis. Skull base and vertebrae: No acute fracture. No primary bone lesion or focal pathologic process. Soft tissues and spinal canal: No prevertebral fluid or swelling. No visible canal hematoma. Disc levels: Severe disc degenerative disease of the mid to lower cervical spine. Upper chest: Negative. Other: None. IMPRESSION: 1. No acute intracranial pathology. 2. Unchanged caliber and configuration of the ventricles, with right parietal and right occipital approach intraventricular shunt catheters. Periventricular white matter as well as encephalomalacia of the cerebellum and marked prominence of the fourth ventricle. 3. No fracture or static subluxation of the cervical  spine. 4. Severe disc degenerative disease of the mid to lower cervical spine. Electronically Signed   By: Fredricka Jenny M.D.   On: 05/04/2023 13:43   DG Chest 2 View Result Date: 05/04/2023 CLINICAL DATA:  Patient slipped and fell while getting into bed. EXAM: CHEST - 2 VIEW COMPARISON:  None Available. FINDINGS: The lungs are clear without focal pneumonia, edema, pneumothorax or pleural effusion. The cardiopericardial silhouette is within normal limits for size. VP shunt tubing overlies the medial right chest. No acute bony abnormality. IMPRESSION: No active cardiopulmonary disease. Electronically  Signed   By: Donnal Fusi M.D.   On: 05/04/2023 13:24    Pending Labs Unresulted Labs (From admission, onward)     Start     Ordered   05/11/23 0500  Creatinine, serum  (enoxaparin  (LOVENOX )    CrCl >/= 30 ml/min)  Weekly,   R     Comments: while on enoxaparin  therapy    05/04/23 2140   05/05/23 0500  Comprehensive metabolic panel  Tomorrow morning,   R        05/04/23 2140   05/05/23 0500  CBC  Tomorrow morning,   R        05/04/23 2140   05/04/23 2141  CBC  (enoxaparin  (LOVENOX )    CrCl >/= 30 ml/min)  Once,   R       Comments: Baseline for enoxaparin  therapy IF NOT ALREADY DRAWN.  Notify MD if PLT < 100 K.    05/04/23 2140   05/04/23 2141  Creatinine, serum  (enoxaparin  (LOVENOX )    CrCl >/= 30 ml/min)  Once,   R       Comments: Baseline for enoxaparin  therapy IF NOT ALREADY DRAWN.    05/04/23 2140   05/04/23 2022  Urine Culture  Once,   URGENT       Question:  Indication  Answer:  Dysuria   05/04/23 2021            Vitals/Pain Today's Vitals   05/04/23 1124 05/04/23 1637 05/05/23 0018 05/05/23 0516  BP: 124/74 99/61 (!) 122/90 (!) 122/58  Pulse: 88 96 85 81  Resp: 18 17 16 16   Temp: 98.3 F (36.8 C) 99.7 F (37.6 C) 98.1 F (36.7 C) 98.2 F (36.8 C)  TempSrc:  Axillary Oral Oral  SpO2: 100% 98% 100% 99%    Isolation Precautions No active  isolations  Medications Medications  enoxaparin  (LOVENOX ) injection 30 mg (has no administration in time range)  dextrose  5 % in lactated ringers  infusion ( Intravenous New Bag/Given 05/05/23 0050)  morphine  (PF) 2 MG/ML injection 2 mg (has no administration in time range)  ondansetron  (ZOFRAN ) tablet 4 mg (has no administration in time range)    Or  ondansetron  (ZOFRAN ) injection 4 mg (has no administration in time range)  cefTRIAXone  (ROCEPHIN ) 1 g in sodium chloride  0.9 % 100 mL IVPB (has no administration in time range)  sodium chloride  0.9 % bolus 1,000 mL (0 mLs Intravenous Stopped 05/04/23 1909)  lidocaine  (XYLOCAINE ) 2 % jelly 1 Application (1 Application Urethral Given 05/04/23 2000)  cefTRIAXone  (ROCEPHIN ) 2 g in sodium chloride  0.9 % 100 mL IVPB (0 g Intravenous Stopped 05/04/23 2236)  sodium chloride  0.9 % bolus 1,000 mL (0 mLs Intravenous Stopped 05/04/23 2350)    Mobility non-ambulatory     Focused Assessments AKI; Hematuria   R Recommendations: See Admitting Provider Note  Report given to:   Additional Notes: .

## 2023-05-05 NOTE — Evaluation (Signed)
 Physical Therapy Evaluation Patient Details Name: Kurt Walton MRN: [id number] DOB: [birth year] Today's Date: 05/05/2023  History of Present Illness  Patient is a 73 year old male who presented after fall from wheelchair hitting head at SNF. Currently, patient was found to have obstructive uropathy, suspected UTI, PMH: vascular dementia, HTN, hyperlipidemia, left side hemi, cerebral aneurysm, L ankle ORIF, ventricular shunt.  Clinical Impression  Pt admitted with above diagnosis.  Pt currently with functional limitations due to the deficits listed below (see PT Problem List). Pt will benefit from acute skilled PT to increase their independence and safety with mobility to allow discharge.     The patient  reporting Left ankle pain, foley catheter discomfort. Patient tolerated PROM of the Left ankle and  WB during transfer. Patient  assisted with transfer to [facility]  and back to bed.  Patient uses a WC for mobility at baseline at skilled facility. Patient should be able to return to the facility , recommend  PT reevaluate for baseline function at the facility if possible.      If plan is discharge home, recommend the following: A lot of help with bathing/dressing/bathroom;Help with stairs or ramp for entrance;A little help with walking and/or transfers   Can travel by private vehicle        Equipment Recommendations None recommended by PT  Recommendations for Other Services       Functional Status Assessment Patient has had a recent decline in their functional status and/or demonstrates limited ability to make significant improvements in function in a reasonable and predictable amount of time     Precautions / Restrictions Precautions Precautions: Fall Restrictions Weight Bearing Restrictions Per Provider Order: No      Mobility  Bed Mobility Overal bed mobility: Needs Assistance Bed Mobility: Supine to Sit, Sit to Supine     Supine to sit: Mod assist Sit to supine: Max assist, +2  for physical assistance, +2 for safety/equipment   General bed mobility comments: min assist with  legs to bed  edge, assisted with trunk, patient using bed rail to pull self around. Assisted legs and trunk to return to supine    Transfers Overall transfer level: Needs assistance Equipment used: 1 person hand held assist Transfers: Sit to/from Stand, Bed to chair/wheelchair/BSC       Squat pivot transfers: Mod assist,      General transfer comment: mod support to  right side, able to reach to [facility] and pivot with mod support. More assistance to pivot back to bed leading with the left side(hemi side)    Ambulation/Gait                  Stairs            Wheelchair Mobility     Tilt Bed    Modified Rankin (Stroke Patients Only)       Balance Overall balance assessment: Needs assistance Sitting-balance support: Single extremity supported Sitting balance-Leahy Scale: Fair       Standing balance-Leahy Scale: Poor Standing balance comment: support required  when stading more upright                             Pertinent Vitals/Pain Pain Assessment Pain Assessment: Faces Faces Pain Scale: Hurts whole lot Pain Location: penis and left ankle Pain Descriptors / Indicators: Aching, Discomfort, Grimacing, Guarding Pain Intervention(s): Limited activity within patient's tolerance, Monitored during session, Premedicated before session, Repositioned  Home Living Family/patient expects to be discharged to:: Skilled nursing facility                        Prior Function Prior Level of Function : Needs assist             Mobility Comments: wheelchair bound at SNF. ADLs Comments: indicated being able to feed himself. confused not able to provide much of PLOF.     Extremity/Trunk Assessment        Lower Extremity Assessment Lower Extremity Assessment: LLE deficits/detail LLE Deficits / Details: noted edema of lower leg and ankle,  equinovarus psoitioning, limited actrive movement, No pain   indicated with PROM.    Cervical / Trunk Assessment Cervical / Trunk Assessment: Normal  Communication   Communication Communication: Difficulty communicating thoughts/reduced clarity of speech;Hearing impairment  Cognition Arousal: Alert   Overall Cognitive Status: Difficult to assess                                 General Comments: at times does not answer, repeating that he wants to leave., he is hungry and is in pain        General Comments      Exercises     Assessment/Plan    PT Assessment Patient needs continued PT services  PT Problem List Decreased strength;Decreased balance;Decreased cognition;Decreased knowledge of precautions;Decreased range of motion;Decreased mobility;Pain;Decreased activity tolerance;Decreased safety awareness       PT Treatment Interventions DME instruction;Therapeutic activities;Cognitive remediation;Therapeutic exercise;Patient/family education;Functional mobility training;Neuromuscular re-education    PT Goals (Current goals can be found in the Care Plan section)  Acute Rehab PT Goals Patient Stated Goal: use  BR, eat PT Goal Formulation: With patient Time For Goal Achievement: 05/19/23 Potential to Achieve Goals: Fair    Frequency Min 1X/week     Co-evaluation   Reason for Co-Treatment: Necessary to address cognition/behavior during functional activity;To address functional/ADL transfers PT goals addressed during session: Mobility/safety with mobility OT goals addressed during session: ADL's and self-care       AM-PAC PT "6 Clicks" Mobility  Outcome Measure Help needed turning from your back to your side while in a flat bed without using bedrails?: A Lot Help needed moving from lying on your back to sitting on the side of a flat bed without using bedrails?: A Lot Help needed moving to and from a bed to a chair (including a wheelchair)?: Total Help  needed standing up from a chair using your arms (e.g., wheelchair or bedside chair)?: Total Help needed to walk in hospital room?: Total Help needed climbing 3-5 steps with a railing? : Total 6 Click Score: 8    End of Session Equipment Utilized During Treatment: Gait belt Activity Tolerance: Patient tolerated treatment well Patient left: in bed;with bed alarm set Nurse Communication: Mobility status PT Visit Diagnosis: Unsteadiness on feet (R26.81);Muscle weakness (generalized) (M62.81);Repeated falls (R29.6)    Time: 8119-1478 PT Time Calculation (min) (ACUTE ONLY): 41 min   Charges:   PT Evaluation $PT Eval Low Complexity: 1 Low PT Treatments $Therapeutic Activity: 8-22 mins PT General Charges $$ ACUTE PT VISIT: 1 Visit         Abelina Hoes PT Acute Rehabilitation Services Office [phone] Weekend pager-[phone]   Dareen Ebbing 05/05/2023, 1:01 PM

## 2023-05-05 NOTE — Progress Notes (Signed)
 Spoke to [place] SW [phone].   [facility]  [phone] Legal [facility] Department of Social Services. Kristian Petty [phone]  Leary Provencal mention patient did fall recently at facility Jan 31/Feb2. Patient able to take pill. Needs set up with ADLs.  No current wounds.   Patient PT eval completed on 2/6- Stand by Assist and has poor safety awareness.    Patient does use Left AFO that is currently needs to be repaired and is at facility.

## 2023-05-06 DIAGNOSIS — N179 Acute kidney failure, unspecified: Secondary | ICD-10-CM | POA: Diagnosis not present

## 2023-05-06 LAB — BASIC METABOLIC PANEL
Anion gap: 7 (ref 5–15)
BUN: 36 mg/dL — ABNORMAL HIGH (ref 8–23)
CO2: 23 mmol/L (ref 22–32)
Calcium: 8.1 mg/dL — ABNORMAL LOW (ref 8.9–10.3)
Chloride: 110 mmol/L (ref 98–111)
Creatinine, Ser: 1.36 mg/dL — ABNORMAL HIGH (ref 0.61–1.24)
GFR, Estimated: 55 mL/min — ABNORMAL LOW (ref >60)
Glucose, Bld: 90 mg/dL (ref 70–99)
Potassium: 3.6 mmol/L (ref 3.5–5.1)
Sodium: 140 mmol/L (ref 135–145)

## 2023-05-06 LAB — CBC
HCT: 33.4 % — ABNORMAL LOW (ref 39.0–52.0)
Hemoglobin: 10.7 g/dL — ABNORMAL LOW (ref 13.0–17.0)
MCH: 27.6 pg (ref 26.0–34.0)
MCHC: 32 g/dL (ref 30.0–36.0)
MCV: 86.1 fL (ref 80.0–100.0)
Platelets: 145 10*3/uL — ABNORMAL LOW (ref 150–400)
RBC: 3.88 MIL/uL — ABNORMAL LOW (ref 4.22–5.81)
RDW: 14.6 % (ref 11.5–15.5)
WBC: 9.5 10*3/uL (ref 4.0–10.5)
nRBC: 0 % (ref 0.0–0.2)

## 2023-05-06 MED ORDER — PRAVASTATIN SODIUM 40 MG PO TABS
80.0000 mg | ORAL_TABLET | Freq: Every day | ORAL | Status: DC
  Administered 2023-05-06: 80 mg via ORAL
  Filled 2023-05-06: qty 2

## 2023-05-06 MED ORDER — TRAZODONE HCL 50 MG PO TABS
25.0000 mg | ORAL_TABLET | Freq: Every day | ORAL | Status: DC
  Administered 2023-05-06: 25 mg via ORAL
  Filled 2023-05-06: qty 1

## 2023-05-06 MED ORDER — ENOXAPARIN SODIUM 40 MG/0.4ML IJ SOSY
40.0000 mg | PREFILLED_SYRINGE | Freq: Every day | INTRAMUSCULAR | Status: DC
  Administered 2023-05-06 – 2023-05-07 (×2): 40 mg via SUBCUTANEOUS
  Filled 2023-05-06 (×2): qty 0.4

## 2023-05-06 MED ORDER — SENNA 8.6 MG PO TABS
1.0000 | ORAL_TABLET | Freq: Every day | ORAL | Status: DC
  Administered 2023-05-06: 8.6 mg via ORAL
  Filled 2023-05-06: qty 1

## 2023-05-06 MED ORDER — CHLORHEXIDINE GLUCONATE CLOTH 2 % EX PADS
6.0000 | MEDICATED_PAD | Freq: Every day | CUTANEOUS | Status: DC
  Administered 2023-05-06 – 2023-05-07 (×2): 6 via TOPICAL

## 2023-05-06 NOTE — Progress Notes (Signed)
PROGRESS NOTE  Kurt Walton  ZOX:[id number] DOB: 10/02/1950 DOA: 05/04/2023 PCP: Jethro Bastos, MD   Brief Narrative: Patient is a 3 male with history of dementia, hypertension, previous history of alcohol use/polysubstance abuse, CVA with left residual hemiparesis who was brought from [facility] from rehab after he fell.  He was trying to get up into bed from his wheelchair, fell and struck his forehead. No report of loss of consciousness.  On presentation, he was confused.  Found to have massive bladder distention and also severe phimosis with obstructive uropathy and bilateral hydronephrosis.  Urology consulted.  Foley placed.  Lab work showed AKI with creatinine in the range of 3, now kidney function significantly better.  Started on antibiotics for possible UTI as well.  Urine culture showing providentia, sensitivity pending(microbiology lab said it will be resulting only tomorrow).  Urology recommend outpatient follow-up.  Plan for discharge tomorrow back to SNF.  TOC consulted  Assessment & Plan:  Principal Problem:   AKI (acute kidney injury) (HCC) Active Problems:   HTN (hypertension)   Obstructed, uropathy   Obstructive uropathy: Found to have severe bladder distention, phimosis.  Urology consulted.  CT imaging showed enlarged prostate with diffuse bladder wall thickening, trabeculation, bilateral hydroureteronephrosis.  Foley has been placed.  Continue Flomax.  Urine is brownish today.  Urology will follow him as an outpatient and we will plan for surgery for phimosis.  Continue Foley catheter on discharge.  AKI: Likely from urine retention.  Continue Foley catheter.  Kidney function significantly better.  IV fluids discontinued  Suspected UTI: Currently on ceftriaxone.  Had mild leukocytosis on presentation.  Afebrile.  Urine culture will result only tomorrow.  Currently showing providentia.  Most likely we can change antibiotic to oral tomorrow.  Dementia: Currently mentation at  baseline.  Oriented to place only.  Continue to monitor.  Continue delirium precaution, frequent reorientation.  History of hyperlipidemia: Takes pravastatin   Debility/deconditioning/fall: Lives at nursing facility.  Uses wheelchair for ambulation.  PT/OT consulted.        DVT prophylaxis:     Code Status: Full Code  Family Communication: None at bedside  Patient status:Inpatient  Patient is from :SNF  Anticipated discharge to:SNF  Estimated DC date:tomorrow, waiting for urine culture report   Consultants: Urology  Procedures: Foley placement  Antimicrobials:  Anti-infectives (From admission, onward)    Start     Dose/Rate Route Frequency Ordered Stop   05/04/23 2145  cefTRIAXone (ROCEPHIN) 1 g in sodium chloride 0.9 % 100 mL IVPB        1 g 200 mL/hr over 30 Minutes Intravenous Every 24 hours 05/04/23 2140     05/04/23 2030  cefTRIAXone (ROCEPHIN) 2 g in sodium chloride 0.9 % 100 mL IVPB        2 g 200 mL/hr over 30 Minutes Intravenous  Once 05/04/23 2021 05/04/23 2236       Subjective: Patient seen and the bedside today.  Appeared comfortable, lying in bed, confused, oriented to place only.  Not in any current distress.  Not agitated.  Objective: Vitals:   05/05/23 1427 05/05/23 1654 05/05/23 2132 05/06/23 0510  BP: 90/63 (!) 99/59 (!) 91/54 (!) 103/52  Pulse: 89 84 81 82  Resp: 18 18 15 14   Temp: 98.4 F (36.9 C) 99 F (37.2 C) 99.5 F (37.5 C) 98.1 F (36.7 C)  TempSrc:      SpO2: 99% 98% 98% 95%    Intake/Output Summary (Last 24 hours) at 05/06/2023  1120 Last data filed at 05/06/2023 0900 Gross per 24 hour  Intake 360 ml  Output 2750 ml  Net -2390 ml   There were no vitals filed for this visit.  Examination:  General exam: Overall comfortable, not in distress, chronically deconditioned HEENT: PERRL Respiratory system:  no wheezes or crackles  Cardiovascular system: S1 & S2 heard, RRR.  Gastrointestinal system: Abdomen is nondistended,  soft and nontender. Central nervous system: Alert and awake, oriented to place, left hemiplegia with contracture of left upper extremity Extremities: No edema, no clubbing ,no cyanosis Skin: No rashes, no ulcers,no icterus  GU: Foley   Data Reviewed: I have personally reviewed following labs and imaging studies  CBC: Recent Labs  Lab 05/04/23 1657 05/05/23 1028 05/06/23 0446  WBC 12.1* 12.6* 9.5  NEUTROABS 10.8*  --   --   HGB 11.1* 11.3* 10.7*  HCT 34.6* 34.9* 33.4*  MCV 85.4 85.7 86.1  PLT 175 166 145*   Basic Metabolic Panel: Recent Labs  Lab 05/04/23 1657 05/05/23 1024 05/06/23 0446  NA 145 144 140  K 4.0 3.7 3.6  CL 111 112* 110  CO2 22 22 23   GLUCOSE 105* 98 90  BUN 64* 43* 36*  CREATININE 3.88* 1.90* 1.36*  CALCIUM 9.0 8.1* 8.1*     Recent Results (from the past 240 hours)  Urine Culture     Status: Abnormal (Preliminary result)   Collection Time: 05/04/23  8:33 PM   Specimen: Urine, Catheterized  Result Value Ref Range Status   Specimen Description   Final    URINE, CATHETERIZED Performed at [facility], [address], [place], [place] [address]    Special Requests   Final    NONE Performed at [facility], [address], [place], [place] [address]    Culture (A)  Final    >=100,000 COLONIES/mL PROVIDENCIA STUARTII SUSCEPTIBILITIES TO FOLLOW Performed at [facility], [address], [place], [place] [address]    Report Status PENDING  Incomplete     Radiology Studies: CT ABDOMEN PELVIS WO CONTRAST Result Date: 05/04/2023 CLINICAL DATA:  Larey Seat, head trauma, abdominal pain EXAM: CT ABDOMEN AND PELVIS WITHOUT CONTRAST TECHNIQUE: Multidetector CT imaging of the abdomen and pelvis was performed following the standard protocol without IV contrast. Unenhanced CT was performed per clinician order. Lack of IV contrast limits sensitivity and specificity, especially for evaluation of abdominal/pelvic solid  viscera and vascular structures in the setting of trauma. RADIATION DOSE REDUCTION: This exam was performed according to the departmental dose-optimization program which includes automated exposure control, adjustment of the mA and/or kV according to patient size and/or use of iterative reconstruction technique. COMPARISON:  None Available. FINDINGS: Lower chest: No acute pleural or parenchymal lung disease. Hepatobiliary: Scattered hepatic hypodensities are identified, most compatible with cysts. No evidence of acute liver abnormality on this limited unenhanced exam. The gallbladder is unremarkable. Pancreas: Unremarkable unenhanced appearance. Spleen: Unremarkable unenhanced appearance. Adrenals/Urinary Tract: There is diffuse bladder wall thickening, with trabeculations and marked distension consistent with chronic bladder outlet obstruction. There is bilateral hydroureteronephrosis, likely due to bladder distension. No urinary tract calculi. No evidence of renal injury on this unenhanced exam. The adrenals are unremarkable. Stomach/Bowel: No bowel obstruction or ileus. Normal appendix right lower quadrant. Scattered colonic diverticulosis without evidence of acute diverticulitis. No bowel wall thickening or inflammatory change. Vascular/Lymphatic: Aortic atherosclerosis. No enlarged abdominal or pelvic lymph nodes. Reproductive: Prostate is enlarged, measuring 5.8 x 6.4 x 5.6 cm. Other:  No free fluid or free intraperitoneal gas. No abdominal wall hernia. Ventriculostomy catheter enters the abdomen in the right upper quadrant, distal tip within the left lower quadrant. No evidence of catheter discontinuity. Musculoskeletal: No acute or destructive bony abnormalities. Prior ORIF right femur. Chronic L1 compression deformity. Reconstructed images demonstrate no additional findings. IMPRESSION: 1. No acute intra-abdominal or intrapelvic trauma on this unenhanced exam. 2. Chronic bladder outlet obstruction due to an  enlarged prostate, with diffuse bladder wall thickening and trabeculations. 3. Bilateral hydroureteronephrosis, likely due to bladder distension and chronic bladder outlet obstruction. No urinary tract calculi. 4. Colonic diverticulosis without diverticulitis. 5.  Aortic Atherosclerosis (ICD10-I70.0). Electronically Signed   By: Sharlet Salina M.D.   On: 05/04/2023 19:13   CT Head Wo Contrast Result Date: 05/04/2023 CLINICAL DATA:  Head and neck trauma EXAM: CT HEAD WITHOUT CONTRAST CT CERVICAL SPINE WITHOUT CONTRAST TECHNIQUE: Multidetector CT imaging of the head and cervical spine was performed following the standard protocol without intravenous contrast. Multiplanar CT image reconstructions of the cervical spine were also generated. RADIATION DOSE REDUCTION: This exam was performed according to the departmental dose-optimization program which includes automated exposure control, adjustment of the mA and/or kV according to patient size and/or use of iterative reconstruction technique. COMPARISON:  01/13/2017 FINDINGS: CT HEAD FINDINGS Brain: No evidence of acute infarction, hemorrhage, extra-axial collection or mass lesion/mass effect. Unchanged caliber and configuration of the ventricles, with right parietal and right occipital approach intraventricular shunt catheters. Periventricular white matter as well as encephalomalacia of the cerebellum and marked prominence of the fourth ventricle. Vascular: No hyperdense vessel or unexpected calcification. Skull: Normal. Negative for fracture or focal lesion. Sinuses/Orbits: No acute finding. Other: None. CT CERVICAL SPINE FINDINGS Alignment: Degenerative straightening and reversal of the normal cervical lordosis. Skull base and vertebrae: No acute fracture. No primary bone lesion or focal pathologic process. Soft tissues and spinal canal: No prevertebral fluid or swelling. No visible canal hematoma. Disc levels: Severe disc degenerative disease of the mid to lower  cervical spine. Upper chest: Negative. Other: None. IMPRESSION: 1. No acute intracranial pathology. 2. Unchanged caliber and configuration of the ventricles, with right parietal and right occipital approach intraventricular shunt catheters. Periventricular white matter as well as encephalomalacia of the cerebellum and marked prominence of the fourth ventricle. 3. No fracture or static subluxation of the cervical spine. 4. Severe disc degenerative disease of the mid to lower cervical spine. Electronically Signed   By: Jearld Lesch M.D.   On: 05/04/2023 13:43   CT Cervical Spine Wo Contrast Result Date: 05/04/2023 CLINICAL DATA:  Head and neck trauma EXAM: CT HEAD WITHOUT CONTRAST CT CERVICAL SPINE WITHOUT CONTRAST TECHNIQUE: Multidetector CT imaging of the head and cervical spine was performed following the standard protocol without intravenous contrast. Multiplanar CT image reconstructions of the cervical spine were also generated. RADIATION DOSE REDUCTION: This exam was performed according to the departmental dose-optimization program which includes automated exposure control, adjustment of the mA and/or kV according to patient size and/or use of iterative reconstruction technique. COMPARISON:  01/13/2017 FINDINGS: CT HEAD FINDINGS Brain: No evidence of acute infarction, hemorrhage, extra-axial collection or mass lesion/mass effect. Unchanged caliber and configuration of the ventricles, with right parietal and right occipital approach intraventricular shunt catheters. Periventricular white matter as well as encephalomalacia of the cerebellum and marked prominence of the fourth ventricle. Vascular: No hyperdense vessel or unexpected calcification. Skull: Normal. Negative for fracture or focal lesion. Sinuses/Orbits: No acute finding. Other: None. CT CERVICAL SPINE FINDINGS Alignment:  Degenerative straightening and reversal of the normal cervical lordosis. Skull base and vertebrae: No acute fracture. No primary  bone lesion or focal pathologic process. Soft tissues and spinal canal: No prevertebral fluid or swelling. No visible canal hematoma. Disc levels: Severe disc degenerative disease of the mid to lower cervical spine. Upper chest: Negative. Other: None. IMPRESSION: 1. No acute intracranial pathology. 2. Unchanged caliber and configuration of the ventricles, with right parietal and right occipital approach intraventricular shunt catheters. Periventricular white matter as well as encephalomalacia of the cerebellum and marked prominence of the fourth ventricle. 3. No fracture or static subluxation of the cervical spine. 4. Severe disc degenerative disease of the mid to lower cervical spine. Electronically Signed   By: Jearld Lesch M.D.   On: 05/04/2023 13:43   DG Chest 2 View Result Date: 05/04/2023 CLINICAL DATA:  Patient slipped and fell while getting into bed. EXAM: CHEST - 2 VIEW COMPARISON:  None Available. FINDINGS: The lungs are clear without focal pneumonia, edema, pneumothorax or pleural effusion. The cardiopericardial silhouette is within normal limits for size. VP shunt tubing overlies the medial right chest. No acute bony abnormality. IMPRESSION: No active cardiopulmonary disease. Electronically Signed   By: Kennith Center M.D.   On: 05/04/2023 13:24    Scheduled Meds:  tamsulosin  0.4 mg Oral Daily   Continuous Infusions:  sodium chloride 100 mL/hr at 05/06/23 0005   cefTRIAXone (ROCEPHIN)  IV 1 g (05/05/23 2018)     LOS: 2 days   Burnadette Pop, MD [facility] P2/01/2024, 11:20 AM

## 2023-05-06 NOTE — Progress Notes (Signed)
Patient's case worker, Vanessa Kick, RN with [facility], updated on patient's assessment, plan of care, and expected disposition plan.

## 2023-05-06 NOTE — TOC Progression Note (Addendum)
Transition of Care [facility]) - Progression Note    Patient Details  Name: Kurt Walton MRN: [id number] Date of Birth: 1950/09/22  Transition of Care [facility]) CM/SW Contact  Larrie Kass, LCSW Phone Number: 05/06/2023, 10:28 AM  Clinical Narrative:     CSW confirmed pt is a LTC resident with [facility] and can return at the end of hospitalization. TOC to follow   [place] 1200pm CSW received a message from the pt's LTC facility stating they cannot accept the pt back to the facility. Noted pt is a LTC resident with [facility].  CSW received a call from Marylu Lund, the case manager with [facility]. She stated that due to the short notice, the pt will have to return to [facility]. She reported that the facility did not provide a 30-day notice and has not given a written reason for wanting to discharge the pt. Marylu Lund reported that [facility] will provide transportation at the time of discharge and instructed to call [phone] and ask for transportation. Janet([phone]) provided her contact number in case there is additional needs. TOC to follow.  Expected Discharge Plan: Long Term Nursing Home Barriers to Discharge: Continued Medical Work up  Expected Discharge Plan and Services                                               Social Determinants of Health (SDOH) Interventions SDOH Screenings   Food Insecurity: No Food Insecurity (05/05/2023)  Housing: Unknown (05/05/2023)  Transportation Needs: No Transportation Needs (05/05/2023)  Utilities: Not At Risk (05/05/2023)  Social Connections: Patient Unable To Answer (05/05/2023)  Tobacco Use: Medium Risk (05/04/2023)    Readmission Risk Interventions     No data to display

## 2023-05-06 NOTE — Plan of Care (Signed)

## 2023-05-06 NOTE — Consult Note (Signed)
Urology Consult Note   Requesting Attending Physician:  Burnadette Pop, MD Service Providing Consult: Urology  Consulting Attending: Modena Slater  Reason for Consult:  urinary retention  HPI: Kurt Walton is seen in consultation for reasons noted above at the request of Burnadette Pop, MD for evaluation of urinary retention and difficult catheter placement  This is a 73 y.o. male with past medical history significant for dementia.  He presented from an outside facility after falling, and was found to be unable to urinate.  He was also noticed to have red-tinged urine coming from his penis.  Catheter was attempted by nursing staff and ED providers, but his significant phimosis prevented passage of any size catheter.  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   Hard of hearing    Hx of adenomatous colonic polyps 01/29/2016   Hypertension    Substance abuse (HCC)    ETOH, past   Urinary incontinence     Past Surgical History:  Past Surgical History:  Procedure Laterality Date   HIP SURGERY     ORIF ANKLE FRACTURE Left 01/04/2021   Procedure: OPEN REDUCTION INTERNAL FIXATION (ORIF) ANKLE FRACTURE;  Surgeon: Terance Hart, MD;  Location: [facility] OR;  Service: Orthopedics;  Laterality: Left;  LENGHT OF SURGERY: 90 MINUTES   TUMOR REMOVAL     not tumor, but aneurysm   VENTRICULOPERITONEAL SHUNT  10/17/2011   Procedure: SHUNT INSERTION VENTRICULAR-PERITONEAL;  Surgeon: Cristi Loron, MD;  Location: [facility];  Service: Neurosurgery;  Laterality: Right;  Instertion of Ventricular-Peritoneal Shunt   VENTRICULOPERITONEAL SHUNT     once at age 26 and replaced 2013.    Medication: Current Facility-Administered Medications  Medication Dose Route Frequency Provider Last Rate Last Admin   0.9 %  sodium chloride infusion   Intravenous Continuous Burnadette Pop, MD 100 mL/hr at 05/06/23 0005 New Bag at 05/06/23 0005   cefTRIAXone (ROCEPHIN) 1 g in sodium chloride 0.9 % 100 mL IVPB  1 g Intravenous Q24H Earlie Lou L, MD 200 mL/hr at 05/05/23 2018 1 g at 05/05/23 2018   morphine (PF) 2 MG/ML injection 2 mg  2 mg Intravenous Q2H PRN Earlie Lou L, MD   2 mg at 05/06/23 0256   ondansetron (ZOFRAN) tablet 4 mg  4 mg Oral Q6H PRN Rometta Emery, MD       Or   ondansetron (ZOFRAN) injection 4 mg  4 mg Intravenous Q6H PRN Rometta Emery, MD       tamsulosin (FLOMAX) capsule 0.4 mg  0.4 mg Oral Daily Burnadette Pop, MD   0.4 mg at 05/05/23 1044    Allergies: Allergies  Allergen Reactions   Tuberculin Purified Protein Derivative Other (See Comments)    "Allergic," per [facility]   Tuberculin Tests Other (See Comments)    "Allergic," per [facility]    Social History: Social History   Tobacco Use   Smoking status: Former    Current packs/day: 0.00    Types: Cigarettes    Quit date: 11/11/2011    Years since quitting: 11.4   Smokeless tobacco: Never  Substance Use Topics   Alcohol use: No    Comment: hx of ETOH abuse   Drug use: No    Family History Family History  Problem Relation Age of Onset   Colon cancer Neg Hx  Review of Systems 10 systems were reviewed and are negative except as noted specifically in the HPI.  Objective   Vital signs in last 24 hours: BP (!) 103/52 (BP Location: Right Arm)   Pulse 82   Temp 98.1 F (36.7 C)   Resp 14   SpO2 95%   Physical Exam General: NAD, A&O, resting, Appears mildly confused HEENT: [place]/AT, EOMI, MMM Pulmonary: Normal work of breathing Cardiovascular: HDS, adequate peripheral perfusion Abdomen: Soft, distended suprapubic area GU: significant phimosis unable to be pulled back, foul smelling urine Extremities: warm and well perfused Neuro: Appropriate, no focal neurological deficits  Most Recent Labs: Lab Results  Component Value Date   WBC 9.5 05/06/2023   HGB 10.7 (L)  05/06/2023   HCT 33.4 (L) 05/06/2023   PLT 145 (L) 05/06/2023    Lab Results  Component Value Date   NA 140 05/06/2023   K 3.6 05/06/2023   CL 110 05/06/2023   CO2 23 05/06/2023   BUN 36 (H) 05/06/2023   CREATININE 1.36 (H) 05/06/2023   CALCIUM 8.1 (L) 05/06/2023   MG 1.7 09/06/2009    Lab Results  Component Value Date   INR 1.29 10/17/2011     Urine Culture: @[id number] (laburin,org,[id number],[id number])@   IMAGING: CT ABDOMEN PELVIS WO CONTRAST Result Date: 05/04/2023 CLINICAL DATA:  Larey Seat, head trauma, abdominal pain EXAM: CT ABDOMEN AND PELVIS WITHOUT CONTRAST TECHNIQUE: Multidetector CT imaging of the abdomen and pelvis was performed following the standard protocol without IV contrast. Unenhanced CT was performed per clinician order. Lack of IV contrast limits sensitivity and specificity, especially for evaluation of abdominal/pelvic solid viscera and vascular structures in the setting of trauma. RADIATION DOSE REDUCTION: This exam was performed according to the departmental dose-optimization program which includes automated exposure control, adjustment of the mA and/or kV according to patient size and/or use of iterative reconstruction technique. COMPARISON:  None Available. FINDINGS: Lower chest: No acute pleural or parenchymal lung disease. Hepatobiliary: Scattered hepatic hypodensities are identified, most compatible with cysts. No evidence of acute liver abnormality on this limited unenhanced exam. The gallbladder is unremarkable. Pancreas: Unremarkable unenhanced appearance. Spleen: Unremarkable unenhanced appearance. Adrenals/Urinary Tract: There is diffuse bladder wall thickening, with trabeculations and marked distension consistent with chronic bladder outlet obstruction. There is bilateral hydroureteronephrosis, likely due to bladder distension. No urinary tract calculi. No evidence of renal injury on this unenhanced exam. The adrenals are unremarkable. Stomach/Bowel: No bowel  obstruction or ileus. Normal appendix right lower quadrant. Scattered colonic diverticulosis without evidence of acute diverticulitis. No bowel wall thickening or inflammatory change. Vascular/Lymphatic: Aortic atherosclerosis. No enlarged abdominal or pelvic lymph nodes. Reproductive: Prostate is enlarged, measuring 5.8 x 6.4 x 5.6 cm. Other: No free fluid or free intraperitoneal gas. No abdominal wall hernia. Ventriculostomy catheter enters the abdomen in the right upper quadrant, distal tip within the left lower quadrant. No evidence of catheter discontinuity. Musculoskeletal: No acute or destructive bony abnormalities. Prior ORIF right femur. Chronic L1 compression deformity. Reconstructed images demonstrate no additional findings. IMPRESSION: 1. No acute intra-abdominal or intrapelvic trauma on this unenhanced exam. 2. 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 3. Bilateral hydroureteronephrosis, likely due to bladder distension and chronic bladder outlet obstruction. No urinary tract calculi. 4. Colonic diverticulosis without diverticulitis. 5.  Aortic Atherosclerosis (ICD10-I70.0). Electronically Signed   By: Sharlet Salina M.D.   On: 05/04/2023 19:13   CT Head Wo Contrast Result Date: 05/04/2023 CLINICAL DATA:  Head and neck  trauma EXAM: CT HEAD WITHOUT CONTRAST CT CERVICAL SPINE WITHOUT CONTRAST TECHNIQUE: Multidetector CT imaging of the head and cervical spine was performed following the standard protocol without intravenous contrast. Multiplanar CT image reconstructions of the cervical spine were also generated. RADIATION DOSE REDUCTION: This exam was performed according to the departmental dose-optimization program which includes automated exposure control, adjustment of the mA and/or kV according to patient size and/or use of iterative reconstruction technique. COMPARISON:  01/13/2017 FINDINGS: CT HEAD FINDINGS Brain: No  evidence of acute infarction, hemorrhage, extra-axial collection or mass lesion/mass effect. Unchanged caliber and configuration of the ventricles, with right parietal and right occipital approach intraventricular shunt catheters. Periventricular white matter as well as encephalomalacia of the cerebellum and marked prominence of the fourth ventricle. Vascular: No hyperdense vessel or unexpected calcification. Skull: Normal. Negative for fracture or focal lesion. Sinuses/Orbits: No acute finding. Other: None. CT CERVICAL SPINE FINDINGS Alignment: Degenerative straightening and reversal of the normal cervical lordosis. Skull base and vertebrae: No acute fracture. No primary bone lesion or focal pathologic process. Soft tissues and spinal canal: No prevertebral fluid or swelling. No visible canal hematoma. Disc levels: Severe disc degenerative disease of the mid to lower cervical spine. Upper chest: Negative. Other: None. IMPRESSION: 1. No acute intracranial pathology. 2. Unchanged caliber and configuration of the ventricles, with right parietal and right occipital approach intraventricular shunt catheters. Periventricular white matter as well as encephalomalacia of the cerebellum and marked prominence of the fourth ventricle. 3. No fracture or static subluxation of the cervical spine. 4. Severe disc degenerative disease of the mid to lower cervical spine. Electronically Signed   By: Jearld Lesch M.D.   On: 05/04/2023 13:43   CT Cervical Spine Wo Contrast Result Date: 05/04/2023 CLINICAL DATA:  Head and neck trauma EXAM: CT HEAD WITHOUT CONTRAST CT CERVICAL SPINE WITHOUT CONTRAST TECHNIQUE: Multidetector CT imaging of the head and cervical spine was performed following the standard protocol without intravenous contrast. Multiplanar CT image reconstructions of the cervical spine were also generated. RADIATION DOSE REDUCTION: This exam was performed according to the departmental dose-optimization program which  includes automated exposure control, adjustment of the mA and/or kV according to patient size and/or use of iterative reconstruction technique. COMPARISON:  01/13/2017 FINDINGS: CT HEAD FINDINGS Brain: No evidence of acute infarction, hemorrhage, extra-axial collection or mass lesion/mass effect. Unchanged caliber and configuration of the ventricles, with right parietal and right occipital approach intraventricular shunt catheters. Periventricular white matter as well as encephalomalacia of the cerebellum and marked prominence of the fourth ventricle. Vascular: No hyperdense vessel or unexpected calcification. Skull: Normal. Negative for fracture or focal lesion. Sinuses/Orbits: No acute finding. Other: None. CT CERVICAL SPINE FINDINGS Alignment: Degenerative straightening and reversal of the normal cervical lordosis. Skull base and vertebrae: No acute fracture. No primary bone lesion or focal pathologic process. Soft tissues and spinal canal: No prevertebral fluid or swelling. No visible canal hematoma. Disc levels: Severe disc degenerative disease of the mid to lower cervical spine. Upper chest: Negative. Other: None. IMPRESSION: 1. No acute intracranial pathology. 2. Unchanged caliber and configuration of the ventricles, with right parietal and right occipital approach intraventricular shunt catheters. Periventricular white matter as well as encephalomalacia of the cerebellum and marked prominence of the fourth ventricle. 3. No fracture or static subluxation of the cervical spine. 4. Severe disc degenerative disease of the mid to lower cervical spine. Electronically Signed   By: Jearld Lesch M.D.   On: 05/04/2023 13:43   DG Chest 2 View  Result Date: 05/04/2023 CLINICAL DATA:  Patient slipped and fell while getting into bed. EXAM: CHEST - 2 VIEW COMPARISON:  None Available. FINDINGS: The lungs are clear without focal pneumonia, edema, pneumothorax or pleural effusion. The cardiopericardial silhouette is within  normal limits for size. VP shunt tubing overlies the medial right chest. No acute bony abnormality. IMPRESSION: No active cardiopulmonary disease. Electronically Signed   By: Kennith Center M.D.   On: 05/04/2023 13:24    ------  Assessment:  73 y.o. male with past medical history significant for dementia who presents from facility after fall and was noticed to have blood-tinged urine and inability to urinate.  Urology was consulted given inability place a catheter.  I was able to successfully place a catheter over a sensor wire.  It the patient has significant phimosis, and likely has a hematoma to his glans penis.  He would benefit from a future circumcision.  Patient is in florid renal failure at this time, with creatinine of 3.88.  This is very much likely due to longstanding obstruction.  Upon placement of the Foley catheter, there was extremely dark urine that appeared uremic.  I have low suspicion that his urine will create significant clotting, and will defer CBI at this time.  Patient will need to be admitted to medicine for close intake and output monitoring in the setting of likely postobstructive diuresis.  He also need to keep his catheter for prolonged period of time  Update:  Creatinine continues to downtrend, 1.3 today HDS UOP has cleared - now clear yellow  Recommendations: - continue catheter - Dr. Alvester Morin has discussed circumcision with pt, urology will scheduled definitive surgical management - okay to discharge from urology standpoint WITH catheter when deemed stable from primary team  - urology will sign off at this time   Roby Lofts, MD Resident Physician [facility]  Thank you for this consult. Please contact the urology consult pager with any further questions/concerns.

## 2023-05-06 NOTE — Plan of Care (Signed)

## 2023-05-07 DIAGNOSIS — N179 Acute kidney failure, unspecified: Secondary | ICD-10-CM | POA: Diagnosis not present

## 2023-05-07 LAB — URINE CULTURE: Culture: >=100000 — AB

## 2023-05-07 LAB — BASIC METABOLIC PANEL
Anion gap: 9 (ref 5–15)
BUN: 22 mg/dL (ref 8–23)
CO2: 24 mmol/L (ref 22–32)
Calcium: 8.2 mg/dL — ABNORMAL LOW (ref 8.9–10.3)
Chloride: 106 mmol/L (ref 98–111)
Creatinine, Ser: 1.12 mg/dL (ref 0.61–1.24)
GFR, Estimated: >60 mL/min (ref >60)
Glucose, Bld: 93 mg/dL (ref 70–99)
Potassium: 3.8 mmol/L (ref 3.5–5.1)
Sodium: 139 mmol/L (ref 135–145)

## 2023-05-07 MED ORDER — AMOXICILLIN-POT CLAVULANATE 875-125 MG PO TABS: 1.0000 | ORAL_TABLET | Freq: Two times a day (BID) | ORAL | 0 refills | Status: AC

## 2023-05-07 MED ORDER — AMOXICILLIN-POT CLAVULANATE 875-125 MG PO TABS
1.0000 | ORAL_TABLET | Freq: Two times a day (BID) | ORAL | Status: DC
  Administered 2023-05-07: 1 via ORAL
  Filled 2023-05-07: qty 1

## 2023-05-07 NOTE — NC FL2 (Signed)
[place] MEDICAID FL2 LEVEL OF CARE FORM     IDENTIFICATION  Patient Name: Kurt Walton Birthdate: 1951/02/17 Sex: male Admission Date (Current Location): 05/04/2023  [facility] and [place] Number:  Producer, television/film/video and Address:  [facility],  [address], [place] [address]      Provider Number: [id number]  Attending Physician Name and Address:  Burnadette Pop, MD  Relative Name and Phone Number:       Current Level of Care: Hospital Recommended Level of Care: Nursing Facility Prior Approval Number:    Date Approved/Denied:   PASRR Number: [id number]  Discharge Plan: Other (Comment) (LTC facility)    Current Diagnoses: Patient Active Problem List   Diagnosis Date Noted   Obstructed, uropathy 05/05/2023   AKI (acute kidney injury) (HCC) 05/04/2023   Hx of adenomatous colonic polyps 01/29/2016   Hydrocephalus (HCC) 10/18/2011   HTN (hypertension) 10/18/2011    Orientation RESPIRATION BLADDER Height & Weight     Self, Place  Normal Incontinent, Indwelling catheter Weight: 184 lb 11.9 oz (83.8 kg) Height:  5\' 9"  (175.3 cm)  BEHAVIORAL SYMPTOMS/MOOD NEUROLOGICAL BOWEL NUTRITION STATUS      Continent Diet (heart healthy)  AMBULATORY STATUS COMMUNICATION OF NEEDS Skin   Total Care Verbally Normal                       Personal Care Assistance Level of Assistance  Bathing, Feeding, Dressing Bathing Assistance: Limited assistance Feeding assistance: Independent Dressing Assistance: Limited assistance     Functional Limitations Info  Sight, Hearing, Speech Sight Info: Adequate Hearing Info: Adequate Speech Info: Adequate    SPECIAL CARE FACTORS FREQUENCY                       Contractures Contractures Info: Not present    Additional Factors Info  Code Status, Allergies Code Status Info: DNR Allergies Info: Tuberculin Purified Protein Derivative  Tuberculin Tests           Current Medications (05/07/2023):  This is  the current hospital active medication list Current Facility-Administered Medications  Medication Dose Route Frequency Provider Last Rate Last Admin   cefTRIAXone (ROCEPHIN) 1 g in sodium chloride 0.9 % 100 mL IVPB  1 g Intravenous Q24H Mikeal Hawthorne, Mohammad L, MD 200 mL/hr at 05/06/23 2121 1 g at 05/06/23 2121   Chlorhexidine Gluconate Cloth 2 % PADS 6 each  6 each Topical Daily Burnadette Pop, MD   6 each at 05/06/23 1715   enoxaparin (LOVENOX) injection 40 mg  40 mg Subcutaneous Daily Len Childs T, RPH   40 mg at 05/06/23 1153   morphine (PF) 2 MG/ML injection 2 mg  2 mg Intravenous Q2H PRN Earlie Lou L, MD   2 mg at 05/06/23 1017   ondansetron (ZOFRAN) tablet 4 mg  4 mg Oral Q6H PRN Rometta Emery, MD       Or   ondansetron (ZOFRAN) injection 4 mg  4 mg Intravenous Q6H PRN Earlie Lou L, MD       pravastatin (PRAVACHOL) tablet 80 mg  80 mg Oral QHS Burnadette Pop, MD   80 mg at 05/06/23 2107   senna (SENOKOT) tablet 8.6 mg  1 tablet Oral QHS Burnadette Pop, MD   8.6 mg at 05/06/23 2107   tamsulosin (FLOMAX) capsule 0.4 mg  0.4 mg Oral Daily Burnadette Pop, MD   0.4 mg at 05/06/23 1016   traZODone (DESYREL) tablet 25 mg  25 mg Oral QHS Burnadette Pop, MD   25 mg at 05/06/23 2107     Discharge Medications: Please see discharge summary for a list of discharge medications.  Relevant Imaging Results:  Relevant Lab Results:   Additional Information SSN:[id number]  Valentina Shaggy Noor Vidales, LCSW

## 2023-05-07 NOTE — TOC Transition Note (Signed)
Transition of Care [facility]) - Discharge Note   Patient Details  Name: Kurt Walton MRN: [id number] Date of Birth: 1950/11/28  Transition of Care [facility]) CM/SW Contact:  Larrie Kass, LCSW Phone Number: 05/07/2023, 8:49 AM   Clinical Narrative:    [name] is to be d/c back to his LTC facility, [facility]. CSW attempted to contact [facility] for transportation at discharge but received no answer and left a voicemail requesting a return call. CSW spoke with Marylu Lund, who stated she would assist with arranging transportation and call this CSW back. CSW later received a call from [facility], and they have scheduled a pickup for 12:30 p.m. today. TOC sign off.    Final next level of care: Skilled Nursing Facility Barriers to Discharge: Continued Medical Work up   Patient Goals and CMS Choice            Discharge Placement                       Discharge Plan and Services Additional resources added to the After Visit Summary for                                       Social Drivers of Health (SDOH) Interventions SDOH Screenings   Food Insecurity: No Food Insecurity (05/05/2023)  Housing: Unknown (05/05/2023)  Transportation Needs: No Transportation Needs (05/05/2023)  Utilities: Not At Risk (05/05/2023)  Social Connections: Patient Unable To Answer (05/05/2023)  Tobacco Use: Medium Risk (05/04/2023)     Readmission Risk Interventions     No data to display

## 2023-05-07 NOTE — Discharge Summary (Signed)
Physician Discharge Summary  Kurt Walton ZOX:[id number] DOB: [birth year] DOA: 05/04/2023  PCP: Jethro Bastos, MD  Admit date: 05/04/2023 Discharge date: 05/07/2023  Admitted From: Home Disposition:  Home  Discharge Condition:Stable CODE STATUS:FULL Diet recommendation: Regular  Brief/Interim Summary: Patient is a 6 male with history of dementia, hypertension, previous history of alcohol use/polysubstance abuse, CVA with left residual hemiparesis who was brought from [place] from rehab after he fell.  He was trying to get up into bed from his wheelchair, fell and struck his forehead. No report of loss of consciousness.  On presentation, he was confused.  Found to have massive bladder distention and also severe phimosis with obstructive uropathy and bilateral hydronephrosis.  Urology consulted.  Foley placed.  Lab work showed AKI with creatinine in the range of 3, now kidney function significantly better.  Started on antibiotics for possible UTI as well.  Urine culture showing providentia, sensitivity pending(microbiology lab said it will be resulting only tomorrow).  Urology recommend outpatient follow-up.  Medically stable for discharge back to nursing facility today.  Following problems were addressed during the hospitalization:  Obstructive uropathy: Found to have severe bladder distention, phimosis.  Urology consulted.  CT imaging showed enlarged prostate with diffuse bladder wall thickening, trabeculation, bilateral hydroureteronephrosis.  Foley has been placed.  Continue Flomax.  Urine is getting clear.  Urology will follow him as an outpatient and we will plan for surgery for phimosis.  Continue Foley catheter on discharge.   AKI: Likely from urine retention.  Continue Foley catheter.  Kidney function normalised.  IV fluids discontinued   Suspected UTI: Currently on ceftriaxone.  Had mild leukocytosis on presentation.  Afebrile.  Cultures showed showing providentia.  Antibiotics changed to  Augmentin for 2 more days  Dementia: Currently mentation at baseline.  Oriented to place only.  Continue to monitor.     History of hyperlipidemia: Takes pravastatin    Debility/deconditioning/fall: Lives at nursing facility.  Uses wheelchair for ambulation.   Discharge Diagnoses:  Principal Problem:   AKI (acute kidney injury) (HCC) Active Problems:   HTN (hypertension)   Obstructed, uropathy    Discharge Instructions  Discharge Instructions     Diet general   Complete by: As directed    Discharge instructions   Complete by: As directed    1)Please take prescribed medication as instructed 2)Continue foley care.  Follow-up with urology as an outpatient in 2 weeks.  Name and number of the provider has been attached   Increase activity slowly   Complete by: As directed       Allergies as of 05/07/2023       Reactions   Tuberculin Purified Protein Derivative Other (See Comments)   "Allergic," per [facility]   Tuberculin Tests Other (See Comments)   "Allergic," per [facility]        Medication List     TAKE these medications    amoxicillin-clavulanate 875-125 MG tablet Commonly known as: AUGMENTIN Take 1 tablet by mouth every 12 (twelve) hours for 2 days.   aspirin EC 81 MG tablet Take 81 mg by mouth in the morning. Swallow whole.   pravastatin 80 MG tablet Commonly known as: PRAVACHOL Take 80 mg by mouth at bedtime.   senna 8.6 MG Tabs tablet Commonly known as: SENOKOT Take 1 tablet by mouth at bedtime.   tamsulosin 0.4 MG Caps capsule Commonly known as: FLOMAX Take 0.4 mg by mouth daily.   traZODone 50 MG tablet Commonly known as: DESYREL Take 25 mg by  mouth at bedtime.   Vitamin D (Ergocalciferol) 1.25 MG (50000 UNIT) Caps capsule Commonly known as: DRISDOL Take 50,000 Units by mouth See admin instructions. Take 50,000 units by mouth on the 7th of every month        Follow-up Information     Jethro Bastos, MD. Schedule an appointment as soon as  possible for a visit .   Specialty: Family Medicine Contact information: [address] [place] [address] [phone]         Crista Elliot, MD. Schedule an appointment as soon as possible for a visit in 2 week(s).   Specialty: Urology Contact information: [address] [place] [address] [phone]                Allergies  Allergen Reactions   Tuberculin Purified Protein Derivative Other (See Comments)    "Allergic," per [facility]   Tuberculin Tests Other (See Comments)    "Allergic," per [facility]    Consultations: Urology   Procedures/Studies: CT ABDOMEN PELVIS WO CONTRAST Result Date: 05/04/2023 CLINICAL DATA:  Larey Seat, head trauma, abdominal pain EXAM: CT ABDOMEN AND PELVIS WITHOUT CONTRAST TECHNIQUE: Multidetector CT imaging of the abdomen and pelvis was performed following the standard protocol without IV contrast. Unenhanced CT was performed per clinician order. Lack of IV contrast limits sensitivity and specificity, especially for evaluation of abdominal/pelvic solid viscera and vascular structures in the setting of trauma. RADIATION DOSE REDUCTION: This exam was performed according to the departmental dose-optimization program which includes automated exposure control, adjustment of the mA and/or kV according to patient size and/or use of iterative reconstruction technique. COMPARISON:  None Available. FINDINGS: Lower chest: No acute pleural or parenchymal lung disease. Hepatobiliary: Scattered hepatic hypodensities are identified, most compatible with cysts. No evidence of acute liver abnormality on this limited unenhanced exam. The gallbladder is unremarkable. Pancreas: Unremarkable unenhanced appearance. Spleen: Unremarkable unenhanced appearance. Adrenals/Urinary Tract: There is diffuse bladder wall thickening, with trabeculations and marked distension consistent with chronic bladder outlet obstruction. There is bilateral hydroureteronephrosis, likely due to  bladder distension. No urinary tract calculi. No evidence of renal injury on this unenhanced exam. The adrenals are unremarkable. Stomach/Bowel: No bowel obstruction or ileus. Normal appendix right lower quadrant. Scattered colonic diverticulosis without evidence of acute diverticulitis. No bowel wall thickening or inflammatory change. Vascular/Lymphatic: Aortic atherosclerosis. No enlarged abdominal or pelvic lymph nodes. Reproductive: Prostate is enlarged, measuring 5.8 x 6.4 x 5.6 cm. Other: No free fluid or free intraperitoneal gas. No abdominal wall hernia. Ventriculostomy catheter enters the abdomen in the right upper quadrant, distal tip within the left lower quadrant. No evidence of catheter discontinuity. Musculoskeletal: No acute or destructive bony abnormalities. Prior ORIF right femur. Chronic L1 compression deformity. Reconstructed images demonstrate no additional findings. IMPRESSION: 1. No acute intra-abdominal or intrapelvic trauma on this unenhanced exam. 2. 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 3. Bilateral hydroureteronephrosis, likely due to bladder distension and chronic bladder outlet obstruction. No urinary tract calculi. 4. Colonic diverticulosis without diverticulitis. 5.  Aortic Atherosclerosis (ICD10-I70.0). Electronically Signed   By: Sharlet Salina M.D.   On: 05/04/2023 19:13   CT Head Wo Contrast Result Date: 05/04/2023 CLINICAL DATA:  Head and neck trauma EXAM: CT HEAD WITHOUT CONTRAST CT CERVICAL SPINE WITHOUT CONTRAST TECHNIQUE: Multidetector CT imaging of the head and cervical spine was performed following the standard protocol without intravenous contrast. Multiplanar CT image reconstructions of the cervical spine were  also generated. RADIATION DOSE REDUCTION: This exam was performed according to the departmental dose-optimization program which includes automated exposure control, adjustment of the  mA and/or kV according to patient size and/or use of iterative reconstruction technique. COMPARISON:  01/13/2017 FINDINGS: CT HEAD FINDINGS Brain: No evidence of acute infarction, hemorrhage, extra-axial collection or mass lesion/mass effect. Unchanged caliber and configuration of the ventricles, with right parietal and right occipital approach intraventricular shunt catheters. Periventricular white matter as well as encephalomalacia of the cerebellum and marked prominence of the fourth ventricle. Vascular: No hyperdense vessel or unexpected calcification. Skull: Normal. Negative for fracture or focal lesion. Sinuses/Orbits: No acute finding. Other: None. CT CERVICAL SPINE FINDINGS Alignment: Degenerative straightening and reversal of the normal cervical lordosis. Skull base and vertebrae: No acute fracture. No primary bone lesion or focal pathologic process. Soft tissues and spinal canal: No prevertebral fluid or swelling. No visible canal hematoma. Disc levels: Severe disc degenerative disease of the mid to lower cervical spine. Upper chest: Negative. Other: None. IMPRESSION: 1. No acute intracranial pathology. 2. Unchanged caliber and configuration of the ventricles, with right parietal and right occipital approach intraventricular shunt catheters. Periventricular white matter as well as encephalomalacia of the cerebellum and marked prominence of the fourth ventricle. 3. No fracture or static subluxation of the cervical spine. 4. Severe disc degenerative disease of the mid to lower cervical spine. Electronically Signed   By: Jearld Lesch M.D.   On: 05/04/2023 13:43   CT Cervical Spine Wo Contrast Result Date: 05/04/2023 CLINICAL DATA:  Head and neck trauma EXAM: CT HEAD WITHOUT CONTRAST CT CERVICAL SPINE WITHOUT CONTRAST TECHNIQUE: Multidetector CT imaging of the head and cervical spine was performed following the standard protocol without intravenous contrast. Multiplanar CT image reconstructions of the  cervical spine were also generated. RADIATION DOSE REDUCTION: This exam was performed according to the departmental dose-optimization program which includes automated exposure control, adjustment of the mA and/or kV according to patient size and/or use of iterative reconstruction technique. COMPARISON:  01/13/2017 FINDINGS: CT HEAD FINDINGS Brain: No evidence of acute infarction, hemorrhage, extra-axial collection or mass lesion/mass effect. Unchanged caliber and configuration of the ventricles, with right parietal and right occipital approach intraventricular shunt catheters. Periventricular white matter as well as encephalomalacia of the cerebellum and marked prominence of the fourth ventricle. Vascular: No hyperdense vessel or unexpected calcification. Skull: Normal. Negative for fracture or focal lesion. Sinuses/Orbits: No acute finding. Other: None. CT CERVICAL SPINE FINDINGS Alignment: Degenerative straightening and reversal of the normal cervical lordosis. Skull base and vertebrae: No acute fracture. No primary bone lesion or focal pathologic process. Soft tissues and spinal canal: No prevertebral fluid or swelling. No visible canal hematoma. Disc levels: Severe disc degenerative disease of the mid to lower cervical spine. Upper chest: Negative. Other: None. IMPRESSION: 1. No acute intracranial pathology. 2. Unchanged caliber and configuration of the ventricles, with right parietal and right occipital approach intraventricular shunt catheters. Periventricular white matter as well as encephalomalacia of the cerebellum and marked prominence of the fourth ventricle. 3. No fracture or static subluxation of the cervical spine. 4. Severe disc degenerative disease of the mid to lower cervical spine. Electronically Signed   By: Jearld Lesch M.D.   On: 05/04/2023 13:43   DG Chest 2 View Result Date: 05/04/2023 CLINICAL DATA:  Patient slipped and fell while getting into bed. EXAM: CHEST - 2 VIEW COMPARISON:  None  Available. FINDINGS: The lungs are clear without focal pneumonia, edema, pneumothorax or pleural effusion. The cardiopericardial  silhouette is within normal limits for size. VP shunt tubing overlies the medial right chest. No acute bony abnormality. IMPRESSION: No active cardiopulmonary disease. Electronically Signed   By: Kennith Center M.D.   On: 05/04/2023 13:24      Subjective: Patient seen and examined at bedside today.  Hemodynamically stable.  Comfortable.  Lying in bed.  Foley in place .  Medically stable for discharge.I called her daughter [name] twice on phone for discharge update, cudnt be connected  Discharge Exam: Vitals:   05/06/23 1942 05/07/23 0356  BP: 104/71 103/68  Pulse: 84 74  Resp: 18 18  Temp: 98.7 F (37.1 C) 98.6 F (37 C)  SpO2: 97% 98%   Vitals:   05/06/23 0510 05/06/23 1520 05/06/23 1942 05/07/23 0356  BP: (!) 103/52 108/68 104/71 103/68  Pulse: 82 81 84 74  Resp: 14 18 18 18   Temp: 98.1 F (36.7 C) 98.8 F (37.1 C) 98.7 F (37.1 C) 98.6 F (37 C)  TempSrc:      SpO2: 95% 98% 97% 98%  Weight:      Height:        General: Pt is alert, awake, not in acute distress,confused Cardiovascular: RRR, S1/S2 +, no rubs, no gallops Respiratory: CTA bilaterally, no wheezing, no rhonchi Abdominal: Soft, NT, ND, bowel sounds + Extremities: no edema, no cyanosis    The results of significant diagnostics from this hospitalization (including imaging, microbiology, ancillary and laboratory) are listed below for reference.     Microbiology: Recent Results (from the past 240 hours)  Urine Culture     Status: Abnormal   Collection Time: 05/04/23  8:33 PM   Specimen: Urine, Catheterized  Result Value Ref Range Status   Specimen Description   Final    URINE, CATHETERIZED Performed at [facility], [address], [place], [place] [address]    Special Requests   Final    NONE Performed at [facility], [address] [place], [place] [address]    Culture (A)  Final    >=100,000 COLONIES/mL PROVIDENCIA STUARTII SUSCEPTIBILITIES TO FOLLOW Performed at [facility], [address], [place], [place] [address]    Report Status 05/07/2023 FINAL  Final   Organism ID, Bacteria PROVIDENCIA STUARTII (A)  Final      Susceptibility   Providencia stuartii - MIC*    AMPICILLIN RESISTANT Resistant     CEFEPIME <=0.12 SENSITIVE Sensitive     CEFTRIAXONE <=0.25 SENSITIVE Sensitive     CIPROFLOXACIN >=4 RESISTANT Resistant     GENTAMICIN RESISTANT Resistant     IMIPENEM 0.5 SENSITIVE Sensitive     NITROFURANTOIN 256 RESISTANT Resistant     TRIMETH/SULFA <=20 SENSITIVE Sensitive     AMPICILLIN/SULBACTAM <=2 SENSITIVE Sensitive     PIP/TAZO <=4 SENSITIVE Sensitive ug/mL    * >=100,000 COLONIES/mL PROVIDENCIA STUARTII     Labs: BNP (last 3 results) No results for input(s): "BNP" in the last 8760 hours. Basic Metabolic Panel: Recent Labs  Lab 05/04/23 1657 05/05/23 1024 05/06/23 0446 05/07/23 0444  NA 145 144 140 139  K 4.0 3.7 3.6 3.8  CL 111 112* 110 106  CO2 22 22 23 24   GLUCOSE 105* 98 90 93  BUN 64* 43* 36* 22  CREATININE 3.88* 1.90* 1.36* 1.12  CALCIUM 9.0 8.1* 8.1* 8.2*   Liver Function Tests: Recent Labs  Lab 05/04/23 1657  AST 37  ALT 32  ALKPHOS 50  BILITOT 0.8  PROT 6.5  ALBUMIN 3.5   No results for input(s): "LIPASE", "AMYLASE" in the last 168 hours. No results for input(s): "AMMONIA" in the last 168 hours. CBC: Recent Labs  Lab 05/04/23 1657 05/05/23 1028 05/06/23 0446  WBC 12.1* 12.6* 9.5  NEUTROABS 10.8*  --   --   HGB 11.1* 11.3* 10.7*  HCT 34.6* 34.9* 33.4*  MCV 85.4 85.7 86.1  PLT 175 166 145*   Cardiac Enzymes: No results for input(s): "CKTOTAL", "CKMB", "CKMBINDEX", "TROPONINI" in the last 168 hours. BNP: Invalid input(s): "POCBNP" CBG: No results for input(s): "GLUCAP" in the last 168 hours. D-Dimer No results for input(s): "DDIMER" in the last 72  hours. Hgb A1c No results for input(s): "HGBA1C" in the last 72 hours. Lipid Profile No results for input(s): "CHOL", "HDL", "LDLCALC", "TRIG", "CHOLHDL", "LDLDIRECT" in the last 72 hours. Thyroid function studies No results for input(s): "TSH", "T4TOTAL", "T3FREE", "THYROIDAB" in the last 72 hours.  Invalid input(s): "FREET3" Anemia work up No results for input(s): "VITAMINB12", "FOLATE", "FERRITIN", "TIBC", "IRON", "RETICCTPCT" in the last 72 hours. Urinalysis    Component Value Date/Time   COLORURINE BROWN (A) 05/04/2023 2033   APPEARANCEUR TURBID (A) 05/04/2023 2033   LABSPEC  05/04/2023 2033    TEST NOT REPORTED DUE TO COLOR INTERFERENCE OF URINE PIGMENT   PHURINE  05/04/2023 2033    TEST NOT REPORTED DUE TO COLOR INTERFERENCE OF URINE PIGMENT   GLUCOSEU (A) 05/04/2023 2033    TEST NOT REPORTED DUE TO COLOR INTERFERENCE OF URINE PIGMENT   HGBUR (A) 05/04/2023 2033    TEST NOT REPORTED DUE TO COLOR INTERFERENCE OF URINE PIGMENT   BILIRUBINUR (A) 05/04/2023 2033    TEST NOT REPORTED DUE TO COLOR INTERFERENCE OF URINE PIGMENT   KETONESUR (A) 05/04/2023 2033    TEST NOT REPORTED DUE TO COLOR INTERFERENCE OF URINE PIGMENT   PROTEINUR (A) 05/04/2023 2033    TEST NOT REPORTED DUE TO COLOR INTERFERENCE OF URINE PIGMENT   UROBILINOGEN 1.0 02/05/2012 2158   NITRITE (A) 05/04/2023 2033    TEST NOT REPORTED DUE TO COLOR INTERFERENCE OF URINE PIGMENT   LEUKOCYTESUR (A) 05/04/2023 2033    TEST NOT REPORTED DUE TO COLOR INTERFERENCE OF URINE PIGMENT   Sepsis Labs Recent Labs  Lab 05/04/23 1657 05/05/23 1028 05/06/23 0446  WBC 12.1* 12.6* 9.5   Microbiology Recent Results (from the past 240 hours)  Urine Culture     Status: Abnormal   Collection Time: 05/04/23  8:33 PM   Specimen: Urine, Catheterized  Result Value Ref Range Status   Specimen Description   Final    URINE, CATHETERIZED Performed at [facility], [address] [place], [place] [address]     Special Requests   Final    NONE Performed at [facility], [address], [place], [place] [address]    Culture (A)  Final    >=100,000 COLONIES/mL PROVIDENCIA STUARTII SUSCEPTIBILITIES TO FOLLOW Performed at [facility], [address] [place], [place] [address]    Report Status 05/07/2023 FINAL  Final   Organism ID, Bacteria PROVIDENCIA STUARTII (A)  Final      Susceptibility   Providencia stuartii - MIC*    AMPICILLIN RESISTANT Resistant     CEFEPIME <=0.12 SENSITIVE Sensitive     CEFTRIAXONE <=0.25 SENSITIVE Sensitive     CIPROFLOXACIN >=4 RESISTANT Resistant     GENTAMICIN RESISTANT Resistant     IMIPENEM 0.5 SENSITIVE Sensitive     NITROFURANTOIN 256 RESISTANT  Resistant     TRIMETH/SULFA <=20 SENSITIVE Sensitive     AMPICILLIN/SULBACTAM <=2 SENSITIVE Sensitive     PIP/TAZO <=4 SENSITIVE Sensitive ug/mL    * >=100,000 COLONIES/mL PROVIDENCIA STUARTII    Please note: You were cared for by a hospitalist during your hospital stay. Once you are discharged, your primary care physician will handle any further medical issues. Please note that NO REFILLS for any discharge medications will be authorized once you are discharged, as it is imperative that you return to your primary care physician (or establish a relationship with a primary care physician if you do not have one) for your post hospital discharge needs so that they can reassess your need for medications and monitor your lab values.    Time coordinating discharge: 40 minutes  SIGNED:   Burnadette Pop, MD  [facility] 05/07/2023, 9:54 AM Pager [phone]  If 7PM-7AM, please contact night-coverage [email or web address] Password [email or web address]

## 2023-06-01 ENCOUNTER — Other Ambulatory Visit: Payer: Self-pay

## 2023-06-01 ENCOUNTER — Emergency Department (HOSPITAL_COMMUNITY): Payer: Medicare (Managed Care)

## 2023-06-01 ENCOUNTER — Inpatient Hospital Stay (HOSPITAL_COMMUNITY)
Admission: EM | Admit: 2023-06-01 | Discharge: 2023-06-09 | DRG: 698 | Disposition: A | Discharge status: Skilled Nursing Facility | Payer: Medicare (Managed Care) | Source: Skilled Nursing Facility | Attending: Internal Medicine | Admitting: Internal Medicine

## 2023-06-01 ENCOUNTER — Encounter (HOSPITAL_COMMUNITY): Payer: Self-pay

## 2023-06-01 DIAGNOSIS — R7401 Elevation of levels of liver transaminase levels: Secondary | ICD-10-CM | POA: Diagnosis not present

## 2023-06-01 DIAGNOSIS — N401 Enlarged prostate with lower urinary tract symptoms: Secondary | ICD-10-CM | POA: Diagnosis present

## 2023-06-01 DIAGNOSIS — H9190 Unspecified hearing loss, unspecified ear: Secondary | ICD-10-CM | POA: Diagnosis present

## 2023-06-01 DIAGNOSIS — E440 Moderate protein-calorie malnutrition: Secondary | ICD-10-CM | POA: Diagnosis present

## 2023-06-01 DIAGNOSIS — Z1152 Encounter for screening for COVID-19: Secondary | ICD-10-CM

## 2023-06-01 DIAGNOSIS — E8809 Other disorders of plasma-protein metabolism, not elsewhere classified: Secondary | ICD-10-CM | POA: Diagnosis present

## 2023-06-01 DIAGNOSIS — A419 Sepsis, unspecified organism: Secondary | ICD-10-CM | POA: Diagnosis present

## 2023-06-01 DIAGNOSIS — I69354 Hemiplegia and hemiparesis following cerebral infarction affecting left non-dominant side: Secondary | ICD-10-CM | POA: Diagnosis not present

## 2023-06-01 DIAGNOSIS — R6521 Severe sepsis with septic shock: Secondary | ICD-10-CM | POA: Diagnosis present

## 2023-06-01 DIAGNOSIS — F0390 Unspecified dementia without behavioral disturbance: Secondary | ICD-10-CM | POA: Diagnosis present

## 2023-06-01 DIAGNOSIS — N1832 Chronic kidney disease, stage 3b: Secondary | ICD-10-CM | POA: Diagnosis present

## 2023-06-01 DIAGNOSIS — Z87891 Personal history of nicotine dependence: Secondary | ICD-10-CM | POA: Diagnosis not present

## 2023-06-01 DIAGNOSIS — N390 Urinary tract infection, site not specified: Secondary | ICD-10-CM | POA: Diagnosis present

## 2023-06-01 DIAGNOSIS — N400 Enlarged prostate without lower urinary tract symptoms: Secondary | ICD-10-CM | POA: Diagnosis present

## 2023-06-01 DIAGNOSIS — Z66 Do not resuscitate: Secondary | ICD-10-CM | POA: Diagnosis present

## 2023-06-01 DIAGNOSIS — R339 Retention of urine, unspecified: Secondary | ICD-10-CM | POA: Diagnosis present

## 2023-06-01 DIAGNOSIS — A4151 Sepsis due to Escherichia coli [E. coli]: Secondary | ICD-10-CM | POA: Diagnosis present

## 2023-06-01 DIAGNOSIS — Z1612 Extended spectrum beta lactamase (ESBL) resistance: Secondary | ICD-10-CM | POA: Diagnosis present

## 2023-06-01 DIAGNOSIS — Z79899 Other long term (current) drug therapy: Secondary | ICD-10-CM

## 2023-06-01 DIAGNOSIS — T83511A Infection and inflammatory reaction due to indwelling urethral catheter, initial encounter: Secondary | ICD-10-CM | POA: Diagnosis present

## 2023-06-01 DIAGNOSIS — B9620 Unspecified Escherichia coli [E. coli] as the cause of diseases classified elsewhere: Secondary | ICD-10-CM | POA: Diagnosis not present

## 2023-06-01 DIAGNOSIS — I129 Hypertensive chronic kidney disease with stage 1 through stage 4 chronic kidney disease, or unspecified chronic kidney disease: Secondary | ICD-10-CM | POA: Diagnosis present

## 2023-06-01 DIAGNOSIS — E876 Hypokalemia: Secondary | ICD-10-CM | POA: Diagnosis present

## 2023-06-01 DIAGNOSIS — E785 Hyperlipidemia, unspecified: Secondary | ICD-10-CM | POA: Diagnosis present

## 2023-06-01 DIAGNOSIS — N136 Pyonephrosis: Secondary | ICD-10-CM | POA: Diagnosis present

## 2023-06-01 DIAGNOSIS — Z982 Presence of cerebrospinal fluid drainage device: Secondary | ICD-10-CM | POA: Diagnosis not present

## 2023-06-01 DIAGNOSIS — E46 Unspecified protein-calorie malnutrition: Secondary | ICD-10-CM | POA: Diagnosis not present

## 2023-06-01 DIAGNOSIS — N179 Acute kidney failure, unspecified: Secondary | ICD-10-CM | POA: Diagnosis present

## 2023-06-01 DIAGNOSIS — Y846 Urinary catheterization as the cause of abnormal reaction of the patient, or of later complication, without mention of misadventure at the time of the procedure: Secondary | ICD-10-CM | POA: Diagnosis present

## 2023-06-01 DIAGNOSIS — N320 Bladder-neck obstruction: Secondary | ICD-10-CM | POA: Diagnosis present

## 2023-06-01 DIAGNOSIS — N471 Phimosis: Secondary | ICD-10-CM | POA: Diagnosis present

## 2023-06-01 DIAGNOSIS — R6520 Severe sepsis without septic shock: Principal | ICD-10-CM

## 2023-06-01 LAB — URINALYSIS, W/ REFLEX TO CULTURE (INFECTION SUSPECTED)
Bilirubin Urine: NEGATIVE
Glucose, UA: NEGATIVE mg/dL
Ketones, ur: NEGATIVE mg/dL
Nitrite: NEGATIVE
Protein, ur: 100 mg/dL — AB
RBC / HPF: >50 RBC/hpf (ref 0–5)
Specific Gravity, Urine: 1.013 (ref 1.005–1.030)
WBC, UA: >50 WBC/hpf (ref 0–5)
pH: 5 (ref 5.0–8.0)

## 2023-06-01 LAB — COMPREHENSIVE METABOLIC PANEL
ALT: 50 U/L — ABNORMAL HIGH (ref 0–44)
AST: 82 U/L — ABNORMAL HIGH (ref 15–41)
Albumin: 1.9 g/dL — ABNORMAL LOW (ref 3.5–5.0)
Alkaline Phosphatase: 99 U/L (ref 38–126)
Anion gap: 13 (ref 5–15)
BUN: 57 mg/dL — ABNORMAL HIGH (ref 8–23)
CO2: 22 mmol/L (ref 22–32)
Calcium: 7.4 mg/dL — ABNORMAL LOW (ref 8.9–10.3)
Chloride: 106 mmol/L (ref 98–111)
Creatinine, Ser: 3.08 mg/dL — ABNORMAL HIGH (ref 0.61–1.24)
GFR, Estimated: 21 mL/min — ABNORMAL LOW (ref >60)
Glucose, Bld: 112 mg/dL — ABNORMAL HIGH (ref 70–99)
Potassium: 3.2 mmol/L — ABNORMAL LOW (ref 3.5–5.1)
Sodium: 141 mmol/L (ref 135–145)
Total Bilirubin: 0.9 mg/dL (ref 0.0–1.2)
Total Protein: 6.3 g/dL — ABNORMAL LOW (ref 6.5–8.1)

## 2023-06-01 LAB — CBC WITH DIFFERENTIAL/PLATELET
Abs Immature Granulocytes: 0.17 10*3/uL — ABNORMAL HIGH (ref 0.00–0.07)
Basophils Absolute: 0 10*3/uL (ref 0.0–0.1)
Basophils Relative: 0 %
Eosinophils Absolute: 0 10*3/uL (ref 0.0–0.5)
Eosinophils Relative: 0 %
HCT: 33.7 % — ABNORMAL LOW (ref 39.0–52.0)
Hemoglobin: 10.6 g/dL — ABNORMAL LOW (ref 13.0–17.0)
Immature Granulocytes: 1 %
Lymphocytes Relative: 5 %
Lymphs Abs: 0.6 10*3/uL — ABNORMAL LOW (ref 0.7–4.0)
MCH: 26.4 pg (ref 26.0–34.0)
MCHC: 31.5 g/dL (ref 30.0–36.0)
MCV: 84 fL (ref 80.0–100.0)
Monocytes Absolute: 0.6 10*3/uL (ref 0.1–1.0)
Monocytes Relative: 5 %
Neutro Abs: 11.2 10*3/uL — ABNORMAL HIGH (ref 1.7–7.7)
Neutrophils Relative %: 89 %
Platelets: 166 10*3/uL (ref 150–400)
RBC: 4.01 MIL/uL — ABNORMAL LOW (ref 4.22–5.81)
RDW: 14.7 % (ref 11.5–15.5)
WBC: 12.6 10*3/uL — ABNORMAL HIGH (ref 4.0–10.5)
nRBC: 0 % (ref 0.0–0.2)

## 2023-06-01 LAB — HEMOGLOBIN A1C
Hgb A1c MFr Bld: 5.9 % — ABNORMAL HIGH (ref 4.8–5.6)
Mean Plasma Glucose: 122.63 mg/dL

## 2023-06-01 LAB — RESP PANEL BY RT-PCR (RSV, FLU A&B, COVID)  RVPGX2
Influenza A by PCR: NEGATIVE
Influenza B by PCR: NEGATIVE
Resp Syncytial Virus by PCR: NEGATIVE
SARS Coronavirus 2 by RT PCR: NEGATIVE

## 2023-06-01 LAB — CBG MONITORING, ED
Glucose-Capillary: 109 mg/dL — ABNORMAL HIGH (ref 70–99)
Glucose-Capillary: 82 mg/dL (ref 70–99)
Glucose-Capillary: 96 mg/dL (ref 70–99)

## 2023-06-01 LAB — CBC
HCT: 30.4 % — ABNORMAL LOW (ref 39.0–52.0)
Hemoglobin: 9.5 g/dL — ABNORMAL LOW (ref 13.0–17.0)
MCH: 26.3 pg (ref 26.0–34.0)
MCHC: 31.3 g/dL (ref 30.0–36.0)
MCV: 84.2 fL (ref 80.0–100.0)
Platelets: 138 10*3/uL — ABNORMAL LOW (ref 150–400)
RBC: 3.61 MIL/uL — ABNORMAL LOW (ref 4.22–5.81)
RDW: 14.8 % (ref 11.5–15.5)
WBC: 12 10*3/uL — ABNORMAL HIGH (ref 4.0–10.5)
nRBC: 0 % (ref 0.0–0.2)

## 2023-06-01 LAB — PROTIME-INR
INR: 1.4 — ABNORMAL HIGH (ref 0.8–1.2)
Prothrombin Time: 17.1 s — ABNORMAL HIGH (ref 11.4–15.2)

## 2023-06-01 LAB — I-STAT CG4 LACTIC ACID, ED: Lactic Acid, Venous: 1.1 mmol/L (ref 0.5–1.9)

## 2023-06-01 LAB — CREATININE, SERUM
Creatinine, Ser: 1.88 mg/dL — ABNORMAL HIGH (ref 0.61–1.24)
GFR, Estimated: 37 mL/min — ABNORMAL LOW (ref >60)

## 2023-06-01 LAB — APTT: aPTT: 31 s (ref 24–36)

## 2023-06-01 MED ORDER — LACTATED RINGERS IV BOLUS (SEPSIS)
1000.0000 mL | Freq: Once | INTRAVENOUS | Status: AC
  Administered 2023-06-01: 1000 mL via INTRAVENOUS

## 2023-06-01 MED ORDER — VANCOMYCIN VARIABLE DOSE PER UNSTABLE RENAL FUNCTION (PHARMACIST DOSING): Status: DC

## 2023-06-01 MED ORDER — VANCOMYCIN HCL 1750 MG/350ML IV SOLN
1750.0000 mg | Freq: Once | INTRAVENOUS | Status: AC
  Administered 2023-06-01: 1750 mg via INTRAVENOUS
  Filled 2023-06-01: qty 350

## 2023-06-01 MED ORDER — INSULIN ASPART 100 UNIT/ML IJ SOLN: 0.0000 [IU] | INTRAMUSCULAR | Status: DC

## 2023-06-01 MED ORDER — SODIUM CHLORIDE 0.9 % IV SOLN
250.0000 mL | INTRAVENOUS | Status: AC
Start: 2023-06-01 — End: 2023-06-02

## 2023-06-01 MED ORDER — SODIUM CHLORIDE 0.9 % IV SOLN
2.0000 g | Freq: Once | INTRAVENOUS | Status: AC
  Administered 2023-06-01: 2 g via INTRAVENOUS
  Filled 2023-06-01: qty 12.5

## 2023-06-01 MED ORDER — VANCOMYCIN HCL IN DEXTROSE 1-5 GM/200ML-% IV SOLN
1000.0000 mg | Freq: Once | INTRAVENOUS | Status: DC
  Filled 2023-06-01: qty 200

## 2023-06-01 MED ORDER — NOREPINEPHRINE 4 MG/250ML-% IV SOLN: 2.0000 ug/min | INTRAVENOUS | Status: DC

## 2023-06-01 MED ORDER — LACTATED RINGERS IV SOLN: INTRAVENOUS | Status: DC

## 2023-06-01 MED ORDER — LACTATED RINGERS IV BOLUS
1000.0000 mL | Freq: Once | INTRAVENOUS | Status: AC
  Administered 2023-06-01: 1000 mL via INTRAVENOUS

## 2023-06-01 MED ORDER — METRONIDAZOLE 500 MG/100ML IV SOLN
500.0000 mg | Freq: Once | INTRAVENOUS | Status: AC
  Administered 2023-06-01: 500 mg via INTRAVENOUS
  Filled 2023-06-01: qty 100

## 2023-06-01 MED ORDER — ACETAMINOPHEN 650 MG RE SUPP
650.0000 mg | Freq: Once | RECTAL | Status: AC
  Administered 2023-06-01: 650 mg via RECTAL
  Filled 2023-06-01: qty 1

## 2023-06-01 MED ORDER — POLYETHYLENE GLYCOL 3350 17 G PO PACK: 17.0000 g | PACK | Freq: Every day | ORAL | Status: DC | PRN

## 2023-06-01 MED ORDER — ACETAMINOPHEN 325 MG PO TABS
650.0000 mg | ORAL_TABLET | Freq: Four times a day (QID) | ORAL | Status: DC | PRN
  Administered 2023-06-01: 650 mg via ORAL
  Filled 2023-06-01: qty 2

## 2023-06-01 MED ORDER — SODIUM CHLORIDE 0.9 % IV SOLN: 2.0000 g | INTRAVENOUS | Status: DC

## 2023-06-01 MED ORDER — DOCUSATE SODIUM 100 MG PO CAPS: 100.0000 mg | ORAL_CAPSULE | Freq: Two times a day (BID) | ORAL | Status: DC | PRN

## 2023-06-01 MED ORDER — HEPARIN SODIUM (PORCINE) 5000 UNIT/ML IJ SOLN
5000.0000 [IU] | Freq: Three times a day (TID) | INTRAMUSCULAR | Status: DC
  Administered 2023-06-01 – 2023-06-09 (×23): 5000 [IU] via SUBCUTANEOUS
  Filled 2023-06-01 (×23): qty 1

## 2023-06-01 MED ORDER — NOREPINEPHRINE 4 MG/250ML-% IV SOLN
0.0000 ug/min | INTRAVENOUS | Status: DC
  Administered 2023-06-01 (×2): 2 ug/min via INTRAVENOUS
  Filled 2023-06-01: qty 250

## 2023-06-01 NOTE — Sepsis Progress Note (Signed)
 Sepsis protocol is being followed by [facility].

## 2023-06-01 NOTE — ED Provider Notes (Signed)
  EMERGENCY DEPARTMENT AT [facility] Provider Note   CSN: [id number] Arrival date & time: 06/01/23  1529     History  Chief Complaint  Patient presents with   Altered Mental Status   weakness    Kurt Walton is a 73 y.o. male with PMH as listed below who presents BIB [facility] from [facility] nursing facility d/t weakness, hiccups, fever that started yesterday. Pt has dementia at baseline, but is able to get up on himself . Unable to do so since last night. Pt has also been having consistent hiccups since last night and reportedly complained of abdominal pain. Pt's temp was 101.63F w EMS. Per chart review was admitted recently from 05/04/23-05/07/23 w/ UTI, AKI, obstructive uropathy, phimosis, DC'd w/ foley catheter. Oriented to place only at baseline w/ dementia. Lives at nursing facility and uses wheelchair.  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 Authorizing Provider  aspirin EC 81 MG tablet Take 81 mg by mouth in the morning. Swallow whole.    [provider]  pravastatin (PRAVACHOL) 80 MG tablet Take 80 mg by mouth at bedtime.    [provider]  senna (SENOKOT) 8.6 MG TABS tablet Take 1 tablet by mouth at bedtime.    [provider]  tamsulosin (FLOMAX) 0.4 MG CAPS capsule Take 0.4 mg by mouth daily.    [provider]  traZODone (DESYREL) 50 MG tablet Take 25 mg by mouth at bedtime.    [provider]  Vitamin D, Ergocalciferol, (DRISDOL) 1.25 MG (50000 UNIT) CAPS capsule Take 50,000 Units by mouth See admin instructions. Take 50,000 units by mouth on the 7th of every month    [provider]       Allergies    Tuberculin purified protein derivative and Tuberculin tests    Review of Systems   Review of Systems A 10 point review of systems was performed and is negative unless otherwise reported in HPI.  Physical Exam Updated Vital Signs BP 96/69   Pulse 95   Temp 100 F (37.8 C) (Axillary)   Resp (!) 22   Ht 5\' 9"  (1.753 m)   Wt 83.8 kg   SpO2 98%   BMI 27.28 kg/m  Physical Exam General: Ill-appearing elderly male, lying in bed.  HEENT: PERRLA, Sclera anicteric, dry mucous membranes, trachea midline.  Cardiology: RRR, no murmurs/rubs/gallops. BL radial and DP pulses equal bilaterally.  Resp: Normal respiratory rate and effort. CTAB, no wheezes, rhonchi, crackles.  Abd: Soft, non-tender, non-distended. No rebound tenderness or guarding.  GU: Foley catheter in place with sediment noted in bag and tubing. Purulence noted at tip of penis with no erythema/induration to penis glans or shaft. No erythema swelling, or abnormalities noted to scrotum or perineum. No apparent TTP to groin area.  MSK: No peripheral edema or signs of trauma. Extremities without deformity or TTP. Skin: warm, dry. Back: No CVA tenderness Neuro: A&Ox0, unresponsive to most questions, does not follow most commands, moaning and saying incomprehensible words. Eyes open, protecting airway. CNs II-XII grossly intact. MAEs. Responds to noxious  stimuli in all extremities.   ED Results / Procedures / Treatments   Labs (all labs ordered are listed, but only abnormal results are displayed) Labs Reviewed  COMPREHENSIVE METABOLIC PANEL - Abnormal; Notable for the following components:      Result Value   Potassium 3.2 (*)    Glucose, Bld 112 (*)    BUN 57 (*)    Creatinine, Ser 3.08 (*)    Calcium 7.4 (*)    Total Protein 6.3 (*)    Albumin 1.9 (*)    AST 82 (*)    ALT 50 (*)    GFR, Estimated 21 (*)    All other components within normal limits  CBC WITH DIFFERENTIAL/PLATELET - Abnormal; Notable for the  following components:   WBC 12.6 (*)    RBC 4.01 (*)    Hemoglobin 10.6 (*)    HCT 33.7 (*)    Neutro Abs 11.2 (*)    Lymphs Abs 0.6 (*)    Abs Immature Granulocytes 0.17 (*)    All other components within normal limits  PROTIME-INR - Abnormal; Notable for the following components:   Prothrombin Time 17.1 (*)    INR 1.4 (*)    All other components within normal limits  URINALYSIS, W/ REFLEX TO CULTURE (INFECTION SUSPECTED) - Abnormal; Notable for the following components:   APPearance TURBID (*)    Hgb urine dipstick LARGE (*)    Protein, ur 100 (*)    Leukocytes,Ua MODERATE (*)    Bacteria, UA MANY (*)    All other components within normal limits  CBG MONITORING, ED - Abnormal; Notable for the following components:   Glucose-Capillary 109 (*)    All other components within normal limits  RESP PANEL BY RT-PCR (RSV, FLU A&B, COVID)  RVPGX2  CULTURE, BLOOD (ROUTINE X 2)  CULTURE, BLOOD (ROUTINE X 2)  URINE CULTURE  APTT  I-STAT CG4 LACTIC ACID, ED    EKG EKG Interpretation Date/Time:  Sunday June 01 2023 15:39:26 EDT Ventricular Rate:  95 PR Interval:  111 QRS Duration:  87 QT Interval:  345 QTC Calculation: 434 R Axis:   46  Text Interpretation: Sinus rhythm Borderline short PR interval Borderline T abnormalities, anterior leads Similar to prior EKG Confirmed by Vivi Barrack [id number]) on 06/01/2023 4:01:17 PM  Radiology CT ABDOMEN PELVIS WO CONTRAST Result Date: 06/01/2023 CLINICAL DATA:  Sepsis EXAM: CT ABDOMEN AND PELVIS WITHOUT CONTRAST TECHNIQUE: Multidetector CT imaging of the abdomen and pelvis was performed following the standard protocol without IV contrast. RADIATION DOSE REDUCTION: This exam was performed according to the departmental dose-optimization program which includes automated exposure control, adjustment of the mA and/or kV according to patient size and/or use of iterative reconstruction technique. COMPARISON:  05/04/2023 FINDINGS: Lower chest: No acute  abnormality. Hepatobiliary: Scattered hepatic hypodensities are again seen throughout the liver likely representing small attic cysts. No solid intrahepatic mass is clearly identified on this noncontrast examination. No intra or extrahepatic biliary ductal dilation. Gallbladder is decompressed and is unremarkable. Pancreas: Unremarkable Spleen: Unremarkable Adrenals/Urinary Tract: Foley catheter balloon is now seen within the bladder lumen and the bladder is decompressed. Previously noted bilateral hydroureteronephrosis has resolved. The bladder wall is diffusely thickened in keeping with changes chronic bladder outlet obstruction. The kidneys are unremarkable in this noncontrast examination. The adrenal glands are unremarkable. Stomach/Bowel: The distal colon and rectal vault to fluid-filled keeping with a diarrheal state. The stomach, small bowel, and large are of all is unremarkable. Ventriculoperitoneal shunt catheter tubing  is seen looped with pelvis with its tip within the right lower quadrant. No free intraperitoneal gas or fluid. Appendix normal. Vascular/Lymphatic: Aortic atherosclerosis. No enlarged abdominal or pelvic lymph nodes. Reproductive: Moderate prostatic hypertrophy again noted with estimated prostatic volume of 70-80 cc. Other: No abdominal wall hernia or abnormality. No abdominopelvic ascites. Musculoskeletal: Right femoral nail partially visualized. Moderate lumbar dextroscoliosis is present superimposed degenerative changes throughout the lumbar spine. Superimposed anterior wedge compression deformity of L1 is present with 50-60% loss of height anteriorly and no retropulsion, chronic in nature. No acute bone abnormality. No lytic or blastic bone lesion. IMPRESSION: 1. Fluid-filled distal colon and rectal vault in keeping with a diarrheal state. 2. Interval placement of Foley catheter with decompression of the bladder. Previously noted bilateral hydroureteronephrosis has resolved. 3. Moderate  prostatic hypertrophy with changes of chronic bladder outlet obstruction. Aortic Atherosclerosis (ICD10-I70.0). Electronically Signed   By: Helyn Numbers M.D.   On: 06/01/2023 19:32   DG Chest Port 1 View Result Date: 06/01/2023 CLINICAL DATA:  Sepsis. EXAM: PORTABLE CHEST 1 VIEW COMPARISON:  X-ray 05/04/2023 FINDINGS: Underinflation. Stable cardiopericardial silhouette with calcified aorta. No pneumothorax or effusion. There is some asymmetric opacity at the left lung base. Infiltrate is possible. Recommend follow-up. Overlapping cardiac leads. There is vertical oriented tube along the right hemithorax. Please correlate for known history of a VP shunt. IMPRESSION: Underinflation with mild left lung base opacity. Infiltrates possible. Recommend follow-up. Known VP shunt. Electronically Signed   By: Karen Kays M.D.   On: 06/01/2023 16:24    Procedures .Critical Care  Performed by: Loetta Rough, MD Authorized by: Loetta Rough, MD   Critical care provider statement:    Critical care time (minutes):  50   Critical care was necessary to treat or prevent imminent or life-threatening deterioration of the following conditions:  Sepsis and shock   Critical care was time spent personally by me on the following activities:  Development of treatment plan with patient or surrogate, discussions with consultants, evaluation of patient's response to treatment, examination of patient, ordering and review of laboratory studies, ordering and review of radiographic studies, ordering and performing treatments and interventions, pulse oximetry, re-evaluation of patient's condition and review of old charts   Care discussed with: admitting provider       Medications Ordered in ED Medications  lactated ringers infusion ( Intravenous New Bag/Given 06/01/23 1911)  vancomycin (VANCOREADY) IVPB 1750 mg/350 mL (1,750 mg Intravenous New Bag/Given 06/01/23 2004)  norepinephrine (LEVOPHED) 4mg  in (0.016 mg/mL)  premix infusion (0 mcg/min Intravenous Paused 06/01/23 2100)  lactated ringers bolus 1,000 mL (0 mLs Intravenous Stopped 06/01/23 1730)  ceFEPIme (MAXIPIME) 2 g in sodium chloride 0.9 % 100 mL IVPB (0 g Intravenous Stopped 06/01/23 1704)  metroNIDAZOLE (FLAGYL) IVPB 500 mg (0 mg Intravenous Stopped 06/01/23 1954)  acetaminophen (TYLENOL) suppository 650 mg (650 mg Rectal Given 06/01/23 1703)  lactated ringers bolus 1,000 mL (0 mLs Intravenous Stopped 06/01/23 1806)  lactated ringers bolus 1,000 mL (0 mLs Intravenous Stopped 06/01/23 1818)    ED Course/ Medical Decision Making/ A&P                          Medical Decision Making Amount and/or Complexity of Data Reviewed Labs: ordered. Decision-making details documented in ED Course. Radiology: ordered. Decision-making details documented in ED Course.  Risk OTC drugs. Prescription drug management. Decision regarding hospitalization.    This patient presents to the ED for  concern of sepsis, this involves an extensive number of treatment options, and is a complaint that carries with it a high risk of complications and morbidity.  I considered the following differential and admission for this acute, potentially life threatening condition.   MDM:    Patient with new gen weakness, fever, hiccups. Recently DC'd from hospital one month ago w/ indwelling foley. Sediment and purulence noted in urine and on exam. He is started on sepsis protocol and becomes hypotensive, indicating severe sepsis. Started on fluids and BP improves, given abx. Initial lactate wnl. Likely UTI but consider other causes such as possible PNA seen on CXR, though he is not hypoxic and appears to have no increased WOB. Also has h/o hydrocephalus s/p VP shunt but also has dementia at baseline. He has no e/o head trauma one exam. He had also reported abdominal pain, had had hydronephrosis during prior admission, will get CT abd pelvis to evaluate for intraabdominal surgical infection,  worsening obstructive uropathy. He has no guarding/significant TTP on exam to indicate perforated viscous or intraabdominal bleeding.  Clinical Course as of 06/01/23 2126  Wynelle Link Jun 01, 2023  1627 Lactic Acid, Venous: 1.1 wnl [HN]  1657 Hypotensive to 80s systolic. Already had initiated fluids, but placed them on pressure bag and started a second liter, Pressure trending upward.  [HN]  1658 Creatinine(!): 3.08 +AKI. Has foley catheter in place, will be removed and replaced, will obtain CT abd pelvis as well. BUN/Cr 18. [HN]  1659 AST(!): 82 [HN]  1659 ALT(!): 50 Newly mildly elevated LFTs [HN]  1659 WBC(!): 12.6 +leukocytosis w/ left shift [HN]  1700 DG Chest Port 1 View Underinflation with mild left lung base opacity. Infiltrates possible. Recommend follow-up. Known VP shunt.  Possible L pneumoina on CXR   [HN]  1753 Urinalysis, w/ Reflex to Culture (Infection Suspected) -Urine, Clean Catch(!) +UTI [HN]  1754 Recurrently hypotensive. Will give a 3rd liter of fluid. If not sufficient, will start levophed. [HN]  1921 Recurrently hypotnsive after 3L IVF, levophed started and will consult to intensivist [HN]  1921 Consulted to intensivist [HN]  1951 CT ABDOMEN PELVIS WO CONTRAST 1. Fluid-filled distal colon and rectal vault in keeping with a diarrheal state. 2. Interval placement of Foley catheter with decompression of the bladder. Previously noted bilateral hydroureteronephrosis has resolved. 3. Moderate prostatic hypertrophy with changes of chronic bladder outlet obstruction.   [HN]  2030 Repaged to intensivist [HN]  2033 Admitted to Dr. Sherryll Burger with ICU [HN]    Clinical Course User Index [HN] Loetta Rough, MD    Labs: I Ordered, and personally interpreted labs.  The pertinent results include:  those listed above  Imaging Studies ordered: I ordered imaging studies including CTH, CXR, CT abd pelvis wo contrast I independently visualized and interpreted imaging. I agree  with the radiologist interpretation  Additional history obtained from chart review, EMS.   Cardiac Monitoring: The patient was maintained on a cardiac monitor.  I personally viewed and interpreted the cardiac monitored which showed an underlying rhythm of: NSR  Reevaluation: After the interventions noted above, I reevaluated the patient and found that they have :improved  Social Determinants of Health: Lives at facility  Disposition:  Admit to ICU on levophed  Co morbidities that complicate the patient evaluation  Past Medical History:  Diagnosis Date   Abnormal gait    Cerebral aneurysm    age 76 with VP shunt   Hard of hearing    Hx of adenomatous colonic polyps  01/29/2016   Hypertension    Substance abuse (HCC)    ETOH, past   Urinary incontinence      Medicines Meds ordered this encounter  Medications   lactated ringers infusion   lactated ringers bolus 1,000 mL    Reason 30 mL/kg dose is not being ordered:   First Lactic Acid Pending   ceFEPIme (MAXIPIME) 2 g in sodium chloride 0.9 % 100 mL IVPB    Antibiotic Indication::   Other Indication (list below)    Other Indication::   Unknown Source.   metroNIDAZOLE (FLAGYL) IVPB 500 mg    Antibiotic Indication::   Other Indication (list below)    Other Indication::   Unknown Source.   DISCONTD: vancomycin (VANCOCIN) IVPB 1000 mg/200 mL premix    Indication::   Other Indication (list below)    Other Indication::   Unknown Source.   vancomycin (VANCOREADY) IVPB 1750 mg/350 mL    Indication::   Sepsis   acetaminophen (TYLENOL) suppository 650 mg   lactated ringers bolus 1,000 mL   lactated ringers bolus 1,000 mL   norepinephrine (LEVOPHED) 4mg  in (0.016 mg/mL) premix infusion    I have reviewed the patients home medicines and have made adjustments as needed  Problem List / ED Course: Problem List Items Addressed This Visit       Genitourinary   AKI (acute kidney injury) (HCC)   Other Visit Diagnoses        Severe sepsis (HCC)    -  Primary     Urinary tract infection associated with indwelling urethral catheter, initial encounter (HCC)       Relevant Medications   ceFEPIme (MAXIPIME) 2 g in sodium chloride 0.9 % 100 mL IVPB (Completed)   metroNIDAZOLE (FLAGYL) IVPB 500 mg (Completed)   vancomycin (VANCOREADY) IVPB 1750 mg/350 mL (Completed)   vancomycin variable dose per unstable renal function (pharmacist dosing)   ceFEPIme (MAXIPIME) 2 g in sodium chloride 0.9 % 100 mL IVPB (Start on 06/02/2023  4:00 PM)     Transaminitis                       This note was created using dictation software, which may contain spelling or grammatical errors.    Loetta Rough, MD 06/01/23 [phone]

## 2023-06-01 NOTE — ED Notes (Signed)
 Patient had episode of stool incontinence. Patient cleaned and linens changed.

## 2023-06-01 NOTE — H&P (Signed)
 NAME:  Kurt Walton, MRN:  [id number], DOB:  Nov 22, 1950, LOS: 0 ADMISSION DATE:  06/01/2023, CONSULTATION DATE:  06/01/23 REFERRING MD:  EDP, CHIEF COMPLAINT:  sepsis   History of Present Illness:  Kurt Walton is a 73 y.o. 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  He was brought back to the ED 3/9 with worsening mental status a fever.   Purulent discharge around foley noted.  Work-up was significant for hgb, leuks and bacteria on UA, WBC 12.6, lactic acid 1.1.  His foley was exchanged and he was given 3L IVF with persistent hypotension, so critical care was consulted as he required Levophed to maintain adequate MAP's.   Pertinent  Medical History   has a past medical history of Abnormal gait, Cerebral aneurysm, Hard of hearing, adenomatous colonic polyps (01/29/2016), Hypertension, Substance abuse (HCC), and Urinary incontinence.'  Significant Hospital Events: Including procedures, antibiotic start and stop dates in addition to other pertinent events   3/9 admit for sepsis on low dose levophed   Interim History / Subjective:  Pt alert and calm, requiring Levo  Objective   Blood pressure (!) 116/94, pulse 92, temperature 100 F (37.8 C), temperature source Axillary, resp. rate (!) 23, height 5\' 9"  (1.753 m), weight 83.8 kg, SpO2 100%.        Intake/Output Summary (Last 24 hours) at 06/01/2023 2050 Last data filed at 06/01/2023 2003 Gross per 24 hour  Intake 109.59 ml  Output 3000 ml  Net -2890.41 ml   Filed Weights   06/01/23 1916  Weight: 83.8 kg    General:  well-nourished, elderly M resting in bed in NAD HEENT: MM pink/moist, PERRLA Neuro: alert, answers yes and no, but no orientation questions, UE appear chronically contracted CV: s1s2 rrr, no  m/r/g PULM:  clear bilaterally on RA GI: soft, non-distended and nontender Extremities: warm/dry, no edema  Skin: no rashes or lesions  Resolved Hospital Problem list     Assessment & Plan:    Septic shock secondary to UTI In the setting of indwelling catheter and recent Providencia Stuartii UTI 4 weeks ago, sensitive to Cefepime -admit to ICU -received 3L IVF in the ED, continue Norepi to maintain MAP >65 -blood and urine cultures pending -continue Vancomycin and Cefepime  -continue home flomax    AKI Hypokalemia -trend renal indices and electrolytes after IVF -indwelling foley exchanged in the ED -monitor UOP  Mildly elevated transaminases  -trend, supportive care   Baseline Dementia -pleasantly confused, unclear what his baseline is -speech eval for SLP before clearing for diet  HL -continue statin  Best Practice (right click and "Reselect all SmartList Selections" daily)   Diet/type: NPO DVT prophylaxis prophylactic heparin  Pressure ulcer(s): N/A GI prophylaxis: N/A Lines: N/A Foley:  Yes, and it is still needed Code Status:  full code Last date of multidisciplinary goals of care discussion [pending]  Labs   CBC: Recent Labs  Lab 06/01/23 1600  WBC 12.6*  NEUTROABS 11.2*  HGB 10.6*  HCT 33.7*  MCV 84.0  PLT 166    Basic Metabolic Panel: Recent Labs  Lab 06/01/23 1600  NA 141  K 3.2*  CL 106  CO2 22  GLUCOSE 112*  BUN 57*  CREATININE 3.08*  CALCIUM 7.4*   GFR: Estimated Creatinine Clearance: 21.4  mL/min (A) (by C-G formula based on SCr of 3.08 mg/dL (H)). Recent Labs  Lab 06/01/23 1600 06/01/23 1624  WBC 12.6*  --   LATICACIDVEN  --  1.1    Liver Function Tests: Recent Labs  Lab 06/01/23 1600  AST 82*  ALT 50*  ALKPHOS 99  BILITOT 0.9  PROT 6.3*  ALBUMIN 1.9*   No results for input(s): "LIPASE", "AMYLASE" in the last 168 hours. No results for input(s): "AMMONIA" in the last 168 hours.  ABG No results found for:  "PHART", "PCO2ART", "PO2ART", "HCO3", "TCO2", "ACIDBASEDEF", "O2SAT"   Coagulation Profile: Recent Labs  Lab 06/01/23 1600  INR 1.4*    Cardiac Enzymes: No results for input(s): "CKTOTAL", "CKMB", "CKMBINDEX", "TROPONINI" in the last 168 hours.  HbA1C: Hgb A1c MFr Bld  Date/Time Value Ref Range Status  02/06/2012 06:36 AM 5.3 <5.7 % Final    Comment:    (NOTE)                                                                       According to the ADA Clinical Practice Recommendations for 2011, when HbA1c is used as a screening test:  >=6.5%   Diagnostic of Diabetes Mellitus           (if abnormal result is confirmed) 5.7-6.4%   Increased risk of developing Diabetes Mellitus References:Diagnosis and Classification of Diabetes Mellitus,Diabetes Care,2011,34(Suppl 1):S62-S69 and Standards of Medical Care in         Diabetes - 2011,Diabetes Care,2011,34 (Suppl 1):S11-S61.    CBG: Recent Labs  Lab 06/01/23 1630  GLUCAP 109*    Review of Systems:   Unable to obtain secondary to mental status  Past Medical History:  He,  has a past medical history of Abnormal gait, Cerebral aneurysm, Hard of hearing, adenomatous colonic polyps (01/29/2016), Hypertension, Substance abuse (HCC), and Urinary incontinence.   Surgical History:   Past Surgical History:  Procedure Laterality Date   HIP SURGERY     ORIF ANKLE FRACTURE Left 01/04/2021   Procedure: OPEN REDUCTION INTERNAL FIXATION (ORIF) ANKLE FRACTURE;  Surgeon: Terance Hart, MD;  Location: [facility] OR;  Service: Orthopedics;  Laterality: Left;  LENGHT OF SURGERY: 90 MINUTES   TUMOR REMOVAL     not tumor, but aneurysm   VENTRICULOPERITONEAL SHUNT  10/17/2011   Procedure: SHUNT INSERTION VENTRICULAR-PERITONEAL;  Surgeon: Cristi Loron, MD;  Location: [facility];  Service: Neurosurgery;  Laterality: Right;  Instertion of Ventricular-Peritoneal Shunt   VENTRICULOPERITONEAL SHUNT     once at age 50 and replaced 2013.      Social History:   reports that he quit smoking about 11 years ago. He has never used smokeless tobacco. He reports that he does not drink alcohol and does not use drugs.   Family History:  His family history is negative for Colon cancer.   Allergies Allergies  Allergen Reactions   Tuberculin Purified Protein Derivative Other (See Comments)    "Allergic," per MAR   Tuberculin Tests Other (See Comments)    "Allergic," per [facility]     Home Medications  Prior to Admission medications   Medication Sig Start Date End Date Taking? Authorizing Provider  aspirin EC 81 MG tablet Take 81 mg by  mouth in the morning. Swallow whole.    [provider]  pravastatin (PRAVACHOL) 80 MG tablet Take 80 mg by mouth at bedtime.    [provider]  senna (SENOKOT) 8.6 MG TABS tablet Take 1 tablet by mouth at bedtime.    [provider]  tamsulosin (FLOMAX) 0.4 MG CAPS capsule Take 0.4 mg by mouth daily.    [provider]  traZODone (DESYREL) 50 MG tablet Take 25 mg by mouth at bedtime.    [provider]  Vitamin D, Ergocalciferol, (DRISDOL) 1.25 MG (50000 UNIT) CAPS capsule Take 50,000 Units by mouth See admin instructions. Take 50,000 units by mouth on the 7th of every month    [provider]     Critical care time: 50 minutes     CRITICAL CARE Performed by: Darcella Gasman Inez Stantz   Total critical care time: 50 minutes  Critical care time was exclusive of separately billable procedures and treating other patients.  Critical care was necessary to treat or prevent imminent or life-threatening deterioration.  Critical care was time spent personally by me on the following activities: development of treatment plan with patient and/or surrogate as well as nursing, discussions with consultants, evaluation of patient's response to treatment, examination of patient, obtaining history from patient or surrogate, ordering and performing treatments and  interventions, ordering and review of laboratory studies, ordering and review of radiographic studies, pulse oximetry and re-evaluation of patient's condition.     Darcella Gasman Rainna Nearhood, PA-C [facility] See Amion for pager If no response to pager , please call [phone] until 7pm After 7:00 pm call Elink  [phone]

## 2023-06-01 NOTE — ED Triage Notes (Signed)
 Pt BIB [facility] from [facility] nursing facility d/t weakness, hiccups, fever AMS that started yesterday. Pt has dementia at baseline, but is able to get up on himself . Unable to do so since last night. Pt has also been having consistent hiccups since last night. Pt's temp was 101.3 w EMS. Has chronic foley.

## 2023-06-01 NOTE — ED Notes (Signed)
 Reuel Boom Ramey [phone]

## 2023-06-01 NOTE — Progress Notes (Signed)
 Pharmacy Antibiotic Note  Kurt Walton is a 73 y.o. male admitted on 06/01/2023 with sepsis.  Pharmacy has been consulted for cefepime and vancomycin dosing. Notable AKI, Scr: 3.08, baseline around 1.   Plan: S/p 1750mg  vancomycin load, will not schedule regimen for now given significant AKI.  Cefepime 2g q24h.  Follow culture data for de-escalation.  Monitor renal function for dose adjustments as indicated.  F/u MRSA.   Height: 5\' 9"  (175.3 cm) Weight: 83.8 kg (184 lb 11.9 oz) IBW/kg (Calculated) : 70.7  Temp (24hrs), Avg:100.6 F (38.1 C), Min:100 F (37.8 C), Max:101.2 F (38.4 C)  Recent Labs  Lab 06/01/23 1600 06/01/23 1624  WBC 12.6*  --   CREATININE 3.08*  --   LATICACIDVEN  --  1.1    Estimated Creatinine Clearance: 21.4 mL/min (A) (by C-G formula based on SCr of 3.08 mg/dL (H)).    Allergies  Allergen Reactions   Tuberculin Purified Protein Derivative Other (See Comments)    "Allergic," per MAR   Tuberculin Tests Other (See Comments)    "Allergic," per [facility]    Antimicrobials this admission: Cefepime 3/9 >>  Vancomycin 3/9 >>   Microbiology results: 3/9 BCx:  3/9 UCx:   3/9 MRSA PCR:   Thank you for allowing pharmacy to be a part of this patient's care.  Estill Batten, PharmD, BCCCP  06/01/2023 9:35 PM

## 2023-06-02 DIAGNOSIS — N390 Urinary tract infection, site not specified: Secondary | ICD-10-CM | POA: Diagnosis not present

## 2023-06-02 DIAGNOSIS — E785 Hyperlipidemia, unspecified: Secondary | ICD-10-CM

## 2023-06-02 DIAGNOSIS — R6521 Severe sepsis with septic shock: Secondary | ICD-10-CM | POA: Diagnosis not present

## 2023-06-02 DIAGNOSIS — A4151 Sepsis due to Escherichia coli [E. coli]: Secondary | ICD-10-CM | POA: Diagnosis not present

## 2023-06-02 DIAGNOSIS — E46 Unspecified protein-calorie malnutrition: Secondary | ICD-10-CM

## 2023-06-02 DIAGNOSIS — N179 Acute kidney failure, unspecified: Secondary | ICD-10-CM | POA: Diagnosis not present

## 2023-06-02 LAB — GLUCOSE, CAPILLARY
Glucose-Capillary: 58 mg/dL — ABNORMAL LOW (ref 70–99)
Glucose-Capillary: 73 mg/dL (ref 70–99)
Glucose-Capillary: 73 mg/dL (ref 70–99)
Glucose-Capillary: 79 mg/dL (ref 70–99)
Glucose-Capillary: 84 mg/dL (ref 70–99)
Glucose-Capillary: 95 mg/dL (ref 70–99)

## 2023-06-02 LAB — URINE CULTURE

## 2023-06-02 LAB — BLOOD CULTURE ID PANEL (REFLEXED) - BCID2

## 2023-06-02 LAB — CBC
HCT: 28.3 % — ABNORMAL LOW (ref 39.0–52.0)
Hemoglobin: 8.8 g/dL — ABNORMAL LOW (ref 13.0–17.0)
MCH: 26 pg (ref 26.0–34.0)
MCHC: 31.1 g/dL (ref 30.0–36.0)
MCV: 83.5 fL (ref 80.0–100.0)
Platelets: 133 10*3/uL — ABNORMAL LOW (ref 150–400)
RBC: 3.39 MIL/uL — ABNORMAL LOW (ref 4.22–5.81)
RDW: 14.7 % (ref 11.5–15.5)
WBC: 9.9 10*3/uL (ref 4.0–10.5)
nRBC: 0 % (ref 0.0–0.2)

## 2023-06-02 LAB — CBG MONITORING, ED: Glucose-Capillary: 94 mg/dL (ref 70–99)

## 2023-06-02 LAB — BASIC METABOLIC PANEL WITH GFR
Anion gap: 12 (ref 5–15)
BUN: 51 mg/dL — ABNORMAL HIGH (ref 8–23)
CO2: 21 mmol/L — ABNORMAL LOW (ref 22–32)
Calcium: 7.1 mg/dL — ABNORMAL LOW (ref 8.9–10.3)
Chloride: 109 mmol/L (ref 98–111)
Creatinine, Ser: 2.64 mg/dL — ABNORMAL HIGH (ref 0.61–1.24)
GFR, Estimated: 25 mL/min — ABNORMAL LOW
Glucose, Bld: 103 mg/dL — ABNORMAL HIGH (ref 70–99)
Potassium: 3.6 mmol/L (ref 3.5–5.1)
Sodium: 142 mmol/L (ref 135–145)

## 2023-06-02 LAB — MRSA NEXT GEN BY PCR, NASAL: MRSA by PCR Next Gen: DETECTED — AB

## 2023-06-02 LAB — PHOSPHORUS: Phosphorus: 4.6 mg/dL (ref 2.5–4.6)

## 2023-06-02 LAB — MAGNESIUM: Magnesium: 2.3 mg/dL (ref 1.7–2.4)

## 2023-06-02 MED ORDER — ORAL CARE MOUTH RINSE: 15.0000 mL | OROMUCOSAL | Status: DC | PRN

## 2023-06-02 MED ORDER — MUPIROCIN 2 % EX OINT
1.0000 | TOPICAL_OINTMENT | Freq: Two times a day (BID) | CUTANEOUS | Status: AC
Start: 2023-06-02 — End: 2023-06-06
  Administered 2023-06-02 – 2023-06-06 (×10): 1 via NASAL
  Filled 2023-06-02 (×4): qty 22

## 2023-06-02 MED ORDER — SODIUM CHLORIDE 0.9 % IV SOLN
1.0000 g | Freq: Two times a day (BID) | INTRAVENOUS | Status: DC
  Administered 2023-06-02 – 2023-06-06 (×9): 1 g via INTRAVENOUS
  Filled 2023-06-02 (×9): qty 20

## 2023-06-02 MED ORDER — PRAVASTATIN SODIUM 40 MG PO TABS
80.0000 mg | ORAL_TABLET | Freq: Every day | ORAL | Status: DC
  Administered 2023-06-02 – 2023-06-08 (×7): 80 mg via ORAL
  Filled 2023-06-02 (×7): qty 2

## 2023-06-02 MED ORDER — ALBUMIN HUMAN 25 % IV SOLN
25.0000 g | Freq: Four times a day (QID) | INTRAVENOUS | Status: AC
  Administered 2023-06-02 – 2023-06-03 (×4): 25 g via INTRAVENOUS
  Filled 2023-06-02 (×4): qty 100

## 2023-06-02 MED ORDER — LACTATED RINGERS IV SOLN: INTRAVENOUS | Status: AC

## 2023-06-02 MED ORDER — CHLORHEXIDINE GLUCONATE CLOTH 2 % EX PADS
6.0000 | MEDICATED_PAD | Freq: Every day | CUTANEOUS | Status: AC
  Administered 2023-06-02 – 2023-06-06 (×5): 6 via TOPICAL

## 2023-06-02 NOTE — Progress Notes (Signed)
 PHARMACY - PHYSICIAN COMMUNICATION CRITICAL VALUE ALERT - BLOOD CULTURE IDENTIFICATION (BCID)  Kurt Walton is an 73 y.o. male who presented to [facility] on 06/01/2023 with a chief complaint of fever  Name of physician (or Provider) Contacted: Dr. Delia Chimes  Current antibiotics: Vancomycin, Cefepime  Changes to prescribed antibiotics recommended:  DC Vancomycin/Cefepime Start Merrem 1g IV q12h  Results for orders placed or performed during the hospital encounter of 06/01/23  Blood Culture ID Panel (Reflexed) (Collected: 06/01/2023  3:46 PM)  Result Value Ref Range   Enterococcus faecalis NOT DETECTED NOT DETECTED   Enterococcus Faecium NOT DETECTED NOT DETECTED   Listeria monocytogenes NOT DETECTED NOT DETECTED   Staphylococcus species NOT DETECTED NOT DETECTED   Staphylococcus aureus (BCID) NOT DETECTED NOT DETECTED   Staphylococcus epidermidis NOT DETECTED NOT DETECTED   Staphylococcus lugdunensis NOT DETECTED NOT DETECTED   Streptococcus species NOT DETECTED NOT DETECTED   Streptococcus agalactiae NOT DETECTED NOT DETECTED   Streptococcus pneumoniae NOT DETECTED NOT DETECTED   Streptococcus pyogenes NOT DETECTED NOT DETECTED   A.calcoaceticus-baumannii NOT DETECTED NOT DETECTED   Bacteroides fragilis NOT DETECTED NOT DETECTED   Enterobacterales DETECTED (A) NOT DETECTED   Enterobacter cloacae complex NOT DETECTED NOT DETECTED   Escherichia coli DETECTED (A) NOT DETECTED   Klebsiella aerogenes NOT DETECTED NOT DETECTED   Klebsiella oxytoca NOT DETECTED NOT DETECTED   Klebsiella pneumoniae NOT DETECTED NOT DETECTED   Proteus species NOT DETECTED NOT DETECTED   Salmonella species NOT DETECTED NOT DETECTED   Serratia marcescens NOT DETECTED NOT DETECTED   Haemophilus influenzae NOT DETECTED NOT DETECTED   Neisseria meningitidis NOT DETECTED NOT DETECTED   Pseudomonas aeruginosa NOT DETECTED NOT DETECTED   Stenotrophomonas maltophilia NOT DETECTED NOT DETECTED   Candida  albicans NOT DETECTED NOT DETECTED   Candida auris NOT DETECTED NOT DETECTED   Candida glabrata NOT DETECTED NOT DETECTED   Candida krusei NOT DETECTED NOT DETECTED   Candida parapsilosis NOT DETECTED NOT DETECTED   Candida tropicalis NOT DETECTED NOT DETECTED   Cryptococcus neoformans/gattii NOT DETECTED NOT DETECTED   CTX-M ESBL DETECTED (A) NOT DETECTED   Carbapenem resistance IMP NOT DETECTED NOT DETECTED   Carbapenem resistance KPC NOT DETECTED NOT DETECTED   Carbapenem resistance NDM NOT DETECTED NOT DETECTED   Carbapenem resist OXA 48 LIKE NOT DETECTED NOT DETECTED   Carbapenem resistance VIM NOT DETECTED NOT DETECTED   Abran Duke, PharmD, BCPS Clinical Pharmacist Phone: [phone]

## 2023-06-02 NOTE — Progress Notes (Signed)
 eLink Physician-Brief Progress Note Patient Name: Kurt Walton DOB: [birth year] MRN: [id number]   Date of Service  06/02/2023  HPI/Events of Note  73 year old with a history of hypertension, cerebral aneurysm, and CVA that was recently admitted with severe phimosis requiring indwelling Foley that returns to the emergency department with purulent discharge around the Foley and septic shock.  Vital signs within normal limits.  Previously on norepinephrine drip but this was since discontinued.  Metabolic panel with increased creatinine, improved leukocytosis, and normocytic anemia.  Urinalysis grossly positive.  CT abdomen/pelvis reviewed.  eICU Interventions  Given culture positivity for ESBL E. coli, transition to meropenem.  Maintain norepinephrine as needed for MAP greater than 65.  DVT prophylaxis with heparin GI prophylaxis not indicated     Intervention Category Evaluation Type: New Patient Evaluation  Jessy Calixte 06/02/2023, 6:12 AM

## 2023-06-02 NOTE — Progress Notes (Signed)
 Hypoglycemic Event  CBG: 58   Treatment: 4 oz juice/soda  Symptoms: None  Follow-up CBG: Time:1820  CBG Result:73`  Possible Reasons for Event: Inadequate meal intake  Comments/MD notified: transfer from ICU at 14:00, MD made aware    Kurt Walton

## 2023-06-02 NOTE — TOC Initial Note (Signed)
 Transition of Care [facility]) - Initial/Assessment Note    Patient Details  Name: Kurt Walton MRN: [id number] Date of Birth: 08/04/1950  Transition of Care [facility]) CM/SW Contact:    Gala Lewandowsky, RN Phone Number: 06/02/2023, 12:25 PM  Clinical Narrative: Patient was discussed in rounds this morning. Patient presented for weakness and fever (sepsis). PTA patient was from [facility] term. [facility] is [facility] and his CSW is Leighton Ruff @ [phone]. PCP is the [place] provider. CSW is aware that the patient will need to return once stable.   Expected Discharge Plan: Skilled Nursing Facility Barriers to Discharge: Continued Medical Work up   Expected Discharge Plan and Services In-house Referral: Clinical Social Work Discharge Planning Services: CM Consult Post Acute Care Choice: Skilled Nursing Facility Living arrangements for the past 2 months: Skilled Nursing Facility  Prior Living Arrangements/Services Living arrangements for the past 2 months: Skilled Nursing Facility Lives with:: Facility Resident Patient language and need for interpreter reviewed:: Yes        Need for Family Participation in Patient Care:  (SNF) Care giver support system in place?:  (SNF)   Criminal Activity/Legal Involvement Pertinent to Current Situation/Hospitalization: No - Comment as needed  Permission Sought/Granted Permission sought to share information with : Case Manager, Magazine features editor  Psych Involvement: No (comment)  Admission diagnosis:  Transaminitis [R74.01] AKI (acute kidney injury) (HCC) [N17.9] Sepsis (HCC) [A41.9] Severe sepsis (HCC) [A41.9, R65.20] Urinary tract infection associated with indwelling urethral catheter, initial encounter (HCC) [H08.657Q, N39.0] Patient Active Problem List   Diagnosis Date Noted   Septic shock (HCC) 06/01/2023   Obstructed, uropathy 05/05/2023   AKI (acute kidney injury)  (HCC) 05/04/2023   Hx of adenomatous colonic polyps 01/29/2016   Hydrocephalus (HCC) 10/18/2011   HTN (hypertension) 10/18/2011   PCP:  Jethro Bastos, MD Pharmacy:   [facility] - [place], [place] - [address] [place] [address] Phone: [phone] Fax: [phone]     Social Drivers of Health (SDOH) Social History: SDOH Screenings   Food Insecurity: No Food Insecurity (05/05/2023)  Housing: Unknown (05/05/2023)  Transportation Needs: No Transportation Needs (05/05/2023)  Utilities: Not At Risk (05/05/2023)  Social Connections: Patient Unable To Answer (05/05/2023)  Tobacco Use: Medium Risk (06/01/2023)   SDOH Interventions:     Readmission Risk Interventions     No data to display

## 2023-06-02 NOTE — Evaluation (Signed)
 Clinical/Bedside Swallow Evaluation Patient Details  Name: Kurt Walton MRN: [id number] Date of Birth: 10-14-1950  Today's Date: 06/02/2023 Time: SLP Start Time (ACUTE ONLY): 4098 SLP Stop Time (ACUTE ONLY): 1001 SLP Time Calculation (min) (ACUTE ONLY): 23 min  Past Medical History:  Past Medical History:  Diagnosis Date   Abnormal gait    Cerebral aneurysm    age 73 with VP shunt   Hard of hearing    Hx of adenomatous colonic polyps 01/29/2016   Hypertension    Substance abuse (HCC)    ETOH, past   Urinary incontinence    Past Surgical History:  Past Surgical History:  Procedure Laterality Date   HIP SURGERY     ORIF ANKLE FRACTURE Left 01/04/2021   Procedure: OPEN REDUCTION INTERNAL FIXATION (ORIF) ANKLE FRACTURE;  Surgeon: Terance Hart, MD;  Location: [facility] OR;  Service: Orthopedics;  Laterality: Left;  LENGHT OF SURGERY: 90 MINUTES   TUMOR REMOVAL     not tumor, but aneurysm   VENTRICULOPERITONEAL SHUNT  10/17/2011   Procedure: SHUNT INSERTION VENTRICULAR-PERITONEAL;  Surgeon: Cristi Loron, MD;  Location: [facility];  Service: Neurosurgery;  Laterality: Right;  Instertion of Ventricular-Peritoneal Shunt   VENTRICULOPERITONEAL SHUNT     once at age 57 and replaced 2013.   HPI:  Kurt Walton is a 73 y.o. M from [facility] admitted 3/9 with dx of sepsis secondary to ESBL UTI, AKI, hypokalemia. He had recent hospitalization after a fall and found to have bladder distension and severe phimosis, AKI and was discharged back with an indwelling foley.  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    Assessment / Plan / Recommendation  Clinical Impression  Pt presents with a functional swallow despite dementia dx. He is able to feed himself with physical assist with RUE (able to hold cup and feed himself cracker).  Demonstrates baseline left subtle facial asymmetry; tongue deviation to left s/p prior CVA.  Follows commands  intermittently.  Stated he wanted some water, but otherwise did not speak or answer questions. He demonstrated good oral attention, thorough mastication of solids with no residue post-swallow, the appearance of a brisk swallow response. He consumed three cups of water with supervision, using a straw, with no s/s of aspiration.  Recommend starting a regular diet, thin liquids. GIve meds whole in water - if he coughs, give whole in puree (no need to re-consult SLP). He will need assistance with self-feeding. No further SLP f/u is needed. Our service will sign off. SLP Visit Diagnosis: Dysphagia, unspecified (R13.10)    Aspiration Risk  No limitations    Diet Recommendation   Thin; regular solids  Medication Administration: Whole meds with liquid    Other  Recommendations Oral Care Recommendations: Oral care BID    Recommendations for follow up therapy are one component of a multi-disciplinary discharge planning process, led by the attending physician.  Recommendations may be updated based on patient status, additional functional criteria and insurance authorization.  Follow up Recommendations No SLP follow up        Swallow Study   General Date of Onset: 06/01/23 HPI: Kurt Walton is a 73 y.o. M from [facility] admitted 3/9 with dx of sepsis secondary to ESBL UTI, AKI, hypokalemia. He had recent hospitalization after a fall and found to have bladder distension and severe phimosis, AKI and was discharged back with an indwelling foley.  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  Type of Study: Bedside Swallow Evaluation Previous Swallow Assessment: no Diet Prior to this Study: NPO Temperature Spikes Noted: No Respiratory Status: Room air History of Recent Intubation: No Behavior/Cognition: Alert Oral Cavity Assessment: Within Functional Limits Oral Care Completed by SLP: Yes Oral Cavity - Dentition: Missing dentition Self-Feeding Abilities: Needs  assist Patient Positioning: Upright in bed Baseline Vocal Quality: Normal Volitional Cough: Cognitively unable to elicit Volitional Swallow: Unable to elicit    Oral/Motor/Sensory Function Overall Oral Motor/Sensory Function: Mild impairment Facial ROM: Reduced left;Suspected CN VII (facial) dysfunction Facial Symmetry: Abnormal symmetry right;Suspected CN VII (facial) dysfunction Lingual Symmetry: Abnormal symmetry left;Suspected CN XII (hypoglossal) dysfunction   Ice Chips Ice chips: Within functional limits   Thin Liquid Thin Liquid: Within functional limits    Nectar Thick Nectar Thick Liquid: Not tested   Honey Thick Honey Thick Liquid: Not tested   Puree Puree: Within functional limits   Solid     Solid: Within functional limits      Blenda Mounts Laurice 06/02/2023,10:08 AM  Marchelle Folks L. Samson Frederic, MA CCC/SLP Clinical Specialist - Acute Care SLP Acute Rehabilitation Services Office number [phone]

## 2023-06-02 NOTE — Progress Notes (Signed)
 PCCM Progress Note   Notified by outpatient Urology team that patient was recently seen in the outpatient setting 05/30/23 for a Foley catheter exchange and a routine urine culture was obtained and results are as follows      It appears this urine culture correlates to the bacteremia seen on admission.   Continue meropenum as it appear sensitive   Miaa Latterell D. Harris, NP-C [facility] Personal contact information can be found on Amion  If no contact or response made please call [phone] 06/02/2023, 6:16 PM

## 2023-06-02 NOTE — Progress Notes (Signed)
 NAME:  Kurt Walton, MRN:  [id number], DOB:  04/26/1950, LOS: 1 ADMISSION DATE:  06/01/2023, CONSULTATION DATE:  06/02/23 REFERRING MD:  EDP, CHIEF COMPLAINT:  sepsis   History of Present Illness:  Kurt Walton is a 73 y.o. 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  He was brought back to the ED 3/9 with worsening mental status a fever.  Purulent discharge around foley noted.  Work-up was significant for hgb, leuks and bacteria on UA, WBC 12.6, lactic acid 1.1.  His foley was exchanged and he was given 3L IVF with persistent hypotension, so critical care was consulted as he required Levophed to maintain adequate MAP's.   Pertinent  Medical History   has a past medical history of Abnormal gait, Cerebral aneurysm, Hard of hearing, adenomatous colonic polyps (01/29/2016), Hypertension, Substance abuse (HCC), and Urinary incontinence.'  Significant Hospital Events: Including procedures, antibiotic start and stop dates in addition to other pertinent events   3/9 admit for sepsis on low dose levophed  3/10 off Levophed this a.m., remains sleepy but will arouse to verbal stimuli.  Blood cultures positive for ESBL E. coli  Interim History / Subjective:  Seen lying in bed in no acute distress  Objective   Blood pressure 104/77, pulse 73, temperature 98.6 F (37 C), temperature source Oral, resp. rate 18, height 5\' 9"  (1.753 m), weight 83.8 kg, SpO2 100%.        Intake/Output Summary (Last 24 hours) at 06/02/2023 0818 Last data filed at 06/02/2023 6213 Gross per 24 hour  Intake 2098.93 ml  Output 3100 ml  Net -1001.07 ml   Filed Weights   06/01/23 1916  Weight: 83.8 kg   Physical Exam  General: Acute on chronic ill-appearing deconditioned elderly male lying in bed  in no acute distress HEENT: [place]/AT, MM pink/moist, PERRL,  Neuro: Sleepy but will respond to verbal stimuli, weak CV: s1s2 regular rate and rhythm, no murmur, rubs, or gallops,  PULM: Diminished bilaterally, no increased work of breathing, no added breath sounds GI: soft, bowel sounds active in all 4 quadrants, non-tender, non-distended Extremities: warm/dry, no edema  Skin: no rashes or lesions  Resolved Hospital Problem list     Assessment & Plan:   Septic shock secondary to ESBL UTI with bacteremia  -In the setting of indwelling catheter and recent Providencia Stuartii UTI 4 weeks ago, sensitive to Cefepime -Foley exchanged on admission P: Antibiotics broadened to meropenem given ESBL bacteremia, continue Follow susceptibilities Continue home Flomax  AKI Hypokalemia Significant phimosis  P: Follow renal function  Monitor urine output Trend Bmet Avoid nephrotoxins Ensure adequate renal perfusion   Mildly elevated transaminases  P: Intermittently trend LFTs Avoid hepatotoxins  Baseline Dementia -pleasantly confused, unclear what his baseline is P: Delirium precautions Aspiration precautions SLP as below  HLD P: Continue home pravastatin  Protein calorie malnutrition Hypoalbuminemia P: SLP Advance diet as able Supplemental IV albumin Consult RD  Patient stable to transfer out of ICU, will ask hospitalist service to take over starting 3/11  Best Practice (right click and "Reselect all SmartList Selections" daily)   Diet/type: NPO DVT prophylaxis prophylactic heparin  Pressure ulcer(s): N/A GI prophylaxis: N/A Lines: N/A Foley:  Yes, and it is still needed Code Status:  full code Last date of multidisciplinary goals of  care discussion [pending]  Critical care time: NA  Kurt Kathan D. Harris, NP-C [facility] Personal contact information can be found on Amion  If no contact or response made please call [phone] 06/02/2023, 8:31  AM

## 2023-06-03 DIAGNOSIS — Z1612 Extended spectrum beta lactamase (ESBL) resistance: Secondary | ICD-10-CM

## 2023-06-03 DIAGNOSIS — A419 Sepsis, unspecified organism: Secondary | ICD-10-CM | POA: Diagnosis not present

## 2023-06-03 DIAGNOSIS — B9620 Unspecified Escherichia coli [E. coli] as the cause of diseases classified elsewhere: Secondary | ICD-10-CM | POA: Diagnosis not present

## 2023-06-03 DIAGNOSIS — N471 Phimosis: Secondary | ICD-10-CM

## 2023-06-03 DIAGNOSIS — R6521 Severe sepsis with septic shock: Secondary | ICD-10-CM | POA: Diagnosis not present

## 2023-06-03 LAB — GLUCOSE, CAPILLARY
Glucose-Capillary: 101 mg/dL — ABNORMAL HIGH (ref 70–99)
Glucose-Capillary: 104 mg/dL — ABNORMAL HIGH (ref 70–99)
Glucose-Capillary: 113 mg/dL — ABNORMAL HIGH (ref 70–99)
Glucose-Capillary: 78 mg/dL (ref 70–99)
Glucose-Capillary: 87 mg/dL (ref 70–99)
Glucose-Capillary: 94 mg/dL (ref 70–99)

## 2023-06-03 LAB — CBC WITH DIFFERENTIAL/PLATELET
Abs Immature Granulocytes: 0.08 10*3/uL — ABNORMAL HIGH (ref 0.00–0.07)
Basophils Absolute: 0 10*3/uL (ref 0.0–0.1)
Basophils Relative: 0 %
Eosinophils Absolute: 0 10*3/uL (ref 0.0–0.5)
Eosinophils Relative: 0 %
HCT: 26.5 % — ABNORMAL LOW (ref 39.0–52.0)
Hemoglobin: 8.6 g/dL — ABNORMAL LOW (ref 13.0–17.0)
Immature Granulocytes: 1 %
Lymphocytes Relative: 16 %
Lymphs Abs: 1.3 10*3/uL (ref 0.7–4.0)
MCH: 26.2 pg (ref 26.0–34.0)
MCHC: 32.5 g/dL (ref 30.0–36.0)
MCV: 80.8 fL (ref 80.0–100.0)
Monocytes Absolute: 0.6 10*3/uL (ref 0.1–1.0)
Monocytes Relative: 7 %
Neutro Abs: 6 10*3/uL (ref 1.7–7.7)
Neutrophils Relative %: 76 %
Platelets: 140 10*3/uL — ABNORMAL LOW (ref 150–400)
RBC: 3.28 MIL/uL — ABNORMAL LOW (ref 4.22–5.81)
RDW: 14.7 % (ref 11.5–15.5)
Smear Review: NORMAL
WBC: 8 10*3/uL (ref 4.0–10.5)
nRBC: 0 % (ref 0.0–0.2)

## 2023-06-03 LAB — BRAIN NATRIURETIC PEPTIDE: B Natriuretic Peptide: 700 pg/mL — ABNORMAL HIGH (ref 0.0–100.0)

## 2023-06-03 LAB — BASIC METABOLIC PANEL
Anion gap: 6 (ref 5–15)
BUN: 42 mg/dL — ABNORMAL HIGH (ref 8–23)
CO2: 28 mmol/L (ref 22–32)
Calcium: 7.9 mg/dL — ABNORMAL LOW (ref 8.9–10.3)
Chloride: 111 mmol/L (ref 98–111)
Creatinine, Ser: 2.08 mg/dL — ABNORMAL HIGH (ref 0.61–1.24)
GFR, Estimated: 33 mL/min — ABNORMAL LOW (ref >60)
Glucose, Bld: 100 mg/dL — ABNORMAL HIGH (ref 70–99)
Potassium: 3.4 mmol/L — ABNORMAL LOW (ref 3.5–5.1)
Sodium: 145 mmol/L (ref 135–145)

## 2023-06-03 LAB — C-REACTIVE PROTEIN: CRP: 14.2 mg/dL — ABNORMAL HIGH (ref <1.0)

## 2023-06-03 LAB — PROCALCITONIN: Procalcitonin: 19.48 ng/mL

## 2023-06-03 LAB — MAGNESIUM: Magnesium: 2.4 mg/dL (ref 1.7–2.4)

## 2023-06-03 MED ORDER — POTASSIUM CHLORIDE CRYS ER 20 MEQ PO TBCR
40.0000 meq | EXTENDED_RELEASE_TABLET | Freq: Once | ORAL | Status: AC
  Administered 2023-06-03: 40 meq via ORAL
  Filled 2023-06-03: qty 2

## 2023-06-03 MED ORDER — TAMSULOSIN HCL 0.4 MG PO CAPS
0.4000 mg | ORAL_CAPSULE | Freq: Every day | ORAL | Status: DC
  Administered 2023-06-03 – 2023-06-09 (×7): 0.4 mg via ORAL
  Filled 2023-06-03 (×7): qty 1

## 2023-06-03 MED ORDER — ASPIRIN 81 MG PO TBEC
81.0000 mg | DELAYED_RELEASE_TABLET | Freq: Every day | ORAL | Status: DC
  Administered 2023-06-03 – 2023-06-09 (×7): 81 mg via ORAL
  Filled 2023-06-03 (×7): qty 1

## 2023-06-03 NOTE — Plan of Care (Signed)

## 2023-06-03 NOTE — Plan of Care (Signed)
 Plan of care is reviewed.  Problem: Fluid Volume: Goal: Ability to maintain a balanced intake and output will improve Outcome: Progressing- adequate oral intake, IV hydration and good urine output.    Problem: Metabolic: monitor CBG q 4 hrs.  Goal: Ability to maintain appropriate glucose levels will improve Outcome: Progressing-  CBG is in normal range tonight.   Problem: Nutritional: Pt has low appetite. He refused to eat his dinner yesterday. He requested to drink cranberry juice last night. He had adequate oral fluid intake.  Outcome: Progressing.  Goal: Progress toward achieving an optimal weight will improve Outcome: Progressing   Problem: Clinical Measurements: Goal: Ability to maintain clinical measurements within normal limits will improve Outcome: Progressing Goal: Will remain free from infection Outcome: Progressing- afebrile, under contact precaution from ESBL bacteriemia. He has been getting Meropenem IV q 12 hrs.  Goal: Respiratory complications will improve Outcome: Progressing- on room air, no respiratory distress. Goal: Cardiovascular complication will be avoided Outcome: Progressing- stable hemodynamically, NSR on the monitor.    Problem: Skin Integrity: Goal: Risk for impaired skin integrity will decrease Outcome: Progressing- normal skin integrity, no pressure ulcer. Promoting position changed q 2 hrs.    Filiberto Pinks, RN

## 2023-06-03 NOTE — Consult Note (Signed)
 [facility]    Date of Admission:  06/01/2023   Total days of inpatient antibiotics 3        Reason for Consult: ESBL E. coli bacteremia    Principal Problem:   Septic shock [facility])   Assessment: 73 year old male with history of dementia, CVA with residual hemiparesis with recent hospitalization following a fall found to have bladder distention with severe phimosis discharged back with indwelling Foley return to the ED with fever and altered mental status found to have: #ESBL E. coli bacteremia secondary to UTI #AKI, hypokalemia, severe phimosis --Patient noted to have brown discharge from Foley.  On arrival he had a temp of 1-2.4 WBC 12K - CT abdomen pelvis showed fluid-filled distal colon keeping with diarrheal state.  Full placement of Foley catheter with decompression of bladder, prostatic hypertrophy.  Chronic bladder outlet obstruction. - Blood cultures from admission grew 2/2 ESBL E. coli.  Urine cultures growing 100 K colonies of Staph aureus, 70K GNR which I suspect is E. coli. Recommendations:  -Continue meropenem to complete 7 days of antibiotics EOT 3/15 - Staph aureus in urine is likely contaminant given there is no bacteremia.  No overt signs of joint pain.  In addition, urine cultures were obtained prior to Foley catheter being changed. -Agree with urology follow-up  Evaluation of this patient requires complex antimicrobial therapy evaluation and counseling + isolation needs for disease transmission risk assessment and mitigation   Microbiology:   Antibiotics: Meropenem 3/9-present Metronidazole 3/9 Vancomycin 3/9 Cefepime 3/9  Cultures: Blood 3/9 2/2 esbl ecoli Urine 3/9 >100K Staph aureus. 70k gnr Other   HPI: Kurt Walton is a 73 y.o. male with past medical history of hypertension, cerebral aneurysm, CVA, residual hemiparesis, dementia recent hospitalization after a fall found to have bladder distention and AKI  discharged back with indwelling Foley brought back to the ED with worsening mental status and fever.  On arrival he had a temp 102.4, WBC 12K.  Blood cultures grew ESBL E. coli.  Urine cultures are growing GNR and Staph aureus.  Cultures from urine were taken from Foley catheter.   Review of Systems: Review of Systems  All other systems reviewed and are negative.   Past Medical History:  Diagnosis Date   Abnormal gait    Cerebral aneurysm    age 34 with VP shunt   Hard of hearing    Hx of adenomatous colonic polyps 01/29/2016   Hypertension    Substance abuse (HCC)    ETOH, past   Urinary incontinence     Social History   Tobacco Use   Smoking status: Former    Current packs/day: 0.00    Types: Cigarettes    Quit date: 11/11/2011    Years since quitting: 11.5   Smokeless tobacco: Never  Substance Use Topics   Alcohol use: No    Comment: hx of ETOH abuse   Drug use: No    Family History  Problem Relation Age of Onset   Colon cancer Neg Hx    Scheduled Meds:  aspirin EC  81 mg Oral Daily   Chlorhexidine Gluconate Cloth  6 each Topical Daily   heparin  5,000 Units Subcutaneous Q8H   insulin aspart  0-15 Units Subcutaneous Q4H   mupirocin ointment  1 Application Nasal BID   pravastatin  80 mg Oral QHS   tamsulosin  0.4 mg Oral Daily   Continuous Infusions:  meropenem (MERREM) IV  1 g (06/03/23 2038)   PRN Meds:.acetaminophen, docusate sodium, mouth rinse, polyethylene glycol Allergies  Allergen Reactions   Tuberculin Purified Protein Derivative Other (See Comments)    "Allergic," per MAR   Tuberculin Tests Other (See Comments)    "Allergic," per MAR    OBJECTIVE: Blood pressure 117/76, pulse 78, temperature 98.7 F (37.1 C), temperature source Oral, resp. rate (!) 22, height 5\' 9"  (1.753 m), weight 80.5 kg, SpO2 99%.  Physical Exam Constitutional:      General: He is not in acute distress.    Appearance: He is normal weight. He is not toxic-appearing.   HENT:     Head: Normocephalic and atraumatic.     Right Ear: External ear normal.     Left Ear: External ear normal.     Nose: No congestion or rhinorrhea.     Mouth/Throat:     Mouth: Mucous membranes are moist.     Pharynx: Oropharynx is clear.  Eyes:     Extraocular Movements: Extraocular movements intact.     Conjunctiva/sclera: Conjunctivae normal.     Pupils: Pupils are equal, round, and reactive to light.  Cardiovascular:     Rate and Rhythm: Normal rate and regular rhythm.     Heart sounds: No murmur heard.    No friction rub. No gallop.  Pulmonary:     Effort: Pulmonary effort is normal.     Breath sounds: Normal breath sounds.  Abdominal:     General: Abdomen is flat. Bowel sounds are normal.     Palpations: Abdomen is soft.  Musculoskeletal:        General: No swelling.     Cervical back: Normal range of motion and neck supple.  Skin:    General: Skin is warm and dry.  Neurological:     General: No focal deficit present.  Psychiatric:        Mood and Affect: Mood normal.     Lab Results Lab Results  Component Value Date   WBC 8.0 06/03/2023   HGB 8.6 (L) 06/03/2023   HCT 26.5 (L) 06/03/2023   MCV 80.8 06/03/2023   PLT 140 (L) 06/03/2023    Lab Results  Component Value Date   CREATININE 2.08 (H) 06/03/2023   BUN 42 (H) 06/03/2023   NA 145 06/03/2023   K 3.4 (L) 06/03/2023   CL 111 06/03/2023   CO2 28 06/03/2023    Lab Results  Component Value Date   ALT 50 (H) 06/01/2023   AST 82 (H) 06/01/2023   ALKPHOS 99 06/01/2023   BILITOT 0.9 06/01/2023       Danelle Earthly, MD [facility] 06/03/2023, 10:59 PM

## 2023-06-03 NOTE — Evaluation (Signed)
 Occupational Therapy Evaluation Patient Details Name: Kurt Walton MRN: [id number] DOB: Jan 13, 1951 Today's Date: 06/03/2023   History of Present Illness   Pt is a 73 y/o male presenting on 3/9 from [facility] with AMS and weakness.  Noted recent hospital stay 04/2023 after fall with AKI, bladder distention and foley placed. Admitted with sepsis from UTI. PMH includes HTN, cerebral aneurysm, CVA with residual L hemiparesis, dementia.     Clinical Impressions Patient admitted for above and presents with problem list below.  He is from [place] and is a poor historian. Pt reports needing assist for transfer into wheelchair, but does not confirm ADL status.  Per chart review, pt able to feed self.  Today, he requires mod assist +2 for bed mobility and unable to progress transfers due to min to max assist for sitting balance required.  He min to total assist +2 for ADLs, able to wash face with R hand.  Based on performance today, will defer further OT services to SNF in order to optimize independence and safety with ADLs and transfers.  No further acute OT needs identified today.        If plan is discharge home, recommend the following:   A little help with walking and/or transfers;A little help with bathing/dressing/bathroom     Functional Status Assessment         Equipment Recommendations   None recommended by OT     Recommendations for Other Services         Precautions/Restrictions   Precautions Precautions: Fall Recall of Precautions/Restrictions: Impaired Restrictions Weight Bearing Restrictions Per Provider Order: No     Mobility Bed Mobility Overal bed mobility: Needs Assistance Bed Mobility: Supine to Sit, Sit to Supine     Supine to sit: Mod assist, +2 for physical assistance, HOB elevated Sit to supine: Mod assist, +2 for physical assistance        Transfers                   General transfer comment: did not attempt due to  poor sitting balance      Balance Overall balance assessment: Needs assistance Sitting-balance support: Feet supported Sitting balance-Leahy Scale: Poor Sitting balance - Comments: min guard at best, preference to R UE support.  Requires up to max assist dynamically Postural control: Posterior lean                                 ADL either performed or assessed with clinical judgement   ADL Overall ADL's : Needs assistance/impaired     Grooming: Moderate assistance;Sitting           Upper Body Dressing : Maximal assistance;Sitting   Lower Body Dressing: Total assistance;+2 for physical assistance;Bed level     Toilet Transfer Details (indicate cue type and reason): deferred         Functional mobility during ADLs: Moderate assistance;+2 for physical assistance       Vision   Additional Comments: NT     Perception         Praxis         Pertinent Vitals/Pain Pain Assessment Pain Assessment: PAINAD Breathing: normal Negative Vocalization: none Facial Expression: smiling or inexpressive Body Language: relaxed Consolability: no need to console PAINAD Score: 0 Pain Intervention(s): Monitored during session     Extremity/Trunk Assessment Upper Extremity Assessment Upper Extremity Assessment: Right hand dominant;Generalized weakness;LUE deficits/detail  LUE Deficits / Details: hx of CVA, limited functional use   Lower Extremity Assessment Lower Extremity Assessment: Defer to PT evaluation   Cervical / Trunk Assessment Cervical / Trunk Assessment: Normal   Communication Communication Communication: Impaired Factors Affecting Communication: Difficulty expressing self   Cognition Arousal: Alert Behavior During Therapy: WFL for tasks assessed/performed Cognition: History of cognitive impairments                               Following commands: Impaired Following commands impaired: Follows one step commands with increased  time, Follows one step commands inconsistently     Cueing  General Comments   Cueing Techniques: Verbal cues;Gestural cues      Exercises     Shoulder Instructions      Home Living Family/patient expects to be discharged to:: Skilled nursing facility                                        Prior Functioning/Environment Prior Level of Function : Needs assist             Mobility Comments: wheelchair bound at SNF. ADLs Comments: poor historian, difficult to determine if pt managing grooming/self feeding    OT Problem List: Decreased strength;Decreased activity tolerance;Decreased range of motion;Impaired balance (sitting and/or standing);Impaired UE functional use;Decreased cognition;Decreased safety awareness;Decreased coordination   OT Treatment/Interventions:        OT Goals(Current goals can be found in the care plan section)   Acute Rehab OT Goals Patient Stated Goal: none stated OT Goal Formulation: Patient unable to participate in goal setting   OT Frequency:       Co-evaluation PT/OT/SLP Co-Evaluation/Treatment: Yes Reason for Co-Treatment: For patient/therapist safety;Necessary to address cognition/behavior during functional activity;To address functional/ADL transfers PT goals addressed during session: Mobility/safety with mobility OT goals addressed during session: ADL's and self-care      AM-PAC OT "6 Clicks" Daily Activity     Outcome Measure Help from another person eating meals?: A Lot Help from another person taking care of personal grooming?: A Lot Help from another person toileting, which includes using toliet, bedpan, or urinal?: Total Help from another person bathing (including washing, rinsing, drying)?: A Lot Help from another person to put on and taking off regular upper body clothing?: A Lot Help from another person to put on and taking off regular lower body clothing?: Total 6 Click Score: 10   End of Session Nurse  Communication: Mobility status  Activity Tolerance: Patient tolerated treatment well Patient left: in bed;with call bell/phone within reach;with bed alarm set  OT Visit Diagnosis: Other abnormalities of gait and mobility (R26.89);History of falling (Z91.81)                Time: 6045-4098 OT Time Calculation (min): 18 min Charges:  OT General Charges $OT Visit: 1 Visit OT Evaluation $OT Eval Moderate Complexity: 1 Mod  Barry Brunner, OT Acute Rehabilitation Services Office [phone] Secure Chat Preferred    Chancy Milroy 06/03/2023, 12:20 PM

## 2023-06-03 NOTE — Evaluation (Signed)
 Clinical/Bedside Swallow Evaluation Patient Details  Name: Kurt Walton MRN: [id number] Date of Birth: June 25, 1950  Today's Date: 06/03/2023 Time: SLP Start Time (ACUTE ONLY): 7425 SLP Stop Time (ACUTE ONLY): 0959 SLP Time Calculation (min) (ACUTE ONLY): 8 min  Past Medical History:  Past Medical History:  Diagnosis Date   Abnormal gait    Cerebral aneurysm    age 73 with VP shunt   Hard of hearing    Hx of adenomatous colonic polyps 01/29/2016   Hypertension    Substance abuse (HCC)    ETOH, past   Urinary incontinence    Past Surgical History:  Past Surgical History:  Procedure Laterality Date   HIP SURGERY     ORIF ANKLE FRACTURE Left 01/04/2021   Procedure: OPEN REDUCTION INTERNAL FIXATION (ORIF) ANKLE FRACTURE;  Surgeon: Terance Hart, MD;  Location: [facility] OR;  Service: Orthopedics;  Laterality: Left;  LENGHT OF SURGERY: 90 MINUTES   TUMOR REMOVAL     not tumor, but aneurysm   VENTRICULOPERITONEAL SHUNT  10/17/2011   Procedure: SHUNT INSERTION VENTRICULAR-PERITONEAL;  Surgeon: Cristi Loron, MD;  Location: [facility] OR[facility];  Service: Neurosurgery;  Laterality: Right;  Instertion of Ventricular-Peritoneal Shunt   VENTRICULOPERITONEAL SHUNT     once at age 65 and replaced 2013.   HPI:  Nas Wafer is a 73 y.o. M from [facility] admitted 3/9 with dx of sepsis secondary to ESBL UTI, AKI, hypokalemia. He had recent hospitalization after a fall and found to have bladder distension and severe phimosis, AKI and was discharged back with an indwelling foley. Seen by SLP 06/02/23 with recommendations to continue regular/thin diet, reordered 3/11 due to confusion. 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    Assessment / Plan / Recommendation  Clinical Impression  Pt exhibits baseline subtle L facial asymmetry and L lingual deviation s/p prior CVA. He took large, successive sips of water via straw without signs clinically concerning for  aspiration. He masticates solids thoroughly, achieving complete oral clearance. Discussed with RN, who states pt was observed to have mild R pocketing but responded to cueing to clear using a lingual sweep or liquid wash. Ultimately, pt demonstrates the ability to continue a regular diet with thin liquids if provided adequate supervision to cue him for mild oral deficits which are suspected to be secondary to cognitive factors. Ongoing SLP f/u is not warranted acutely, will sign off. SLP Visit Diagnosis: Dysphagia, unspecified (R13.10)    Aspiration Risk  No limitations    Diet Recommendation Regular;Thin liquid    Liquid Administration via: Cup;Straw Medication Administration: Whole meds with liquid Supervision: Staff to assist with self feeding;Full supervision/cueing for compensatory strategies Compensations: Minimize environmental distractions;Slow rate;Small sips/bites;Lingual sweep for clearance of pocketing Postural Changes: Seated upright at 90 degrees    Other  Recommendations Oral Care Recommendations: Oral care BID    Recommendations for follow up therapy are one component of a multi-disciplinary discharge planning process, led by the attending physician.  Recommendations may be updated based on patient status, additional functional criteria and insurance authorization.  Follow up Recommendations No SLP follow up      Assistance Recommended at Discharge    Functional Status Assessment Patient has not had a recent decline in their functional status  Frequency and Duration            Prognosis Prognosis for improved oropharyngeal function: Good Barriers to Reach Goals: Cognitive deficits      Swallow Study  General HPI: Kurt Walton is a 73 y.o. M from [facility] admitted 3/9 with dx of sepsis secondary to ESBL UTI, AKI, hypokalemia. He had recent hospitalization after a fall and found to have bladder distension and severe phimosis, AKI and was discharged back with  an indwelling foley. Seen by SLP 06/02/23 with recommendations to continue regular/thin diet, reordered 3/11 due to confusion. 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 Type of Study: Bedside Swallow Evaluation Previous Swallow Assessment: see HPI Diet Prior to this Study: Regular;Thin liquids (Level 0) Temperature Spikes Noted: No Respiratory Status: Room air History of Recent Intubation: No Behavior/Cognition: Alert Oral Cavity Assessment: Within Functional Limits Oral Care Completed by SLP: Yes Oral Cavity - Dentition: Missing dentition Vision: Functional for self-feeding Self-Feeding Abilities: Needs assist Patient Positioning: Upright in bed Baseline Vocal Quality: Normal Volitional Cough: Cognitively unable to elicit Volitional Swallow: Unable to elicit    Oral/Motor/Sensory Function Overall Oral Motor/Sensory Function: Mild impairment Facial ROM: Reduced left;Suspected CN VII (facial) dysfunction Facial Symmetry: Abnormal symmetry right;Suspected CN VII (facial) dysfunction Lingual Symmetry: Abnormal symmetry left;Suspected CN XII (hypoglossal) dysfunction   Ice Chips Ice chips: Not tested   Thin Liquid Thin Liquid: Within functional limits Presentation: Straw    Nectar Thick Nectar Thick Liquid: Not tested   Honey Thick Honey Thick Liquid: Not tested   Puree Puree: Not tested   Solid     Solid: Within functional limits      Gwynneth Aliment, M.A., CF-SLP Speech Language Pathology, Acute Rehabilitation Services  Secure Chat preferred [phone]  06/03/2023,10:24 AM

## 2023-06-03 NOTE — [place] FL2 (Signed)
  MEDICAID FL2 LEVEL OF CARE FORM     IDENTIFICATION  Patient Name: Kurt Walton Birthdate: 04-Aug-1950 Sex: male Admission Date (Current Location): 06/01/2023  [facility] and [place] Number:  Producer, television/film/video and Address:  [facility], [address] [place], [place] [address]      Provider Number: [id number]  Attending Physician Name and Address:  Leroy Sea, MD  Relative Name and Phone Number:       Current Level of Care: Hospital Recommended Level of Care: Skilled Nursing Facility Prior Approval Number:    Date Approved/Denied:   PASRR Number: [id number]  Discharge Plan: SNF    Current Diagnoses: Patient Active Problem List   Diagnosis Date Noted   Septic shock (HCC) 06/01/2023   Obstructed, uropathy 05/05/2023   AKI (acute kidney injury) (HCC) 05/04/2023   Hx of adenomatous colonic polyps 01/29/2016   Hydrocephalus (HCC) 10/18/2011   HTN (hypertension) 10/18/2011    Orientation RESPIRATION BLADDER Height & Weight     Self  Normal Incontinent, Indwelling catheter Weight: 181 lb 3.5 oz (82.2 kg) Height:  5\' 9"  (175.3 cm)  BEHAVIORAL SYMPTOMS/MOOD NEUROLOGICAL BOWEL NUTRITION STATUS      Incontinent Diet (See dc summary)  AMBULATORY STATUS COMMUNICATION OF NEEDS Skin   Extensive Assist Verbally Normal                       Personal Care Assistance Level of Assistance  Bathing, Feeding, Dressing Bathing Assistance: Maximum assistance Feeding assistance: Maximum assistance Dressing Assistance: Maximum assistance     Functional Limitations Info             SPECIAL CARE FACTORS FREQUENCY                       Contractures Contractures Info: Not present    Additional Factors Info  Code Status, Allergies Code Status Info: Full Allergies Info: Tuberculin Purified Protein Derivative, Tuberculin Tests           Current Medications (06/03/2023):  This is the current hospital active  medication list Current Facility-Administered Medications  Medication Dose Route Frequency Provider Last Rate Last Admin   acetaminophen (TYLENOL) tablet 650 mg  650 mg Oral Q6H PRN Rozann Lesches, MD   650 mg at 06/01/23 2239   Chlorhexidine Gluconate Cloth 2 % PADS 6 each  6 each Topical Daily Rozann Lesches, MD   6 each at 06/02/23 0932   docusate sodium (COLACE) capsule 100 mg  100 mg Oral BID PRN Gleason, Darcella Gasman, PA-C       heparin injection 5,000 Units  5,000 Units Subcutaneous Q8H Gleason, Darcella Gasman, PA-C   5,000 Units at 06/03/23 2841   insulin aspart (novoLOG) injection 0-15 Units  0-15 Units Subcutaneous Q4H Gleason, Darcella Gasman, PA-C       lactated ringers infusion   Intravenous Continuous Lorin Glass, MD 40 mL/hr at 06/02/23 1429 New Bag at 06/02/23 1429   meropenem (MERREM) 1 g in sodium chloride 0.9 % 100 mL IVPB  1 g Intravenous Q12H Javonne, Dorko, RPH 200 mL/hr at 06/02/23 2223 1 g at 06/02/23 2223   mupirocin ointment (BACTROBAN) 2 % 1 Application  1 Application Nasal BID Rozann Lesches, MD   1 Application at 06/02/23 2133   Oral care mouth rinse  15 mL Mouth Rinse PRN Rozann Lesches, MD       polyethylene glycol (MIRALAX / GLYCOLAX) packet 17  g  17 g Oral Daily PRN Gleason, Darcella Gasman, PA-C       pravastatin (PRAVACHOL) tablet 80 mg  80 mg Oral QHS Gleason, Darcella Gasman, PA-C   80 mg at 06/02/23 2133     Discharge Medications: Please see discharge summary for a list of discharge medications.  Relevant Imaging Results:  Relevant Lab Results:   Additional Information SSN:[id number]  Mearl Latin, LCSW

## 2023-06-03 NOTE — Evaluation (Signed)
 Physical Therapy Evaluation Patient Details Name: Kurt Walton MRN: [id number] DOB: 06-Jan-1951 Today's Date: 06/03/2023  History of Present Illness  Kurt Walton is a 73 y.o. M from [place] with PMH significant for HTN, cerebral aneurysm and CVA with residual hemiparesis, dementia and recent hospitalization after a fall and found to have bladder distension and severe phimosis and AKI and was discharged back with an indwelling foley.  He was brought back to the ED 3/9 with worsening mental status a fever.   Clinical Impression  Patient received in bed sleeping. He wakes to my touch. Patient has difficulty expressing himself fully, but does converse and make needs known. Patient required mod +2 assist to perform bed mobility. Poor sitting balance. Cues needed to lean forward. Transfer not attempted at this time. He will continue to benefit from skilled PT to improve functional independence.           If plan is discharge home, recommend the following: A lot of help with walking and/or transfers;A lot of help with bathing/dressing/bathroom;Assistance with feeding;Direct supervision/assist for medications management;Assistance with cooking/housework;Direct supervision/assist for financial management   Can travel by private vehicle    no    Equipment Recommendations None recommended by PT  Recommendations for Other Services       Functional Status Assessment Patient has had a recent decline in their functional status and demonstrates the ability to make significant improvements in function in a reasonable and predictable amount of time.     Precautions / Restrictions Precautions Precautions: Fall Recall of Precautions/Restrictions: Impaired Restrictions Weight Bearing Restrictions Per Provider Order: No      Mobility  Bed Mobility Overal bed mobility: Needs Assistance Bed Mobility: Supine to Sit, Sit to Supine     Supine to sit: Mod assist, +2 for physical assistance,  HOB elevated Sit to supine: Mod assist, +2 for physical assistance        Transfers                   General transfer comment: did not attempt due to poor sitting balance    Ambulation/Gait               General Gait Details: non ambulatory at  baseline  Stairs            Wheelchair Mobility     Tilt Bed    Modified Rankin (Stroke Patients Only)       Balance Overall balance assessment: Needs assistance Sitting-balance support: Feet supported Sitting balance-Leahy Scale: Poor   Postural control: Posterior lean                                   Pertinent Vitals/Pain Pain Assessment Pain Assessment: PAINAD Breathing: normal Negative Vocalization: none Facial Expression: smiling or inexpressive Body Language: relaxed Consolability: no need to console PAINAD Score: 0 Pain Intervention(s): Monitored during session    Home Living Family/patient expects to be discharged to:: Skilled nursing facility                        Prior Function Prior Level of Function : Needs assist             Mobility Comments: wheelchair bound at SNF.       Extremity/Trunk Assessment   Upper Extremity Assessment Upper Extremity Assessment: Defer to OT evaluation    Lower Extremity Assessment Lower Extremity Assessment:  Generalized weakness    Cervical / Trunk Assessment Cervical / Trunk Assessment: Normal  Communication   Communication Communication: Impaired Factors Affecting Communication: Difficulty expressing self    Cognition Arousal: Alert Behavior During Therapy: WFL for tasks assessed/performed   PT - Cognitive impairments: History of cognitive impairments, No family/caregiver present to determine baseline                         Following commands: Impaired Following commands impaired: Follows one step commands with increased time     Cueing Cueing Techniques: Verbal cues, Gestural cues      General Comments      Exercises     Assessment/Plan    PT Assessment Patient needs continued PT services  PT Problem List Decreased strength;Decreased activity tolerance;Decreased balance;Decreased mobility;Decreased cognition       PT Treatment Interventions Functional mobility training;Therapeutic activities;Therapeutic exercise;Balance training;Patient/family education    PT Goals (Current goals can be found in the Care Plan section)  Acute Rehab PT Goals Patient Stated Goal: to do better for himself PT Goal Formulation: With patient Time For Goal Achievement: 06/17/23 Potential to Achieve Goals: Good    Frequency Min 1X/week     Co-evaluation PT/OT/SLP Co-Evaluation/Treatment: Yes Reason for Co-Treatment: For patient/therapist safety;Necessary to address cognition/behavior during functional activity;To address functional/ADL transfers PT goals addressed during session: Mobility/safety with mobility         AM-PAC PT "6 Clicks" Mobility  Outcome Measure Help needed turning from your back to your side while in a flat bed without using bedrails?: A Lot Help needed moving from lying on your back to sitting on the side of a flat bed without using bedrails?: A Lot Help needed moving to and from a bed to a chair (including a wheelchair)?: Total Help needed standing up from a chair using your arms (e.g., wheelchair or bedside chair)?: Total Help needed to walk in hospital room?: Total Help needed climbing 3-5 steps with a railing? : Total 6 Click Score: 8    End of Session   Activity Tolerance: Patient tolerated treatment well Patient left: in bed;with call bell/phone within reach;with bed alarm set Nurse Communication: Mobility status PT Visit Diagnosis: Other abnormalities of gait and mobility (R26.89);Muscle weakness (generalized) (M62.81)    Time: 4540-9811 PT Time Calculation (min) (ACUTE ONLY): 18 min   Charges:   PT Evaluation $PT Eval Moderate Complexity:  1 Mod   PT General Charges $$ ACUTE PT VISIT: 1 Visit         Jesse Hirst, PT, GCS 06/03/23,10:47 AM

## 2023-06-03 NOTE — Progress Notes (Signed)
 PROGRESS NOTE                                                                                                                                                                                                             Patient Demographics:    Kurt Walton, is a 73 y.o. male, DOB - 1950/10/15, ZOX:[id number]  Outpatient Primary MD for the patient is Jethro Bastos, MD    LOS - 2  Admit date - 06/01/2023    Chief Complaint  Patient presents with   Altered Mental Status   weakness       Brief Narrative (HPI from H&P)   73 y.o. M from [place] with PMH significant for HTN, cerebral aneurysm and CVA with residual hemiparesis, dementia and recent hospitalization after a fall and found to have bladder distension and severe phimosis and AKI and was discharged back with an indwelling foley.  He was brought back to the ED 3/9 with worsening mental status a fever.  Purulent discharge around foley noted.  Work-up was significant for hgb, leuks and bacteria on UA, WBC 12.6, lactic acid 1.1.  His foley was exchanged and he was given 3L IVF with persistent hypotension, so critical care was consulted as he required Levophed to maintain adequate MAP's.  He was admitted for with sepsis caused by UTI in the presence of indwelling Foley catheter, he was stabilized in ICU and transferred to my care on 06/03/2023.   Subjective:    Kurt Walton today has, No headache, No chest pain, No abdominal pain - No Nausea, No new weakness tingling or numbness, no shortness of breath, unreliable historian due to underlying dementia.   Assessment  & Plan :   Septic shock secondary to ESBL UTI with bacteremia likely due to underlying urinary retention and indwelling Foley catheter present on admission. -In the setting of indwelling catheter,Foley exchanged on admission.  Outpatient ESBL E. coli urine cultures noted from urology office done 1 week ago,  patient currently on ertapenem and clinically responding well.  He has E. coli bacteremia, urine culture has Staph aureus along with gram-negative rods as well likely again E. coli.  Will get ID opinion as well, sepsis pathophysiology seems to have resolved, postdischarge follow-up with his urologist.  Continue Foley and Flomax.   AKI, Hypokalemia, Significant phimosis - Foley & Flomax  as above, gentle IV fluids, trend renal function.   Mildly elevated transaminases - Intermittently trend LFTs, likely due to sepsis and hypoperfusion.   HX of stroke with left-sided hemiparesis along with underlying baseline Dementia - pleasantly confused, be his baseline, has left-sided hemiparesis, will resume aspirin and statin, PT-OT and SLP follow-up.    HLD  :  Continue home pravastatin   Moderate protein calorie malnutrition - supportive care          Condition - Extremely Guarded  Family Communication  : Called and message left for daughter [phone]  on 06/03/2023 at 9 AM  Code Status :  Full code  Consults  : ID, PCCM  PUD Prophylaxis :     Procedures  :      CT - 1. Fluid-filled distal colon and rectal vault in keeping with a diarrheal state. 2. Interval placement of Foley catheter with decompression of the bladder. Previously noted bilateral hydroureteronephrosis has resolved. 3. Moderate prostatic hypertrophy with changes of chronic bladder outlet obstruction. Aortic Atherosclerosis (ICD10-I70.0).      Disposition Plan  :    Status is: Inpatient   DVT Prophylaxis  :    heparin injection 5,000 Units Start: 06/01/23 2200 SCDs Start: 06/01/23 2128   Lab Results  Component Value Date   PLT 140 (L) 06/03/2023    Diet :  Diet Order             Diet regular Room service appropriate? Yes with Assist; Fluid consistency: Thin  Diet effective now                    Inpatient Medications  Scheduled Meds:  aspirin EC  81 mg Oral Daily   Chlorhexidine Gluconate Cloth  6  each Topical Daily   heparin  5,000 Units Subcutaneous Q8H   insulin aspart  0-15 Units Subcutaneous Q4H   mupirocin ointment  1 Application Nasal BID   pravastatin  80 mg Oral QHS   tamsulosin  0.4 mg Oral Daily   Continuous Infusions:  lactated ringers 40 mL/hr at 06/02/23 1429   meropenem (MERREM) IV 1 g (06/02/23 2223)   PRN Meds:.acetaminophen, docusate sodium, mouth rinse, polyethylene glycol  Antibiotics  :    Anti-infectives (From admission, onward)    Start     Dose/Rate Route Frequency Ordered Stop   06/02/23 1600  ceFEPIme (MAXIPIME) 2 g in sodium chloride 0.9 % 100 mL IVPB  Status:  Discontinued        2 g 200 mL/hr over 30 Minutes Intravenous Every 24 hours 06/01/23 2138 06/02/23 0520   06/02/23 0530  meropenem (MERREM) 1 g in sodium chloride 0.9 % 100 mL IVPB        1 g 200 mL/hr over 30 Minutes Intravenous Every 12 hours 06/02/23 0520     06/01/23 2137  vancomycin variable dose per unstable renal function (pharmacist dosing)  Status:  Discontinued         Does not apply See admin instructions 06/01/23 2138 06/02/23 0524   06/01/23 1615  vancomycin (VANCOREADY) IVPB 1750 mg/350 mL        1,750 mg 175 mL/hr over 120 Minutes Intravenous  Once 06/01/23 1600 06/01/23 2205   06/01/23 1600  ceFEPIme (MAXIPIME) 2 g in sodium chloride 0.9 % 100 mL IVPB        2 g 200 mL/hr over 30 Minutes Intravenous  Once 06/01/23 1546 06/01/23 1704   06/01/23 1600  metroNIDAZOLE (FLAGYL) IVPB 500  mg        500 mg 100 mL/hr over 60 Minutes Intravenous  Once 06/01/23 1546 06/01/23 1954   06/01/23 1600  vancomycin (VANCOCIN) IVPB 1000 mg/200 mL premix  Status:  Discontinued        1,000 mg 200 mL/hr over 60 Minutes Intravenous  Once 06/01/23 1546 06/01/23 1600         Objective:   Vitals:   06/03/23 0034 06/03/23 0200 06/03/23 0400 06/03/23 0500  BP:   110/71   Pulse: 87 84 92   Resp: 15 (!) 22 (!) 25   Temp:   99 F (37.2 C)   TempSrc:   Oral   SpO2: 98% 95% 96%   Weight:     82.2 kg  Height:        Wt Readings from Last 3 Encounters:  06/03/23 82.2 kg  05/05/23 83.8 kg  01/04/21 86 kg     Intake/Output Summary (Last 24 hours) at 06/03/2023 0924 Last data filed at 06/03/2023 0600 Gross per 24 hour  Intake 1752.59 ml  Output 2350 ml  Net -597.41 ml     Physical Exam  Awake but confused, No new F.N deficits, L sided weakness, Foley,  [place].AT,PERRAL Supple Neck, No JVD,   Symmetrical Chest wall movement, Good air movement bilaterally, CTAB RRR,No Gallops,Rubs or new Murmurs,  +ve B.Sounds, Abd Soft, No tenderness,   No Cyanosis, Clubbing or edema     Data Review:    Recent Labs  Lab 06/01/23 1600 06/01/23 2242 06/02/23 0343 06/03/23 0758  WBC 12.6* 12.0* 9.9 8.0  HGB 10.6* 9.5* 8.8* 8.6*  HCT 33.7* 30.4* 28.3* 26.5*  PLT 166 138* 133* 140*  MCV 84.0 84.2 83.5 80.8  MCH 26.4 26.3 26.0 26.2  MCHC 31.5 31.3 31.1 32.5  RDW 14.7 14.8 14.7 14.7  LYMPHSABS 0.6*  --   --  PENDING  MONOABS 0.6  --   --  PENDING  EOSABS 0.0  --   --  PENDING  BASOSABS 0.0  --   --  PENDING    Recent Labs  Lab 06/01/23 1600 06/01/23 1624 06/01/23 2158 06/01/23 2242 06/02/23 0343  NA 141  --   --   --  142  K 3.2*  --   --   --  3.6  CL 106  --   --   --  109  CO2 22  --   --   --  21*  ANIONGAP 13  --   --   --  12  GLUCOSE 112*  --   --   --  103*  BUN 57*  --   --   --  51*  CREATININE 3.08*  --  1.88*  --  2.64*  AST 82*  --   --   --   --   ALT 50*  --   --   --   --   ALKPHOS 99  --   --   --   --   BILITOT 0.9  --   --   --   --   ALBUMIN 1.9*  --   --   --   --   LATICACIDVEN  --  1.1  --   --   --   INR 1.4*  --   --   --   --   HGBA1C  --   --   --  5.9*  --   MG  --   --   --   --  2.3  PHOS  --   --   --   --  4.6  CALCIUM 7.4*  --   --   --  7.1*      Recent Labs  Lab 06/01/23 1600 06/01/23 1624 06/01/23 2242 06/02/23 0343  LATICACIDVEN  --  1.1  --   --   INR 1.4*  --   --   --   HGBA1C  --   --  5.9*  --   MG  --   --    --  2.3  CALCIUM 7.4*  --   --  7.1*    --------------------------------------------------------------------------------------------------------------- Lab Results  Component Value Date   CHOL 195 02/06/2012   HDL 82 02/06/2012   LDLCALC 97 02/06/2012   TRIG 82 02/06/2012   CHOLHDL 2.4 02/06/2012    Lab Results  Component Value Date   HGBA1C 5.9 (H) 06/01/2023   No results for input(s): "TSH", "T4TOTAL", "FREET4", "T3FREE", "THYROIDAB" in the last 72 hours. No results for input(s): "VITAMINB12", "FOLATE", "FERRITIN", "TIBC", "IRON", "RETICCTPCT" in the last 72 hours. ------------------------------------------------------------------------------------------------------------------ Cardiac Enzymes No results for input(s): "CKMB", "TROPONINI", "MYOGLOBIN" in the last 168 hours.  Invalid input(s): "CK"  Micro Results Recent Results (from the past 240 hours)  Resp panel by RT-PCR (RSV, Flu A&B, Covid) Anterior Nasal Swab     Status: None   Collection Time: 06/01/23  3:46 PM   Specimen: Anterior Nasal Swab  Result Value Ref Range Status   SARS Coronavirus 2 by RT PCR NEGATIVE NEGATIVE Final   Influenza A by PCR NEGATIVE NEGATIVE Final   Influenza B by PCR NEGATIVE NEGATIVE Final    Comment: (NOTE) The Xpert Xpress SARS-CoV-2/FLU/RSV plus assay is intended as an aid in the diagnosis of influenza from Nasopharyngeal swab specimens and should not be used as a sole basis for treatment. Nasal washings and aspirates are unacceptable for Xpert Xpress SARS-CoV-2/FLU/RSV testing.  Fact Sheet for Patients: BloggerCourse.com  Fact Sheet for Healthcare Providers: SeriousBroker.it  This test is not yet approved or cleared by the [place] FDA and has been authorized for detection and/or diagnosis of SARS-CoV-2 by FDA under an Emergency Use Authorization (EUA). This EUA will remain in effect (meaning this test can be used) for the  duration of the COVID-19 declaration under Section 564(b)(1) of the Act, 21 U.S.C. section 360bbb-3(b)(1), unless the authorization is terminated or revoked.     Resp Syncytial Virus by PCR NEGATIVE NEGATIVE Final    Comment: (NOTE) Fact Sheet for Patients: BloggerCourse.com  Fact Sheet for Healthcare Providers: SeriousBroker.it  This test is not yet approved or cleared by the [place] FDA and has been authorized for detection and/or diagnosis of SARS-CoV-2 by FDA under an Emergency Use Authorization (EUA). This EUA will remain in effect (meaning this test can be used) for the duration of the COVID-19 declaration under Section 564(b)(1) of the Act, 21 U.S.C. section 360bbb-3(b)(1), unless the authorization is terminated or revoked.  Performed at [facility], [address], [place], [place] [address]   Blood Culture (routine x 2)     Status: None (Preliminary result)   Collection Time: 06/01/23  3:46 PM   Specimen: BLOOD  Result Value Ref Range Status   Specimen Description BLOOD RIGHT ANTECUBITAL  Final   Special Requests   Final    BOTTLES DRAWN AEROBIC AND ANAEROBIC Blood Culture results may not be optimal due to an inadequate volume of blood received in culture bottles   Culture  Setup Time   Final    GRAM NEGATIVE RODS ANAEROBIC BOTTLE ONLY CRITICAL RESULT CALLED TO, READ BACK BY AND VERIFIED WITH: PHARMD K LEDFORD 06/02/2023  @ 0514 BY AB Performed at [facility], [address] [place], [place] [address]    Culture GRAM NEGATIVE RODS  Final   Report Status PENDING  Incomplete  Blood Culture ID Panel (Reflexed)     Status: Abnormal   Collection Time: 06/01/23  3:46 PM  Result Value Ref Range Status   Enterococcus faecalis NOT DETECTED NOT DETECTED Final   Enterococcus Faecium NOT DETECTED NOT DETECTED Final   Listeria monocytogenes NOT DETECTED NOT DETECTED Final   Staphylococcus species NOT  DETECTED NOT DETECTED Final   Staphylococcus aureus (BCID) NOT DETECTED NOT DETECTED Final   Staphylococcus epidermidis NOT DETECTED NOT DETECTED Final   Staphylococcus lugdunensis NOT DETECTED NOT DETECTED Final   Streptococcus species NOT DETECTED NOT DETECTED Final   Streptococcus agalactiae NOT DETECTED NOT DETECTED Final   Streptococcus pneumoniae NOT DETECTED NOT DETECTED Final   Streptococcus pyogenes NOT DETECTED NOT DETECTED Final   A.calcoaceticus-baumannii NOT DETECTED NOT DETECTED Final   Bacteroides fragilis NOT DETECTED NOT DETECTED Final   Enterobacterales DETECTED (A) NOT DETECTED Final    Comment: Enterobacterales represent a large order of gram negative bacteria, not a single organism. CRITICAL RESULT CALLED TO, READ BACK BY AND VERIFIED WITH: PHARMD K LEDFORD 06/02/2023  @ 0514 BY AB    Enterobacter cloacae complex NOT DETECTED NOT DETECTED Final   Escherichia coli DETECTED (A) NOT DETECTED Final    Comment: CRITICAL RESULT CALLED TO, READ BACK BY AND VERIFIED WITH: PHARMD K LEDFORD 06/02/2023  @ 0514 BY AB    Klebsiella aerogenes NOT DETECTED NOT DETECTED Final   Klebsiella oxytoca NOT DETECTED NOT DETECTED Final   Klebsiella pneumoniae NOT DETECTED NOT DETECTED Final   Proteus species NOT DETECTED NOT DETECTED Final   Salmonella species NOT DETECTED NOT DETECTED Final   Serratia marcescens NOT DETECTED NOT DETECTED Final   Haemophilus influenzae NOT DETECTED NOT DETECTED Final   Neisseria meningitidis NOT DETECTED NOT DETECTED Final   Pseudomonas aeruginosa NOT DETECTED NOT DETECTED Final   Stenotrophomonas maltophilia NOT DETECTED NOT DETECTED Final   Candida albicans NOT DETECTED NOT DETECTED Final   Candida auris NOT DETECTED NOT DETECTED Final   Candida glabrata NOT DETECTED NOT DETECTED Final   Candida krusei NOT DETECTED NOT DETECTED Final   Candida parapsilosis NOT DETECTED NOT DETECTED Final   Candida tropicalis NOT DETECTED NOT DETECTED Final    Cryptococcus neoformans/gattii NOT DETECTED NOT DETECTED Final   CTX-M ESBL DETECTED (A) NOT DETECTED Final    Comment: CRITICAL RESULT CALLED TO, READ BACK BY AND VERIFIED WITH: PHARMD K LEDFORD 06/02/2023  @ 0514 BY AB (NOTE) Extended spectrum beta-lactamase detected. Recommend a carbapenem as initial therapy.      Carbapenem resistance IMP NOT DETECTED NOT DETECTED Final   Carbapenem resistance KPC NOT DETECTED NOT DETECTED Final   Carbapenem resistance NDM NOT DETECTED NOT DETECTED Final   Carbapenem resist OXA 48 LIKE NOT DETECTED NOT DETECTED Final   Carbapenem resistance VIM NOT DETECTED NOT DETECTED Final    Comment: Performed at [facility], [address], [place], [place] [address]  Blood Culture (routine x 2)     Status: None (Preliminary result)   Collection Time: 06/01/23  3:51 PM   Specimen: BLOOD LEFT HAND  Result Value Ref Range Status   Specimen  Description BLOOD LEFT HAND  Final   Special Requests   Final    BOTTLES DRAWN AEROBIC AND ANAEROBIC Blood Culture results may not be optimal due to an inadequate volume of blood received in culture bottles   Culture  Setup Time   Final    GRAM NEGATIVE RODS AEROBIC BOTTLE ONLY CRITICAL VALUE NOTED.  VALUE IS CONSISTENT WITH PREVIOUSLY REPORTED AND CALLED VALUE. Performed at [facility], [address], [place], [place] [address]    Culture GRAM NEGATIVE RODS  Final   Report Status PENDING  Incomplete  Urine Culture     Status: Abnormal   Collection Time: 06/01/23  5:07 PM   Specimen: Urine, Random  Result Value Ref Range Status   Specimen Description URINE, RANDOM  Final   Special Requests NONE Reflexed from [id number]  Final   Culture (A)  Final    MULTIPLE SPECIES PRESENT, SUGGEST RECOLLECTION PREVIOUSLY REPORTED AS: >=100,000 COLONIES/mL STAPHYLOCOCCUS AUREUS 70,000 COLONIES/mL GRAM NEGATIVE RODS IDENTIFICATION AND SUSCEPTIBILITIES TO FOLLOW CORRECTED RESULTS CALLED TO: RN Cala Bradford JOYNER ON 06/02/23 @  1833 BY DRT Performed at [facility], [address], [place], [place] [address]    Report Status 06/02/2023 FINAL  Final  MRSA Next Gen by PCR, Nasal     Status: Abnormal   Collection Time: 06/01/23  9:58 PM   Specimen: Nasal Mucosa; Nasal Swab  Result Value Ref Range Status   MRSA by PCR Next Gen DETECTED (A) NOT DETECTED Final    Comment: RESULT CALLED TO, READ BACK BY AND VERIFIED WITH: G TATE RN 06/02/2023 @ 0032 BY AB (NOTE) The GeneXpert MRSA Assay (FDA approved for NASAL specimens only), is one component of a comprehensive MRSA colonization surveillance program. It is not intended to diagnose MRSA infection nor to guide or monitor treatment for MRSA infections. Test performance is not FDA approved in patients less than 70 years old. Performed at [facility], [address], [place], [place] [address]     Radiology Report CT ABDOMEN PELVIS WO CONTRAST Result Date: 06/01/2023 CLINICAL DATA:  Sepsis EXAM: CT ABDOMEN AND PELVIS WITHOUT CONTRAST TECHNIQUE: Multidetector CT imaging of the abdomen and pelvis was performed following the standard protocol without IV contrast. RADIATION DOSE REDUCTION: This exam was performed according to the departmental dose-optimization program which includes automated exposure control, adjustment of the mA and/or kV according to patient size and/or use of iterative reconstruction technique. COMPARISON:  05/04/2023 FINDINGS: Lower chest: No acute abnormality. Hepatobiliary: Scattered hepatic hypodensities are again seen throughout the liver likely representing small attic cysts. No solid intrahepatic mass is clearly identified on this noncontrast examination. No intra or extrahepatic biliary ductal dilation. Gallbladder is decompressed and is unremarkable. Pancreas: Unremarkable Spleen: Unremarkable Adrenals/Urinary Tract: Foley catheter balloon is now seen within the bladder lumen and the bladder is decompressed. Previously noted bilateral  hydroureteronephrosis has resolved. The bladder wall is diffusely thickened in keeping with changes chronic bladder outlet obstruction. The kidneys are unremarkable in this noncontrast examination. The adrenal glands are unremarkable. Stomach/Bowel: The distal colon and rectal vault to fluid-filled keeping with a diarrheal state. The stomach, small bowel, and large are of all is unremarkable. Ventriculoperitoneal shunt catheter tubing is seen looped with pelvis with its tip within the right lower quadrant. No free intraperitoneal gas or fluid. Appendix normal. Vascular/Lymphatic: Aortic atherosclerosis. No enlarged abdominal or pelvic lymph nodes. Reproductive: Moderate prostatic hypertrophy again noted with estimated prostatic volume of 70-80 cc. Other: No abdominal wall  hernia or abnormality. No abdominopelvic ascites. Musculoskeletal: Right femoral nail partially visualized. Moderate lumbar dextroscoliosis is present superimposed degenerative changes throughout the lumbar spine. Superimposed anterior wedge compression deformity of L1 is present with 50-60% loss of height anteriorly and no retropulsion, chronic in nature. No acute bone abnormality. No lytic or blastic bone lesion. IMPRESSION: 1. Fluid-filled distal colon and rectal vault in keeping with a diarrheal state. 2. Interval placement of Foley catheter with decompression of the bladder. Previously noted bilateral hydroureteronephrosis has resolved. 3. Moderate prostatic hypertrophy with changes of chronic bladder outlet obstruction. Aortic Atherosclerosis (ICD10-I70.0). Electronically Signed   By: Helyn Numbers M.D.   On: 06/01/2023 19:32   DG Chest Port 1 View Result Date: 06/01/2023 CLINICAL DATA:  Sepsis. EXAM: PORTABLE CHEST 1 VIEW COMPARISON:  X-ray 05/04/2023 FINDINGS: Underinflation. Stable cardiopericardial silhouette with calcified aorta. No pneumothorax or effusion. There is some asymmetric opacity at the left lung base. Infiltrate is  possible. Recommend follow-up. Overlapping cardiac leads. There is vertical oriented tube along the right hemithorax. Please correlate for known history of a VP shunt. IMPRESSION: Underinflation with mild left lung base opacity. Infiltrates possible. Recommend follow-up. Known VP shunt. Electronically Signed   By: Karen Kays M.D.   On: 06/01/2023 16:24     Signature  -   Susa Raring M.D on 06/03/2023 at 9:24 AM   -  To page go to [email or web address]

## 2023-06-04 DIAGNOSIS — A419 Sepsis, unspecified organism: Secondary | ICD-10-CM | POA: Diagnosis not present

## 2023-06-04 DIAGNOSIS — R6521 Severe sepsis with septic shock: Secondary | ICD-10-CM | POA: Diagnosis not present

## 2023-06-04 LAB — CBC WITH DIFFERENTIAL/PLATELET
Abs Immature Granulocytes: 0 10*3/uL (ref 0.00–0.07)
Basophils Absolute: 0 10*3/uL (ref 0.0–0.1)
Basophils Relative: 0 %
Eosinophils Absolute: 0.2 10*3/uL (ref 0.0–0.5)
Eosinophils Relative: 2 %
HCT: 30.1 % — ABNORMAL LOW (ref 39.0–52.0)
Hemoglobin: 9.5 g/dL — ABNORMAL LOW (ref 13.0–17.0)
Lymphocytes Relative: 10 %
Lymphs Abs: 1.1 10*3/uL (ref 0.7–4.0)
MCH: 25.9 pg — ABNORMAL LOW (ref 26.0–34.0)
MCHC: 31.6 g/dL (ref 30.0–36.0)
MCV: 82 fL (ref 80.0–100.0)
Monocytes Absolute: 0.1 10*3/uL (ref 0.1–1.0)
Monocytes Relative: 1 %
Neutro Abs: 9.2 10*3/uL — ABNORMAL HIGH (ref 1.7–7.7)
Neutrophils Relative %: 87 %
Platelets: 176 10*3/uL (ref 150–400)
RBC: 3.67 MIL/uL — ABNORMAL LOW (ref 4.22–5.81)
RDW: 14.9 % (ref 11.5–15.5)
WBC: 10.6 10*3/uL — ABNORMAL HIGH (ref 4.0–10.5)
nRBC: 0 % (ref 0.0–0.2)
nRBC: 0 /100{WBCs}

## 2023-06-04 LAB — CULTURE, BLOOD (ROUTINE X 2)

## 2023-06-04 LAB — C-REACTIVE PROTEIN: CRP: 12.7 mg/dL — ABNORMAL HIGH (ref <1.0)

## 2023-06-04 LAB — GLUCOSE, CAPILLARY
Glucose-Capillary: 94 mg/dL (ref 70–99)
Glucose-Capillary: 84 mg/dL (ref 70–99)

## 2023-06-04 LAB — PROCALCITONIN: Procalcitonin: 10.77 ng/mL

## 2023-06-04 LAB — COMPREHENSIVE METABOLIC PANEL
ALT: 31 U/L (ref 0–44)
AST: 54 U/L — ABNORMAL HIGH (ref 15–41)
Albumin: 2.2 g/dL — ABNORMAL LOW (ref 3.5–5.0)
Alkaline Phosphatase: 68 U/L (ref 38–126)
Anion gap: 6 (ref 5–15)
BUN: 36 mg/dL — ABNORMAL HIGH (ref 8–23)
CO2: 23 mmol/L (ref 22–32)
Calcium: 7.9 mg/dL — ABNORMAL LOW (ref 8.9–10.3)
Chloride: 114 mmol/L — ABNORMAL HIGH (ref 98–111)
Creatinine, Ser: 1.85 mg/dL — ABNORMAL HIGH (ref 0.61–1.24)
GFR, Estimated: 38 mL/min — ABNORMAL LOW (ref >60)
Glucose, Bld: 105 mg/dL — ABNORMAL HIGH (ref 70–99)
Potassium: 4.4 mmol/L (ref 3.5–5.1)
Sodium: 143 mmol/L (ref 135–145)
Total Bilirubin: 0.8 mg/dL (ref 0.0–1.2)
Total Protein: 5.7 g/dL — ABNORMAL LOW (ref 6.5–8.1)

## 2023-06-04 LAB — BRAIN NATRIURETIC PEPTIDE: B Natriuretic Peptide: 483.5 pg/mL — ABNORMAL HIGH (ref 0.0–100.0)

## 2023-06-04 LAB — PHOSPHORUS: Phosphorus: 2.7 mg/dL (ref 2.5–4.6)

## 2023-06-04 LAB — MAGNESIUM: Magnesium: 2.2 mg/dL (ref 1.7–2.4)

## 2023-06-04 MED ORDER — ENSURE ENLIVE PO LIQD
237.0000 mL | Freq: Two times a day (BID) | ORAL | Status: DC
  Administered 2023-06-04 – 2023-06-09 (×10): 237 mL via ORAL

## 2023-06-04 NOTE — Progress Notes (Signed)
 Unable to complete CSSRs due to patient AMS/confusion

## 2023-06-04 NOTE — Progress Notes (Signed)
 PROGRESS NOTE                                                                                                                                                                                                             Patient Demographics:    Kurt Walton, is a 73 y.o. male, DOB - [birth year], IHK:[id number]  Outpatient Primary MD for the patient is Jethro Bastos, MD    LOS - 3  Admit date - 06/01/2023    Chief Complaint  Patient presents with   Altered Mental Status   weakness       Brief Narrative (HPI from H&P)   73 y.o. M from [place] with PMH significant for HTN, cerebral aneurysm and CVA with residual hemiparesis, dementia and recent hospitalization after a fall and found to have bladder distension and severe phimosis and AKI and was discharged back with an indwelling foley.  He was brought back to the ED 3/9 with worsening mental status a fever.  Purulent discharge around foley noted.  Work-up was significant for hgb, leuks and bacteria on UA, WBC 12.6, lactic acid 1.1.  His foley was exchanged and he was given 3L IVF with persistent hypotension, so critical care was consulted as he required Levophed to maintain adequate MAP's.  He was admitted for with sepsis caused by UTI in the presence of indwelling Foley catheter, he was stabilized in ICU and transferred to my care on 06/03/2023.   Subjective:   Patient in bed sleeping, easily arousable in no distress, denies any headache chest or abdominal pain, unreliable historian due to underlying dementia.   Assessment  & Plan :   Septic shock secondary to ESBL UTI with bacteremia likely due to underlying urinary retention and indwelling Foley catheter present on admission. -In the setting of indwelling catheter,Foley exchanged on admission.  Outpatient ESBL E. coli urine cultures noted from urology office done 1 week ago, patient currently on meropenem and clinically  responding well.  He has E. coli bacteremia, urine culture has Staph aureus(contaminant) along with gram-negative rods as well likely again E. coli.  Continue Flomax, Foley was changed in the ER, outpatient urology follow-up, he has had indwelling Foley for several weeks, appreciate ID input.  Total 7 days of meropenem.   AKI, Hypokalemia, Significant phimosis - Foley & Flomax as above, gentle IV  fluids, function close to baseline.  Has underlying CKD 3B with baseline creatinine around 1.6.   Mildly elevated transaminases - Intermittently trend LFTs, likely due to sepsis and hypoperfusion.   HX of stroke with left-sided hemiparesis along with underlying baseline Dementia - pleasantly confused, be his baseline, has left-sided hemiparesis, will resume aspirin and statin, PT-OT and SLP follow-up.    HLD  :  Continue home pravastatin   Moderate protein calorie malnutrition - supportive care        Condition - Extremely Guarded  Family Communication  : Called and message left for daughter [phone]  on 06/03/2023 at 9 AM, updated in detail on 06/04/2023, DNR  Code Status : DNR  Consults  : ID, PCCM  PUD Prophylaxis :     Procedures  :      CT - 1. Fluid-filled distal colon and rectal vault in keeping with a diarrheal state. 2. Interval placement of Foley catheter with decompression of the bladder. Previously noted bilateral hydroureteronephrosis has resolved. 3. Moderate prostatic hypertrophy with changes of chronic bladder outlet obstruction. Aortic Atherosclerosis (ICD10-I70.0).      Disposition Plan  :    Status is: Inpatient   DVT Prophylaxis  :    heparin injection 5,000 Units Start: 06/01/23 2200 SCDs Start: 06/01/23 2128   Lab Results  Component Value Date   PLT 176 06/04/2023    Diet :  Diet Order             Diet regular Room service appropriate? Yes with Assist; Fluid consistency: Thin  Diet effective now                    Inpatient  Medications  Scheduled Meds:  aspirin EC  81 mg Oral Daily   Chlorhexidine Gluconate Cloth  6 each Topical Daily   heparin  5,000 Units Subcutaneous Q8H   insulin aspart  0-15 Units Subcutaneous Q4H   mupirocin ointment  1 Application Nasal BID   pravastatin  80 mg Oral QHS   tamsulosin  0.4 mg Oral Daily   Continuous Infusions:  meropenem (MERREM) IV Stopped (06/04/23 0659)   PRN Meds:.acetaminophen, docusate sodium, mouth rinse, polyethylene glycol   Objective:   Vitals:   06/04/23 0000 06/04/23 0414 06/04/23 0600 06/04/23 0721  BP: 106/66 113/68  98/66  Pulse: 70 70  67  Resp: 18 20  15   Temp:  98.8 F (37.1 C)  97.7 F (36.5 C)  TempSrc:  Oral  Oral  SpO2: 99% 100%  99%  Weight:   80 kg   Height:        Wt Readings from Last 3 Encounters:  06/04/23 80 kg  05/05/23 83.8 kg  01/04/21 86 kg     Intake/Output Summary (Last 24 hours) at 06/04/2023 0930 Last data filed at 06/04/2023 0400 Gross per 24 hour  Intake 4710.28 ml  Output 2300 ml  Net 2410.28 ml     Physical Exam  Awake but confused, No new F.N deficits, L sided weakness, Foley,  .AT,PERRAL Supple Neck, No JVD,   Symmetrical Chest wall movement, Good air movement bilaterally, CTAB RRR,No Gallops,Rubs or new Murmurs,  +ve B.Sounds, Abd Soft, No tenderness,   No Cyanosis, Clubbing or edema     Data Review:    Recent Labs  Lab 06/01/23 1600 06/01/23 2242 06/02/23 0343 06/03/23 0758 06/04/23 0504  WBC 12.6* 12.0* 9.9 8.0 10.6*  HGB 10.6* 9.5* 8.8* 8.6* 9.5*  HCT 33.7* 30.4* 28.3* 26.5*  30.1*  PLT 166 138* 133* 140* 176  MCV 84.0 84.2 83.5 80.8 82.0  MCH 26.4 26.3 26.0 26.2 25.9*  MCHC 31.5 31.3 31.1 32.5 31.6  RDW 14.7 14.8 14.7 14.7 14.9  LYMPHSABS 0.6*  --   --  1.3 1.1  MONOABS 0.6  --   --  0.6 0.1  EOSABS 0.0  --   --  0.0 0.2  BASOSABS 0.0  --   --  0.0 0.0    Recent Labs  Lab 06/01/23 1600 06/01/23 1624 06/01/23 2158 06/01/23 2242 06/02/23 0343 06/03/23 0758  06/04/23 0504  NA 141  --   --   --  142 145 143  K 3.2*  --   --   --  3.6 3.4* 4.4  CL 106  --   --   --  109 111 114*  CO2 22  --   --   --  21* 28 23  ANIONGAP 13  --   --   --  12 6 6   GLUCOSE 112*  --   --   --  103* 100* 105*  BUN 57*  --   --   --  51* 42* 36*  CREATININE 3.08*  --  1.88*  --  2.64* 2.08* 1.85*  AST 82*  --   --   --   --   --  54*  ALT 50*  --   --   --   --   --  31  ALKPHOS 99  --   --   --   --   --  68  BILITOT 0.9  --   --   --   --   --  0.8  ALBUMIN 1.9*  --   --   --   --   --  2.2*  CRP  --   --   --   --   --  14.2* 12.7*  PROCALCITON  --   --   --   --   --  19.48 10.77  LATICACIDVEN  --  1.1  --   --   --   --   --   INR 1.4*  --   --   --   --   --   --   HGBA1C  --   --   --  5.9*  --   --   --   BNP  --   --   --   --   --  700.0* 483.5*  MG  --   --   --   --  2.3 2.4 2.2  PHOS  --   --   --   --  4.6  --  2.7  CALCIUM 7.4*  --   --   --  7.1* 7.9* 7.9*      Recent Labs  Lab 06/01/23 1600 06/01/23 1624 06/01/23 2242 06/02/23 0343 06/03/23 0758 06/04/23 0504  CRP  --   --   --   --  14.2* 12.7*  PROCALCITON  --   --   --   --  19.48 10.77  LATICACIDVEN  --  1.1  --   --   --   --   INR 1.4*  --   --   --   --   --   HGBA1C  --   --  5.9*  --   --   --   BNP  --   --   --   --  700.0* 483.5*  MG  --   --   --  2.3 2.4 2.2  CALCIUM 7.4*  --   --  7.1* 7.9* 7.9*    --------------------------------------------------------------------------------------------------------------- Lab Results  Component Value Date   CHOL 195 02/06/2012   HDL 82 02/06/2012   LDLCALC 97 02/06/2012   TRIG 82 02/06/2012   CHOLHDL 2.4 02/06/2012    Lab Results  Component Value Date   HGBA1C 5.9 (H) 06/01/2023   No results for input(s): "TSH", "T4TOTAL", "FREET4", "T3FREE", "THYROIDAB" in the last 72 hours. No results for input(s): "VITAMINB12", "FOLATE", "FERRITIN", "TIBC", "IRON", "RETICCTPCT" in the last 72  hours. ------------------------------------------------------------------------------------------------------------------ Cardiac Enzymes No results for input(s): "CKMB", "TROPONINI", "MYOGLOBIN" in the last 168 hours.  Invalid input(s): "CK"   Radiology Report No results found.    Signature  -   Susa Raring M.D on 06/04/2023 at 9:30 AM   -  To page go to [email or web address]

## 2023-06-04 NOTE — Plan of Care (Signed)
        Plan of care is reviewed .  Problem: Fluid Volume: Goal: Ability to maintain a balanced intake and output will improve Outcome: Progressing- adequate oral intake, IV hydration and good urine output.    Problem: Metabolic: monitor CBG q 4 hrs.  Goal: Ability to maintain appropriate glucose levels will improve Outcome: Progressing-  CBG is in normal range tonight.   Problem: Nutritional: Pt has good appetite today. He requires full feeder assist. He ate more than 50% of his tray per RN reported yesterday. He had adequate oral fluid intake.  Outcome: Progressing.  Goal: Progress toward achieving an optimal weight will improve Outcome: Progressing   Problem: Clinical Measurements: Goal: Ability to maintain clinical measurements within normal limits will improve Outcome: Progressing Goal: Will remain free from infection Outcome: Progressing- afebrile, under contact precaution from ESBL bacteriemia. He has been getting Meropenem IV q 12 hrs. Foley care and peri care provided q shift, CHG every day per protocol to prevent CAUTI. Goal: Respiratory complications will improve Outcome: Progressing- on room air, no respiratory distress. Goal: Cardiovascular complication will be avoided Outcome: Progressing- stable hemodynamically, NSR on the monitor.    Problem: Skin Integrity: Goal: Risk for impaired skin integrity will decrease Outcome: Progressing- normal skin integrity, no pressure ulcer. Keep skin dry and clean. Promote position changed q 2 hrs.    Filiberto Pinks, RN

## 2023-06-04 NOTE — TOC Progression Note (Signed)
 Transition of Care [facility]) - Progression Note    Patient Details  Name: Kurt Walton MRN: [id number] Date of Birth: 12/04/1950  [facility] of [facility]) CM/SW Contact  Mearl Latin, LCSW Phone Number: 06/04/2023, 2:00 PM  Clinical Narrative:    CSW provided update to [employer].   Expected Discharge Plan: Skilled Nursing Facility Barriers to Discharge: Continued Medical Work up  Expected Discharge Plan and Services In-house Referral: Clinical Social Work Discharge Planning Services: CM Consult Post Acute Care Choice: Skilled Nursing Facility Living arrangements for the past 2 months: Skilled Nursing Facility                                       Social Determinants of Health (SDOH) Interventions SDOH Screenings   Food Insecurity: Patient Unable To Answer (06/03/2023)  Housing: Unknown (05/05/2023)  Transportation Needs: No Transportation Needs (05/05/2023)  Utilities: Not At Risk (05/05/2023)  Social Connections: Patient Unable To Answer (05/05/2023)  Tobacco Use: Medium Risk (06/01/2023)    Readmission Risk Interventions     No data to display

## 2023-06-05 DIAGNOSIS — R6521 Severe sepsis with septic shock: Secondary | ICD-10-CM | POA: Diagnosis not present

## 2023-06-05 DIAGNOSIS — A419 Sepsis, unspecified organism: Secondary | ICD-10-CM | POA: Diagnosis not present

## 2023-06-05 LAB — COMPREHENSIVE METABOLIC PANEL
ALT: 41 U/L (ref 0–44)
AST: 59 U/L — ABNORMAL HIGH (ref 15–41)
Albumin: 2.2 g/dL — ABNORMAL LOW (ref 3.5–5.0)
Alkaline Phosphatase: 69 U/L (ref 38–126)
Anion gap: 5 (ref 5–15)
BUN: 30 mg/dL — ABNORMAL HIGH (ref 8–23)
CO2: 28 mmol/L (ref 22–32)
Calcium: 8 mg/dL — ABNORMAL LOW (ref 8.9–10.3)
Chloride: 108 mmol/L (ref 98–111)
Creatinine, Ser: 1.45 mg/dL — ABNORMAL HIGH (ref 0.61–1.24)
GFR, Estimated: 51 mL/min — ABNORMAL LOW (ref >60)
Glucose, Bld: 83 mg/dL (ref 70–99)
Potassium: 3.5 mmol/L (ref 3.5–5.1)
Sodium: 141 mmol/L (ref 135–145)
Total Bilirubin: 0.5 mg/dL (ref 0.0–1.2)
Total Protein: 5.8 g/dL — ABNORMAL LOW (ref 6.5–8.1)

## 2023-06-05 LAB — PHOSPHORUS: Phosphorus: 2.9 mg/dL (ref 2.5–4.6)

## 2023-06-05 LAB — CBC WITH DIFFERENTIAL/PLATELET
Abs Immature Granulocytes: 0.1 10*3/uL — ABNORMAL HIGH (ref 0.00–0.07)
Basophils Absolute: 0.1 10*3/uL (ref 0.0–0.1)
Basophils Relative: 1 %
Eosinophils Absolute: 0 10*3/uL (ref 0.0–0.5)
Eosinophils Relative: 0 %
HCT: 30.8 % — ABNORMAL LOW (ref 39.0–52.0)
Hemoglobin: 9.8 g/dL — ABNORMAL LOW (ref 13.0–17.0)
Lymphocytes Relative: 16 %
Lymphs Abs: 1.6 10*3/uL (ref 0.7–4.0)
MCH: 25.9 pg — ABNORMAL LOW (ref 26.0–34.0)
MCHC: 31.8 g/dL (ref 30.0–36.0)
MCV: 81.3 fL (ref 80.0–100.0)
Metamyelocytes Relative: 1 %
Monocytes Absolute: 0.4 10*3/uL (ref 0.1–1.0)
Monocytes Relative: 4 %
Neutro Abs: 7.9 10*3/uL — ABNORMAL HIGH (ref 1.7–7.7)
Neutrophils Relative %: 78 %
Platelets: 159 10*3/uL (ref 150–400)
RBC: 3.79 MIL/uL — ABNORMAL LOW (ref 4.22–5.81)
RDW: 15 % (ref 11.5–15.5)
WBC: 10.1 10*3/uL (ref 4.0–10.5)
nRBC: 0 % (ref 0.0–0.2)
nRBC: 0 /100{WBCs}

## 2023-06-05 LAB — BRAIN NATRIURETIC PEPTIDE: B Natriuretic Peptide: 308.1 pg/mL — ABNORMAL HIGH (ref 0.0–100.0)

## 2023-06-05 LAB — C-REACTIVE PROTEIN: CRP: 9.9 mg/dL — ABNORMAL HIGH (ref <1.0)

## 2023-06-05 LAB — PROCALCITONIN: Procalcitonin: 5.49 ng/mL

## 2023-06-05 LAB — MAGNESIUM: Magnesium: 2 mg/dL (ref 1.7–2.4)

## 2023-06-05 MED ORDER — MELATONIN 3 MG PO TABS
3.0000 mg | ORAL_TABLET | Freq: Every evening | ORAL | Status: DC | PRN
  Administered 2023-06-07: 3 mg via ORAL
  Filled 2023-06-05: qty 1

## 2023-06-05 MED ORDER — METOCLOPRAMIDE HCL 10 MG PO TABS
5.0000 mg | ORAL_TABLET | Freq: Once | ORAL | Status: AC | PRN
  Administered 2023-06-05: 5 mg via ORAL
  Filled 2023-06-05: qty 1

## 2023-06-05 NOTE — Progress Notes (Signed)
 Pharmacy Antibiotic Note  Kurt Walton is a 73 y.o. male admitted on 06/01/2023 with sepsis.  Pharmacy has originally consulted for cefepime and vancomycin dosing, however changed to Meropenem on 3/10 when BCx growing ESBL Ecoli.  Notable AKI on admission with Scr: 3.08 has improved to 1.5 (baseline around 1).   Plan: Continue Meropenem 1gm IV q12h through 3/16 Rx will continue to follow for changes in renal function.   Height: 5\' 9"  (175.3 cm) Weight: 81.2 kg (179 lb 0.2 oz) IBW/kg (Calculated) : 70.7  Temp (24hrs), Avg:98.2 F (36.8 C), Min:97.8 F (36.6 C), Max:98.6 F (37 C)  Recent Labs  Lab 06/01/23 1624 06/01/23 2158 06/01/23 2242 06/02/23 0343 06/03/23 0758 06/04/23 0504 06/05/23 0453  WBC  --   --  12.0* 9.9 8.0 10.6* 10.1  CREATININE  --  1.88*  --  2.64* 2.08* 1.85* 1.45*  LATICACIDVEN 1.1  --   --   --   --   --   --     Estimated Creatinine Clearance: 45.4 mL/min (A) (by C-G formula based on SCr of 1.45 mg/dL (H)).    Allergies  Allergen Reactions   Tuberculin Purified Protein Derivative Other (See Comments)    "Allergic," per MAR   Tuberculin Tests Other (See Comments)    "Allergic," per MAR    Antimicrobials this admission: Cefepime 3/9 >> 3/10 Vancomycin 3/9 >> 3/10 Meropenem 3/10 >> (3/16)  Microbiology results: 3/9 BCx: ESBL E Coli 3/9 UCx:  mult spec 3/9 MRSA PCR: positive  Thank you for allowing pharmacy to be a part of this patient's care.  [employer], Pharm.D., BCPS Clinical Pharmacist  06/05/2023 1:13 PM

## 2023-06-05 NOTE — Progress Notes (Signed)
 Physical Therapy Treatment Patient Details Name: Kurt Walton MRN: [id number] DOB: 02-26-1951 Today's Date: 06/05/2023   History of Present Illness Pt is a 73 y/o male presenting on 3/9 from [facility] with AMS and weakness.  Noted recent hospital stay 04/2023 after fall with AKI, bladder distention and foley placed. Admitted with sepsis from UTI. PMH includes HTN, cerebral aneurysm, CVA with residual L hemiparesis, dementia.    PT Comments  Focused session on strengthening and bed mobility with goal of decr burden of care for return to his long term care facility.     If plan is discharge home, recommend the following: A lot of help with walking and/or transfers;A lot of help with bathing/dressing/bathroom   Can travel by private vehicle     No  Equipment Recommendations  None recommended by PT    Recommendations for Other Services       Precautions / Restrictions Precautions Precautions: Fall Recall of Precautions/Restrictions: Impaired Restrictions Weight Bearing Restrictions Per Provider Order: No     Mobility  Bed Mobility Overal bed mobility: Needs Assistance Bed Mobility: Rolling Rolling: Min assist, Used rails (toward left)         General bed mobility comments: Placed bed in chair position.    Transfers                        Ambulation/Gait                   Stairs             Wheelchair Mobility     Tilt Bed    Modified Rankin (Stroke Patients Only)       Balance                                            Communication Communication Communication: Impaired Factors Affecting Communication: Difficulty expressing self  Cognition Arousal: Alert Behavior During Therapy: WFL for tasks assessed/performed   PT - Cognitive impairments: History of cognitive impairments                         Following commands: Impaired Following commands impaired: Follows one step commands with  increased time, Follows one step commands inconsistently    Cueing Cueing Techniques: Verbal cues, Gestural cues  Exercises Other Exercises Other Exercises: Performed repetitive rolling toward lt x 8 Other Exercises: In chair position tied sheet to footboard of bed with pt grasping sheet and pulling trunk forward from bed x 10 Other Exercises: In supine position tied sheet to footboard and pt pulled on sheet to perform partial sit up x 8    General Comments        Pertinent Vitals/Pain Pain Assessment Pain Assessment: PAINAD Breathing: normal Negative Vocalization: none Facial Expression: smiling or inexpressive Body Language: relaxed Consolability: no need to console PAINAD Score: 0    Home Living                          Prior Function            PT Goals (current goals can now be found in the care plan section) Progress towards PT goals: Progressing toward goals    Frequency    Min 1X/week      PT Plan  Co-evaluation              AM-PAC PT "6 Clicks" Mobility   Outcome Measure  Help needed turning from your back to your side while in a flat bed without using bedrails?: A Lot Help needed moving from lying on your back to sitting on the side of a flat bed without using bedrails?: A Lot Help needed moving to and from a bed to a chair (including a wheelchair)?: Total Help needed standing up from a chair using your arms (e.g., wheelchair or bedside chair)?: Total Help needed to walk in hospital room?: Total Help needed climbing 3-5 steps with a railing? : Total 6 Click Score: 8    End of Session   Activity Tolerance: Patient tolerated treatment well Patient left: in bed;with call bell/phone within reach;with bed alarm set;with nursing/sitter in room Nurse Communication: Mobility status PT Visit Diagnosis: Other abnormalities of gait and mobility (R26.89);Muscle weakness (generalized) (M62.81)     Time: 8416-6063 PT Time Calculation  (min) (ACUTE ONLY): 17 min  Charges:    $Therapeutic Activity: 8-22 mins PT General Charges $$ ACUTE PT VISIT: 1 Visit                     [facility] PT Acute Rehabilitation Services Office [phone]    Angelina Ok [facility] 06/05/2023, 12:02 PM

## 2023-06-05 NOTE — Plan of Care (Signed)

## 2023-06-05 NOTE — Plan of Care (Signed)
  Problem: Coping: Goal: Ability to adjust to condition or change in health will improve Outcome: Progressing   Problem: Activity: Goal: Risk for activity intolerance will decrease Outcome: Progressing   Problem: Elimination: Goal: Will not experience complications related to bowel motility Outcome: Progressing   Problem: Safety: Goal: Ability to remain free from injury will improve Outcome: Progressing

## 2023-06-05 NOTE — Progress Notes (Addendum)
 PROGRESS NOTE                                                                                                                                                                                                             Patient Demographics:    Kurt Walton, is a 73 y.o. male, DOB - Oct 12, 1950, WUJ:[id number]  Outpatient Primary MD for the patient is Jethro Bastos, MD    LOS - 4  Admit date - 06/01/2023    Chief Complaint  Patient presents with   Altered Mental Status   weakness       Brief Narrative (HPI from H&P)   73 y.o. M from [place] with PMH significant for HTN, cerebral aneurysm and CVA with residual hemiparesis, dementia and recent hospitalization after a fall and found to have bladder distension and severe phimosis and AKI and was discharged back with an indwelling foley.  He was brought back to the ED 3/9 with worsening mental status a fever.  Purulent discharge around foley noted.  Work-up was significant for hgb, leuks and bacteria on UA, WBC 12.6, lactic acid 1.1.  His foley was exchanged and he was given 3L IVF with persistent hypotension, so critical care was consulted as he required Levophed to maintain adequate MAP's.  He was admitted for with sepsis caused by UTI in the presence of indwelling Foley catheter, he was stabilized in ICU and transferred to my care on 06/03/2023.   Subjective:   Patient in bed, appears comfortable, denies any headache, no fever, no chest pain or pressure, no shortness of breath , no abdominal pain. No new focal weakness.   Assessment  & Plan :   Septic shock secondary to ESBL UTI with bacteremia likely due to underlying urinary retention and indwelling Foley catheter present on admission. -In the setting of indwelling catheter,Foley exchanged on admission.  Outpatient ESBL E. coli urine cultures noted from urology office done 1 week ago, patient currently on meropenem and  clinically responding well.  He has E. coli bacteremia, urine culture has Staph aureus(contaminant) along with gram-negative rods as well likely again E. coli.  Continue Flomax, Foley was changed in the ER, outpatient urology follow-up, he has had indwelling Foley for several weeks, appreciate ID input.  Total 7 days of meropenem.   AKI, Hypokalemia, Significant phimosis - Foley &  Flomax as above, gentle IV fluids, function close to baseline.  Has underlying CKD 3B with baseline creatinine around 1.6.   Mildly elevated transaminases - Intermittently trend LFTs, likely due to sepsis and hypoperfusion.   HX of stroke with left-sided hemiparesis along with underlying baseline Dementia - pleasantly confused, be his baseline, has left-sided hemiparesis, will resume aspirin and statin, PT-OT and SLP follow-up.    HLD  :  Continue home pravastatin   Moderate protein calorie malnutrition - supportive care        Condition - Extremely Guarded  Family Communication  :   Called and message left for daughter [phone]  on 06/03/2023 at 9 AM, updated in detail on 06/04/2023, DNR   DW Legal Guardian [facility] Department of Social Services. [place] [phone] -  DNR  Code Status : DNR  Consults  : ID, PCCM  PUD Prophylaxis :     Procedures  :      CT - 1. Fluid-filled distal colon and rectal vault in keeping with a diarrheal state. 2. Interval placement of Foley catheter with decompression of the bladder. Previously noted bilateral hydroureteronephrosis has resolved. 3. Moderate prostatic hypertrophy with changes of chronic bladder outlet obstruction. Aortic Atherosclerosis (ICD10-I70.0).      Disposition Plan  :    Status is: Inpatient   DVT Prophylaxis  :    heparin injection 5,000 Units Start: 06/01/23 2200 SCDs Start: 06/01/23 2128   Lab Results  Component Value Date   PLT 159 06/05/2023    Diet :  Diet Order             Diet regular Room service  appropriate? Yes with Assist; Fluid consistency: Thin  Diet effective now                    Inpatient Medications  Scheduled Meds:  aspirin EC  81 mg Oral Daily   Chlorhexidine Gluconate Cloth  6 each Topical Daily   feeding supplement  237 mL Oral BID BM   heparin  5,000 Units Subcutaneous Q8H   mupirocin ointment  1 Application Nasal BID   pravastatin  80 mg Oral QHS   tamsulosin  0.4 mg Oral Daily   Continuous Infusions:  meropenem (MERREM) IV Stopped (06/04/23 2200)   PRN Meds:.acetaminophen, docusate sodium, melatonin, mouth rinse, polyethylene glycol   Objective:   Vitals:   06/05/23 0406 06/05/23 0458 06/05/23 0737 06/05/23 0800  BP: 115/86  115/75 110/71  Pulse: 72  71 72  Resp: (!) 25  19 (!) 21  Temp: 98.4 F (36.9 C)  98.6 F (37 C)   TempSrc: Oral  Oral   SpO2: 99%  100% 99%  Weight:  81.2 kg    Height:        Wt Readings from Last 3 Encounters:  06/05/23 81.2 kg  05/05/23 83.8 kg  01/04/21 86 kg     Intake/Output Summary (Last 24 hours) at 06/05/2023 0902 Last data filed at 06/05/2023 0604 Gross per 24 hour  Intake 840 ml  Output 2250 ml  Net -1410 ml     Physical Exam  Awake but confused, No new F.N deficits, L sided weakness, Foley,  [place].AT,PERRAL Supple Neck, No JVD,   Symmetrical Chest wall movement, Good air movement bilaterally, CTAB RRR,No Gallops,Rubs or new Murmurs,  +ve B.Sounds, Abd Soft, No tenderness,   No Cyanosis, Clubbing or edema     Data Review:    Recent Labs  Lab 06/01/23 1600  06/01/23 2242 06/02/23 0343 06/03/23 0758 06/04/23 0504 06/05/23 0453  WBC 12.6* 12.0* 9.9 8.0 10.6* 10.1  HGB 10.6* 9.5* 8.8* 8.6* 9.5* 9.8*  HCT 33.7* 30.4* 28.3* 26.5* 30.1* 30.8*  PLT 166 138* 133* 140* 176 159  MCV 84.0 84.2 83.5 80.8 82.0 81.3  MCH 26.4 26.3 26.0 26.2 25.9* 25.9*  MCHC 31.5 31.3 31.1 32.5 31.6 31.8  RDW 14.7 14.8 14.7 14.7 14.9 15.0  LYMPHSABS 0.6*  --   --  1.3 1.1 1.6  MONOABS 0.6  --   --  0.6 0.1 0.4   EOSABS 0.0  --   --  0.0 0.2 0.0  BASOSABS 0.0  --   --  0.0 0.0 0.1    Recent Labs  Lab 06/01/23 1600 06/01/23 1624 06/01/23 2158 06/01/23 2242 06/02/23 0343 06/03/23 0758 06/04/23 0504 06/05/23 0453  NA 141  --   --   --  142 145 143 141  K 3.2*  --   --   --  3.6 3.4* 4.4 3.5  CL 106  --   --   --  109 111 114* 108  CO2 22  --   --   --  21* 28 23 28   ANIONGAP 13  --   --   --  12 6 6 5   GLUCOSE 112*  --   --   --  103* 100* 105* 83  BUN 57*  --   --   --  51* 42* 36* 30*  CREATININE 3.08*  --  1.88*  --  2.64* 2.08* 1.85* 1.45*  AST 82*  --   --   --   --   --  54* 59*  ALT 50*  --   --   --   --   --  31 41  ALKPHOS 99  --   --   --   --   --  68 69  BILITOT 0.9  --   --   --   --   --  0.8 0.5  ALBUMIN 1.9*  --   --   --   --   --  2.2* 2.2*  CRP  --   --   --   --   --  14.2* 12.7* 9.9*  PROCALCITON  --   --   --   --   --  19.48 10.77 5.49  LATICACIDVEN  --  1.1  --   --   --   --   --   --   INR 1.4*  --   --   --   --   --   --   --   HGBA1C  --   --   --  5.9*  --   --   --   --   BNP  --   --   --   --   --  700.0* 483.5* 308.1*  MG  --   --   --   --  2.3 2.4 2.2 2.0  PHOS  --   --   --   --  4.6  --  2.7 2.9  CALCIUM 7.4*  --   --   --  7.1* 7.9* 7.9* 8.0*      Recent Labs  Lab 06/01/23 1600 06/01/23 1624 06/01/23 2242 06/02/23 0343 06/03/23 0758 06/04/23 0504 06/05/23 0453  CRP  --   --   --   --  14.2* 12.7* 9.9*  PROCALCITON  --   --   --   --  19.48 10.77 5.49  LATICACIDVEN  --  1.1  --   --   --   --   --   INR 1.4*  --   --   --   --   --   --   HGBA1C  --   --  5.9*  --   --   --   --   BNP  --   --   --   --  700.0* 483.5* 308.1*  MG  --   --   --  2.3 2.4 2.2 2.0  CALCIUM 7.4*  --   --  7.1* 7.9* 7.9* 8.0*    --------------------------------------------------------------------------------------------------------------- Lab Results  Component Value Date   CHOL 195 02/06/2012   HDL 82 02/06/2012   LDLCALC 97 02/06/2012   TRIG 82  02/06/2012   CHOLHDL 2.4 02/06/2012    Lab Results  Component Value Date   HGBA1C 5.9 (H) 06/01/2023   No results for input(s): "TSH", "T4TOTAL", "FREET4", "T3FREE", "THYROIDAB" in the last 72 hours. No results for input(s): "VITAMINB12", "FOLATE", "FERRITIN", "TIBC", "IRON", "RETICCTPCT" in the last 72 hours. ------------------------------------------------------------------------------------------------------------------ Cardiac Enzymes No results for input(s): "CKMB", "TROPONINI", "MYOGLOBIN" in the last 168 hours.  Invalid input(s): "CK"   Radiology Report No results found.    Signature  -   Susa Raring M.D on 06/05/2023 at 9:02 AM   -  To page go to [email or web address]

## 2023-06-06 DIAGNOSIS — R6521 Severe sepsis with septic shock: Secondary | ICD-10-CM | POA: Diagnosis not present

## 2023-06-06 DIAGNOSIS — A419 Sepsis, unspecified organism: Secondary | ICD-10-CM | POA: Diagnosis not present

## 2023-06-06 LAB — COMPREHENSIVE METABOLIC PANEL
ALT: 32 U/L (ref 0–44)
AST: 38 U/L (ref 15–41)
Albumin: 2 g/dL — ABNORMAL LOW (ref 3.5–5.0)
Alkaline Phosphatase: 61 U/L (ref 38–126)
Anion gap: 9 (ref 5–15)
BUN: 26 mg/dL — ABNORMAL HIGH (ref 8–23)
CO2: 26 mmol/L (ref 22–32)
Calcium: 8.2 mg/dL — ABNORMAL LOW (ref 8.9–10.3)
Chloride: 110 mmol/L (ref 98–111)
Creatinine, Ser: 1.27 mg/dL — ABNORMAL HIGH (ref 0.61–1.24)
GFR, Estimated: 60 mL/min — ABNORMAL LOW (ref >60)
Glucose, Bld: 95 mg/dL (ref 70–99)
Potassium: 3.7 mmol/L (ref 3.5–5.1)
Sodium: 145 mmol/L (ref 135–145)
Total Bilirubin: 0.4 mg/dL (ref 0.0–1.2)
Total Protein: 5.8 g/dL — ABNORMAL LOW (ref 6.5–8.1)

## 2023-06-06 LAB — MAGNESIUM: Magnesium: 1.8 mg/dL (ref 1.7–2.4)

## 2023-06-06 MED ORDER — SODIUM CHLORIDE 0.9 % IV SOLN
1.0000 g | Freq: Three times a day (TID) | INTRAVENOUS | Status: AC
  Administered 2023-06-06 – 2023-06-08 (×7): 1 g via INTRAVENOUS
  Filled 2023-06-06 (×7): qty 20

## 2023-06-06 MED ORDER — CHLORHEXIDINE GLUCONATE CLOTH 2 % EX PADS
6.0000 | MEDICATED_PAD | Freq: Every day | CUTANEOUS | Status: DC
  Administered 2023-06-07 – 2023-06-09 (×3): 6 via TOPICAL

## 2023-06-06 NOTE — TOC Progression Note (Addendum)
 Transition of Care [facility]) - Progression Note    Patient Details  Name: Kurt Walton MRN: [id number] Date of Birth: 1950-05-25  [facility] CM/SW Contact  Mearl Latin, LCSW Phone Number: 06/06/2023, 1:12 PM  Clinical Narrative:    CSW left secure voicemail for Kurt Walton with [facility] providing update. CSW updated [facility]. CSW left voicemail for patient's legal guardian, Kurt Walton ([phone]).   Kurt Walton returned call and stated for staff to contact her Monday to arrange [facility] to transport patient to [facility].  Kurt Walton returned call and CSW provided update. She will be off on Monday but stated staff can leave her a voicemail to make her aware of discharge.    Expected Discharge Plan: Skilled Nursing Facility Barriers to Discharge: Continued Medical Work up  Expected Discharge Plan and Services In-house Referral: Clinical Social Work Discharge Planning Services: CM Consult Post Acute Care Choice: Skilled Nursing Facility Living arrangements for the past 2 months: Skilled Nursing Facility                                       Social Determinants of Health (SDOH) Interventions SDOH Screenings   Food Insecurity: Patient Unable To Answer (06/03/2023)  Housing: Unknown (05/05/2023)  Transportation Needs: No Transportation Needs (05/05/2023)  Utilities: Not At Risk (05/05/2023)  Social Connections: Patient Unable To Answer (05/05/2023)  Tobacco Use: Medium Risk (06/01/2023)    Readmission Risk Interventions     No data to display

## 2023-06-06 NOTE — Plan of Care (Signed)
 Pt has rested quietly throughout the night with no distress noted. Alert and oriented to person. On room air. SR on the monitor. Foley cath intact to BSD. No complaints voiced.     Problem: Metabolic: Goal: Ability to maintain appropriate glucose levels will improve Outcome: Progressing   Problem: Skin Integrity: Goal: Risk for impaired skin integrity will decrease Outcome: Progressing   Problem: Clinical Measurements: Goal: Respiratory complications will improve Outcome: Progressing Goal: Cardiovascular complication will be avoided Outcome: Progressing

## 2023-06-06 NOTE — Progress Notes (Signed)
 PHARMACY NOTE:  ANTIMICROBIAL RENAL DOSAGE ADJUSTMENT  Current antimicrobial regimen includes a mismatch between antimicrobial dosage and estimated renal function.  As per policy approved by the Pharmacy & Therapeutics and Medical Executive Committees, the antimicrobial dosage will be adjusted accordingly.  Current antimicrobial dosage:  Merrem 1gm IV Q12H  Indication: ESBL E.coli UTI  Renal Function:  Estimated Creatinine Clearance: 51.8 mL/min (A) (by C-G formula based on SCr of 1.27 mg/dL (H)). []      On intermittent HD, scheduled: []      On CRRT    Antimicrobial dosage has been changed to:  Merrem 1gm IV Q8H through 3/16   Negin Hegg D. Laney Potash, PharmD, BCPS, BCCCP 06/06/2023, 7:32 PM

## 2023-06-06 NOTE — Progress Notes (Signed)
 PROGRESS NOTE                                                                                                                                                                                                             Patient Demographics:    Kurt Walton, is a 73 y.o. male, DOB - [birth year], [id number]  Outpatient Primary MD for the patient is Jethro Bastos, MD    LOS - 5  Admit date - 06/01/2023    Chief Complaint  Patient presents with   Altered Mental Status   weakness       Brief Narrative (HPI from H&P)   73 y.o. M from [place] with PMH significant for HTN, cerebral aneurysm and CVA with residual hemiparesis, dementia and recent hospitalization after a fall and found to have bladder distension and severe phimosis and AKI and was discharged back with an indwelling foley.  He was brought back to the ED 3/9 with worsening mental status a fever.  Purulent discharge around foley noted.  Work-up was significant for hgb, leuks and bacteria on UA, WBC 12.6, lactic acid 1.1.  His foley was exchanged and he was given 3L IVF with persistent hypotension, so critical care was consulted as he required Levophed to maintain adequate MAP's.  He was admitted for with sepsis caused by UTI in the presence of indwelling Foley catheter, he was stabilized in ICU and transferred to my care on 06/03/2023.   Subjective:   Patient in bed, appears comfortable, denies any headache, no fever, no chest pain or pressure, no shortness of breath , no abdominal pain. No focal weakness.  Assessment  & Plan :   Septic shock secondary to ESBL UTI with bacteremia likely due to underlying urinary retention and indwelling Foley catheter present on admission. -In the setting of indwelling catheter,Foley exchanged on admission.  Outpatient ESBL E. coli urine cultures noted from urology office done 1 week ago, patient currently on meropenem and clinically  responding well.  He has E. coli bacteremia, urine culture has Staph aureus(contaminant) along with gram-negative rods as well likely again E. coli.  Continue Flomax, Foley was changed in the ER, outpatient urology follow-up, he has had indwelling Foley for several weeks, appreciate ID input.  Total 7 days of meropenem, last date 06/08/2023.   AKI, Hypokalemia, Significant phimosis - Foley &  Flomax as above, gentle IV fluids, function close to baseline.  Has underlying CKD 3B with baseline creatinine around 1.6.   Mildly elevated transaminases - Intermittently trend LFTs, likely due to sepsis and hypoperfusion.   HX of stroke with left-sided hemiparesis along with underlying baseline Dementia - pleasantly confused, be his baseline, has left-sided hemiparesis, will resume aspirin and statin, PT-OT and SLP follow-up.    HLD  :  Continue home pravastatin   Moderate protein calorie malnutrition - supportive care        Condition - Extremely Guarded  Family Communication  :   Called and message left for daughter [phone]  on 06/03/2023 at 9 AM, updated in detail on 06/04/2023, DNR   DW Legal Guardian [facility] Department of Social Services. [place] [phone] -  DNR  Code Status : DNR  Consults  : ID, PCCM  PUD Prophylaxis :     Procedures  :      CT - 1. Fluid-filled distal colon and rectal vault in keeping with a diarrheal state. 2. Interval placement of Foley catheter with decompression of the bladder. Previously noted bilateral hydroureteronephrosis has resolved. 3. Moderate prostatic hypertrophy with changes of chronic bladder outlet obstruction. Aortic Atherosclerosis (ICD10-I70.0).      Disposition Plan  :    Status is: Inpatient   DVT Prophylaxis  :    heparin injection 5,000 Units Start: 06/01/23 2200 SCDs Start: 06/01/23 2128   Lab Results  Component Value Date   PLT 159 06/05/2023    Diet :  Diet Order             Diet regular Room service  appropriate? Yes with Assist; Fluid consistency: Thin  Diet effective now                    Inpatient Medications  Scheduled Meds:  aspirin EC  81 mg Oral Daily   feeding supplement  237 mL Oral BID BM   heparin  5,000 Units Subcutaneous Q8H   pravastatin  80 mg Oral QHS   tamsulosin  0.4 mg Oral Daily   Continuous Infusions:  meropenem (MERREM) IV 1 g (06/06/23 0952)   PRN Meds:.acetaminophen, docusate sodium, melatonin, mouth rinse, polyethylene glycol   Objective:   Vitals:   06/06/23 0351 06/06/23 0500 06/06/23 0738 06/06/23 0800  BP: 119/73  117/72 112/73  Pulse:    61  Resp: 15  18 15   Temp: 98.7 F (37.1 C)   97.9 F (36.6 C)  TempSrc: Oral   Oral  SpO2:    100%  Weight:  80.3 kg    Height:        Wt Readings from Last 3 Encounters:  06/06/23 80.3 kg  05/05/23 83.8 kg  01/04/21 86 kg     Intake/Output Summary (Last 24 hours) at 06/06/2023 1102 Last data filed at 06/06/2023 0551 Gross per 24 hour  Intake 800 ml  Output 2900 ml  Net -2100 ml     Physical Exam  Awake but confused, No new F.N deficits, L sided weakness, Foley,  [place].AT,PERRAL Supple Neck, No JVD,   Symmetrical Chest wall movement, Good air movement bilaterally, CTAB RRR,No Gallops,Rubs or new Murmurs,  +ve B.Sounds, Abd Soft, No tenderness,   No Cyanosis, Clubbing or edema     Data Review:    Recent Labs  Lab 06/01/23 1600 06/01/23 2242 06/02/23 0343 06/03/23 0758 06/04/23 0504 06/05/23 0453  WBC 12.6* 12.0* 9.9 8.0 10.6* 10.1  HGB  10.6* 9.5* 8.8* 8.6* 9.5* 9.8*  HCT 33.7* 30.4* 28.3* 26.5* 30.1* 30.8*  PLT 166 138* 133* 140* 176 159  MCV 84.0 84.2 83.5 80.8 82.0 81.3  MCH 26.4 26.3 26.0 26.2 25.9* 25.9*  MCHC 31.5 31.3 31.1 32.5 31.6 31.8  RDW 14.7 14.8 14.7 14.7 14.9 15.0  LYMPHSABS 0.6*  --   --  1.3 1.1 1.6  MONOABS 0.6  --   --  0.6 0.1 0.4  EOSABS 0.0  --   --  0.0 0.2 0.0  BASOSABS 0.0  --   --  0.0 0.0 0.1    Recent Labs  Lab 06/01/23 1600  06/01/23 1624 06/01/23 2158 06/01/23 2242 06/02/23 0343 06/03/23 0758 06/04/23 0504 06/05/23 0453  NA 141  --   --   --  142 145 143 141  K 3.2*  --   --   --  3.6 3.4* 4.4 3.5  CL 106  --   --   --  109 111 114* 108  CO2 22  --   --   --  21* 28 23 28   ANIONGAP 13  --   --   --  12 6 6 5   GLUCOSE 112*  --   --   --  103* 100* 105* 83  BUN 57*  --   --   --  51* 42* 36* 30*  CREATININE 3.08*  --  1.88*  --  2.64* 2.08* 1.85* 1.45*  AST 82*  --   --   --   --   --  54* 59*  ALT 50*  --   --   --   --   --  31 41  ALKPHOS 99  --   --   --   --   --  68 69  BILITOT 0.9  --   --   --   --   --  0.8 0.5  ALBUMIN 1.9*  --   --   --   --   --  2.2* 2.2*  CRP  --   --   --   --   --  14.2* 12.7* 9.9*  PROCALCITON  --   --   --   --   --  19.48 10.77 5.49  LATICACIDVEN  --  1.1  --   --   --   --   --   --   INR 1.4*  --   --   --   --   --   --   --   HGBA1C  --   --   --  5.9*  --   --   --   --   BNP  --   --   --   --   --  700.0* 483.5* 308.1*  MG  --   --   --   --  2.3 2.4 2.2 2.0  PHOS  --   --   --   --  4.6  --  2.7 2.9  CALCIUM 7.4*  --   --   --  7.1* 7.9* 7.9* 8.0*      Recent Labs  Lab 06/01/23 1600 06/01/23 1624 06/01/23 2242 06/02/23 0343 06/03/23 0758 06/04/23 0504 06/05/23 0453  CRP  --   --   --   --  14.2* 12.7* 9.9*  PROCALCITON  --   --   --   --  19.48 10.77 5.49  LATICACIDVEN  --  1.1  --   --   --   --   --   INR 1.4*  --   --   --   --   --   --   HGBA1C  --   --  5.9*  --   --   --   --   BNP  --   --   --   --  700.0* 483.5* 308.1*  MG  --   --   --  2.3 2.4 2.2 2.0  CALCIUM 7.4*  --   --  7.1* 7.9* 7.9* 8.0*    --------------------------------------------------------------------------------------------------------------- Lab Results  Component Value Date   CHOL 195 02/06/2012   HDL 82 02/06/2012   LDLCALC 97 02/06/2012   TRIG 82 02/06/2012   CHOLHDL 2.4 02/06/2012    Lab Results  Component Value Date   HGBA1C 5.9 (H) 06/01/2023    No results for input(s): "TSH", "T4TOTAL", "FREET4", "T3FREE", "THYROIDAB" in the last 72 hours. No results for input(s): "VITAMINB12", "FOLATE", "FERRITIN", "TIBC", "IRON", "RETICCTPCT" in the last 72 hours. ------------------------------------------------------------------------------------------------------------------ Cardiac Enzymes No results for input(s): "CKMB", "TROPONINI", "MYOGLOBIN" in the last 168 hours.  Invalid input(s): "CK"   Radiology Report No results found.    Signature  -   Susa Raring M.D on 06/06/2023 at 11:02 AM   -  To page go to [email or web address]

## 2023-06-06 NOTE — Plan of Care (Signed)

## 2023-06-07 DIAGNOSIS — A419 Sepsis, unspecified organism: Secondary | ICD-10-CM | POA: Diagnosis not present

## 2023-06-07 DIAGNOSIS — R6521 Severe sepsis with septic shock: Secondary | ICD-10-CM | POA: Diagnosis not present

## 2023-06-07 LAB — COMPREHENSIVE METABOLIC PANEL
ALT: 33 U/L (ref 0–44)
AST: 40 U/L (ref 15–41)
Albumin: 2.2 g/dL — ABNORMAL LOW (ref 3.5–5.0)
Alkaline Phosphatase: 58 U/L (ref 38–126)
Anion gap: 6 (ref 5–15)
BUN: 24 mg/dL — ABNORMAL HIGH (ref 8–23)
CO2: 28 mmol/L (ref 22–32)
Calcium: 8.3 mg/dL — ABNORMAL LOW (ref 8.9–10.3)
Chloride: 111 mmol/L (ref 98–111)
Creatinine, Ser: 1.16 mg/dL (ref 0.61–1.24)
GFR, Estimated: >60 mL/min (ref >60)
Glucose, Bld: 84 mg/dL (ref 70–99)
Potassium: 3.8 mmol/L (ref 3.5–5.1)
Sodium: 145 mmol/L (ref 135–145)
Total Bilirubin: 0.5 mg/dL (ref 0.0–1.2)
Total Protein: 6.2 g/dL — ABNORMAL LOW (ref 6.5–8.1)

## 2023-06-07 LAB — MAGNESIUM: Magnesium: 1.7 mg/dL (ref 1.7–2.4)

## 2023-06-07 NOTE — Progress Notes (Signed)
 PROGRESS NOTE                                                                                                                                                                                                             Patient Demographics:    Kurt Walton, is a 73 y.o. male, DOB - 05-03-1950, WUJ:[id number]  Outpatient Primary MD for the patient is Jethro Bastos, MD    LOS - 6  Admit date - 06/01/2023    Chief Complaint  Patient presents with   Altered Mental Status   weakness       Brief Narrative (HPI from H&P)   73 y.o. M from [place] with PMH significant for HTN, cerebral aneurysm and CVA with residual hemiparesis, dementia and recent hospitalization after a fall and found to have bladder distension and severe phimosis and AKI and was discharged back with an indwelling foley.  He was brought back to the ED 3/9 with worsening mental status a fever.  Purulent discharge around foley noted.  Work-up was significant for hgb, leuks and bacteria on UA, WBC 12.6, lactic acid 1.1.  His foley was exchanged and he was given 3L IVF with persistent hypotension, so critical care was consulted as he required Levophed to maintain adequate MAP's.  He was admitted for with sepsis caused by UTI in the presence of indwelling Foley catheter, he was stabilized in ICU and transferred to my care on 06/03/2023.   Subjective:   Patient in bed, appears comfortable, denies any headache, no fever, no chest pain or pressure, no shortness of breath , no abdominal pain. No new focal weakness.  Somewhat confused.   Assessment  & Plan :   Septic shock secondary to ESBL UTI with bacteremia likely due to underlying urinary retention and indwelling Foley catheter present on admission. -In the setting of indwelling catheter,Foley exchanged on admission.  Outpatient ESBL E. coli urine cultures noted from urology office done 1 week ago, patient currently on  meropenem and clinically responding well.  He has E. coli bacteremia, urine culture has Staph aureus(contaminant) along with gram-negative rods as well likely again E. coli.  Continue Flomax, Foley was changed in the ER, outpatient urology follow-up, he has had indwelling Foley for several weeks, appreciate ID input.  Total 7 days of meropenem, last date 06/08/2023.   AKI,  Hypokalemia, Significant phimosis - Foley & Flomax as above, gentle IV fluids, function close to baseline.  Has underlying CKD 3B with baseline creatinine around 1.6.   Mildly elevated transaminases - Intermittently trend LFTs, likely due to sepsis and hypoperfusion.   HX of stroke with left-sided hemiparesis along with underlying baseline Dementia - pleasantly confused, be his baseline, has left-sided hemiparesis, will resume aspirin and statin, PT-OT and SLP follow-up.    HLD  :  Continue home pravastatin   Moderate protein calorie malnutrition - supportive care        Condition - Extremely Guarded  Family Communication  :   Called and message left for daughter [phone]  on 06/03/2023 at 9 AM, updated in detail on 06/04/2023, DNR   DW Legal Guardian [facility] Department of Social Services. [place] [phone] -  DNR  Code Status : DNR  Consults  : ID, PCCM  PUD Prophylaxis :     Procedures  :      CT - 1. Fluid-filled distal colon and rectal vault in keeping with a diarrheal state. 2. Interval placement of Foley catheter with decompression of the bladder. Previously noted bilateral hydroureteronephrosis has resolved. 3. Moderate prostatic hypertrophy with changes of chronic bladder outlet obstruction. Aortic Atherosclerosis (ICD10-I70.0).      Disposition Plan  :    Status is: Inpatient   DVT Prophylaxis  :    heparin injection 5,000 Units Start: 06/01/23 2200 SCDs Start: 06/01/23 2128   Lab Results  Component Value Date   PLT 159 06/05/2023    Diet :  Diet Order              Diet regular Room service appropriate? Yes with Assist; Fluid consistency: Thin  Diet effective now                    Inpatient Medications  Scheduled Meds:  aspirin EC  81 mg Oral Daily   Chlorhexidine Gluconate Cloth  6 each Topical Daily   feeding supplement  237 mL Oral BID BM   heparin  5,000 Units Subcutaneous Q8H   pravastatin  80 mg Oral QHS   tamsulosin  0.4 mg Oral Daily   Continuous Infusions:  meropenem (MERREM) IV 1 g (06/07/23 0530)   PRN Meds:.acetaminophen, docusate sodium, melatonin, mouth rinse, polyethylene glycol   Objective:   Vitals:   06/07/23 0000 06/07/23 0400 06/07/23 0500 06/07/23 0823  BP: 127/77 120/69  118/74  Pulse: 66 61  64  Resp: 18 17  19   Temp: (!) 97.5 F (36.4 C) 98.3 F (36.8 C)  97.6 F (36.4 C)  TempSrc: Axillary Oral  Oral  SpO2: 100% 100%  100%  Weight:   78.8 kg   Height:        Wt Readings from Last 3 Encounters:  06/07/23 78.8 kg  05/05/23 83.8 kg  01/04/21 86 kg     Intake/Output Summary (Last 24 hours) at 06/07/2023 0905 Last data filed at 06/07/2023 0546 Gross per 24 hour  Intake 340 ml  Output 3050 ml  Net -2710 ml     Physical Exam  Awake but confused, No new F.N deficits, L sided weakness, Foley,  [place].AT,PERRAL Supple Neck, No JVD,   Symmetrical Chest wall movement, Good air movement bilaterally, CTAB RRR,No Gallops,Rubs or new Murmurs,  +ve B.Sounds, Abd Soft, No tenderness,   No Cyanosis, Clubbing or edema     Data Review:    Recent Labs  Lab 06/01/23 1600  06/01/23 2242 06/02/23 0343 06/03/23 0758 06/04/23 0504 06/05/23 0453  WBC 12.6* 12.0* 9.9 8.0 10.6* 10.1  HGB 10.6* 9.5* 8.8* 8.6* 9.5* 9.8*  HCT 33.7* 30.4* 28.3* 26.5* 30.1* 30.8*  PLT 166 138* 133* 140* 176 159  MCV 84.0 84.2 83.5 80.8 82.0 81.3  MCH 26.4 26.3 26.0 26.2 25.9* 25.9*  MCHC 31.5 31.3 31.1 32.5 31.6 31.8  RDW 14.7 14.8 14.7 14.7 14.9 15.0  LYMPHSABS 0.6*  --   --  1.3 1.1 1.6  MONOABS 0.6  --   --  0.6 0.1 0.4   EOSABS 0.0  --   --  0.0 0.2 0.0  BASOSABS 0.0  --   --  0.0 0.0 0.1    Recent Labs  Lab 06/01/23 1600 06/01/23 1624 06/01/23 2158 06/01/23 2242 06/02/23 0343 06/03/23 0758 06/04/23 0504 06/05/23 0453 06/06/23 1604  NA 141  --   --   --  142 145 143 141 145  K 3.2*  --   --   --  3.6 3.4* 4.4 3.5 3.7  CL 106  --   --   --  109 111 114* 108 110  CO2 22  --   --   --  21* 28 23 28 26   ANIONGAP 13  --   --   --  12 6 6 5 9   GLUCOSE 112*  --   --   --  103* 100* 105* 83 95  BUN 57*  --   --   --  51* 42* 36* 30* 26*  CREATININE 3.08*  --    < >  --  2.64* 2.08* 1.85* 1.45* 1.27*  AST 82*  --   --   --   --   --  54* 59* 38  ALT 50*  --   --   --   --   --  31 41 32  ALKPHOS 99  --   --   --   --   --  68 69 61  BILITOT 0.9  --   --   --   --   --  0.8 0.5 0.4  ALBUMIN 1.9*  --   --   --   --   --  2.2* 2.2* 2.0*  CRP  --   --   --   --   --  14.2* 12.7* 9.9*  --   PROCALCITON  --   --   --   --   --  19.48 10.77 5.49  --   LATICACIDVEN  --  1.1  --   --   --   --   --   --   --   INR 1.4*  --   --   --   --   --   --   --   --   HGBA1C  --   --   --  5.9*  --   --   --   --   --   BNP  --   --   --   --   --  700.0* 483.5* 308.1*  --   MG  --   --   --   --  2.3 2.4 2.2 2.0 1.8  PHOS  --   --   --   --  4.6  --  2.7 2.9  --   CALCIUM 7.4*  --   --   --  7.1* 7.9* 7.9* 8.0* 8.2*   < > =  values in this interval not displayed.      Recent Labs  Lab 06/01/23 1600 06/01/23 1624 06/01/23 2242 06/02/23 0343 06/03/23 0758 06/04/23 0504 06/05/23 0453 06/06/23 1604  CRP  --   --   --   --  14.2* 12.7* 9.9*  --   PROCALCITON  --   --   --   --  19.48 10.77 5.49  --   LATICACIDVEN  --  1.1  --   --   --   --   --   --   INR 1.4*  --   --   --   --   --   --   --   HGBA1C  --   --  5.9*  --   --   --   --   --   BNP  --   --   --   --  700.0* 483.5* 308.1*  --   MG  --   --   --  2.3 2.4 2.2 2.0 1.8  CALCIUM 7.4*  --   --  7.1* 7.9* 7.9* 8.0* 8.2*     --------------------------------------------------------------------------------------------------------------- Lab Results  Component Value Date   CHOL 195 02/06/2012   HDL 82 02/06/2012   LDLCALC 97 02/06/2012   TRIG 82 02/06/2012   CHOLHDL 2.4 02/06/2012    Lab Results  Component Value Date   HGBA1C 5.9 (H) 06/01/2023   No results for input(s): "TSH", "T4TOTAL", "FREET4", "T3FREE", "THYROIDAB" in the last 72 hours. No results for input(s): "VITAMINB12", "FOLATE", "FERRITIN", "TIBC", "IRON", "RETICCTPCT" in the last 72 hours. ------------------------------------------------------------------------------------------------------------------ Cardiac Enzymes No results for input(s): "CKMB", "TROPONINI", "MYOGLOBIN" in the last 168 hours.  Invalid input(s): "CK"   Radiology Report No results found.    Signature  -   Susa Raring M.D on 06/07/2023 at 9:05 AM   -  To page go to [email or web address]

## 2023-06-07 NOTE — Plan of Care (Signed)
 Pt has rested quietly throughout the night with no distress noted, after a few hours of trying to get OOB, pulling on cords and pulse ox. Alert and oriented to self. Verbalizes some, hard to understand. On room air. Foley cath intact to BSD. Rectal pouch on with liquid stool. No complaints voiced.     Problem: Fluid Volume: Goal: Ability to maintain a balanced intake and output will improve Outcome: Progressing   Problem: Clinical Measurements: Goal: Respiratory complications will improve Outcome: Progressing Goal: Cardiovascular complication will be avoided Outcome: Progressing   Problem: Pain Managment: Goal: General experience of comfort will improve and/or be controlled Outcome: Progressing   Problem: Safety: Goal: Ability to remain free from injury will improve Outcome: Progressing

## 2023-06-07 NOTE — Plan of Care (Signed)

## 2023-06-08 DIAGNOSIS — A419 Sepsis, unspecified organism: Secondary | ICD-10-CM | POA: Diagnosis not present

## 2023-06-08 DIAGNOSIS — R6521 Severe sepsis with septic shock: Secondary | ICD-10-CM | POA: Diagnosis not present

## 2023-06-08 NOTE — Plan of Care (Signed)

## 2023-06-08 NOTE — Progress Notes (Signed)
 PROGRESS NOTE                                                                                                                                                                                                             Patient Demographics:    Kurt Walton, is a 73 y.o. male, DOB - [birth year], [id number]  Outpatient Primary MD for the patient is Jethro Bastos, MD    LOS - 7  Admit date - 06/01/2023    Chief Complaint  Patient presents with   Altered Mental Status   weakness       Brief Narrative (HPI from H&P)   73 y.o. 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  He was brought back to the ED 3/9 with worsening mental status a fever.  Purulent discharge around foley noted.  Work-up was significant for hgb, leuks and bacteria on UA, WBC 12.6, lactic acid 1.1.  His foley was exchanged and he was given 3L IVF with persistent hypotension, so critical care was consulted as he required Levophed to maintain adequate MAP's.  He was admitted for with sepsis caused by UTI in the presence of indwelling Foley catheter, he was stabilized in ICU and transferred to my care on 06/03/2023.   Subjective:   Patient in bed remains confused, denies any headache chest or abdominal pain.   Assessment  & Plan :   Septic shock secondary to ESBL UTI with bacteremia likely due to underlying urinary retention and indwelling Foley catheter present on admission. -In the setting of indwelling catheter,Foley exchanged on admission.  Outpatient ESBL E. coli urine cultures noted from urology office done 1 week ago, patient currently on meropenem and clinically responding well.  He has E. coli bacteremia, urine culture has Staph  aureus(contaminant) along with gram-negative rods as well likely again E. coli.  Continue Flomax, Foley was changed in the ER, outpatient urology follow-up, he has had indwelling Foley for several weeks, appreciate ID input.  Total 7 days of meropenem, last date 06/08/2023 in the evening.   AKI, Hypokalemia, Significant phimosis - Foley & Flomax as above, gentle IV fluids, function close to  baseline.  Has underlying CKD 3B with baseline creatinine around 1.6.   Mildly elevated transaminases - Intermittently trend LFTs, likely due to sepsis and hypoperfusion.   HX of stroke with left-sided hemiparesis along with underlying baseline Dementia - pleasantly confused, be his baseline, has left-sided hemiparesis, will resume aspirin and statin, PT-OT and SLP follow-up.    HLD  :  Continue home pravastatin   Moderate protein calorie malnutrition - supportive care        Condition - Extremely Guarded  Family Communication  :   Called and message left for daughter [phone]  on 06/03/2023 at 9 AM, updated in detail on 06/04/2023, DNR   DW Legal Guardian [facility] Department of Social Services. [place] [phone] -  DNR  Code Status : DNR  Consults  : ID, PCCM  PUD Prophylaxis :     Procedures  :      CT - 1. Fluid-filled distal colon and rectal vault in keeping with a diarrheal state. 2. Interval placement of Foley catheter with decompression of the bladder. Previously noted bilateral hydroureteronephrosis has resolved. 3. Moderate prostatic hypertrophy with changes of chronic bladder outlet obstruction. Aortic Atherosclerosis (ICD10-I70.0).      Disposition Plan  :    Status is: Inpatient   DVT Prophylaxis  :    heparin injection 5,000 Units Start: 06/01/23 2200 SCDs Start: 06/01/23 2128   Lab Results  Component Value Date   PLT 159 06/05/2023    Diet :  Diet Order             Diet regular Room service appropriate? Yes with Assist; Fluid consistency: Thin   Diet effective now                    Inpatient Medications  Scheduled Meds:  aspirin EC  81 mg Oral Daily   Chlorhexidine Gluconate Cloth  6 each Topical Daily   feeding supplement  237 mL Oral BID BM   heparin  5,000 Units Subcutaneous Q8H   pravastatin  80 mg Oral QHS   tamsulosin  0.4 mg Oral Daily   Continuous Infusions:  meropenem (MERREM) IV 1 g (06/08/23 0614)   PRN Meds:.acetaminophen, docusate sodium, melatonin, mouth rinse, polyethylene glycol   Objective:   Vitals:   06/08/23 0012 06/08/23 0435 06/08/23 0600 06/08/23 0832  BP:  112/68  129/72  Pulse: 60 71  61  Resp:    18  Temp:  97.8 F (36.6 C)  98.2 F (36.8 C)  TempSrc:  Oral  Oral  SpO2: 99% 100%  100%  Weight: 79.2 kg  78.3 kg   Height:        Wt Readings from Last 3 Encounters:  06/08/23 78.3 kg  05/05/23 83.8 kg  01/04/21 86 kg     Intake/Output Summary (Last 24 hours) at 06/08/2023 0939 Last data filed at 06/08/2023 0600 Gross per 24 hour  Intake 120 ml  Output 1050 ml  Net -930 ml     Physical Exam  Awake but confused, No new F.N deficits, L sided weakness, Foley,  [place].AT,PERRAL Supple Neck, No JVD,   Symmetrical Chest wall movement, Good air movement bilaterally, CTAB RRR,No Gallops,Rubs or new Murmurs,  +ve B.Sounds, Abd Soft, No tenderness,   No Cyanosis, Clubbing or edema     Data Review:    Recent Labs  Lab 06/01/23 1600 06/01/23 2242 06/02/23 0343 06/03/23 0758 06/04/23 0504 06/05/23 0453  WBC 12.6* 12.0* 9.9 8.0 10.6* 10.1  HGB 10.6* 9.5* 8.8* 8.6* 9.5* 9.8*  HCT 33.7* 30.4* 28.3* 26.5* 30.1* 30.8*  PLT 166 138* 133* 140* 176 159  MCV 84.0 84.2 83.5 80.8 82.0 81.3  MCH 26.4 26.3 26.0 26.2 25.9* 25.9*  MCHC 31.5 31.3 31.1 32.5 31.6 31.8  RDW 14.7 14.8 14.7 14.7 14.9 15.0  LYMPHSABS 0.6*  --   --  1.3 1.1 1.6  MONOABS 0.6  --   --  0.6 0.1 0.4  EOSABS 0.0  --   --  0.0 0.2 0.0  BASOSABS 0.0  --   --  0.0 0.0 0.1    Recent Labs  Lab 06/01/23 1600  06/01/23 1600 06/01/23 1624 06/01/23 2158 06/01/23 2242 06/02/23 0343 06/03/23 0758 06/04/23 0504 06/05/23 0453 06/06/23 1604 06/07/23 0933  NA 141  --   --   --   --  142 145 143 141 145 145  K 3.2*  --   --   --   --  3.6 3.4* 4.4 3.5 3.7 3.8  CL 106  --   --   --   --  109 111 114* 108 110 111  CO2 22  --   --   --   --  21* 28 23 28 26 28   ANIONGAP 13  --   --   --   --  12 6 6 5 9 6   GLUCOSE 112*  --   --   --   --  103* 100* 105* 83 95 84  BUN 57*  --   --   --   --  51* 42* 36* 30* 26* 24*  CREATININE 3.08*  --   --    < >  --  2.64* 2.08* 1.85* 1.45* 1.27* 1.16  AST 82*  --   --   --   --   --   --  54* 59* 38 40  ALT 50*  --   --   --   --   --   --  31 41 32 33  ALKPHOS 99  --   --   --   --   --   --  68 69 61 58  BILITOT 0.9  --   --   --   --   --   --  0.8 0.5 0.4 0.5  ALBUMIN 1.9*  --   --   --   --   --   --  2.2* 2.2* 2.0* 2.2*  CRP  --   --   --   --   --   --  14.2* 12.7* 9.9*  --   --   PROCALCITON  --   --   --   --   --   --  19.48 10.77 5.49  --   --   LATICACIDVEN  --   --  1.1  --   --   --   --   --   --   --   --   INR 1.4*  --   --   --   --   --   --   --   --   --   --   HGBA1C  --   --   --   --  5.9*  --   --   --   --   --   --   BNP  --   --   --   --   --   --  700.0* 483.5* 308.1*  --   --   MG  --    < >  --   --   --  2.3 2.4 2.2 2.0 1.8 1.7  PHOS  --   --   --   --   --  4.6  --  2.7 2.9  --   --   CALCIUM 7.4*  --   --   --   --  7.1* 7.9* 7.9* 8.0* 8.2* 8.3*   < > = values in this interval not displayed.      Recent Labs  Lab 06/01/23 1600 06/01/23 1624 06/01/23 2242 06/02/23 0343 06/03/23 0758 06/04/23 0504 06/05/23 0453 06/06/23 1604 06/07/23 0933  CRP  --   --   --   --  14.2* 12.7* 9.9*  --   --   PROCALCITON  --   --   --   --  19.48 10.77 5.49  --   --   LATICACIDVEN  --  1.1  --   --   --   --   --   --   --   INR 1.4*  --   --   --   --   --   --   --   --   HGBA1C  --   --  5.9*  --   --   --   --   --   --   BNP   --   --   --   --  700.0* 483.5* 308.1*  --   --   MG  --   --   --    < > 2.4 2.2 2.0 1.8 1.7  CALCIUM 7.4*  --   --    < > 7.9* 7.9* 8.0* 8.2* 8.3*   < > = values in this interval not displayed.    --------------------------------------------------------------------------------------------------------------- Lab Results  Component Value Date   CHOL 195 02/06/2012   HDL 82 02/06/2012   LDLCALC 97 02/06/2012   TRIG 82 02/06/2012   CHOLHDL 2.4 02/06/2012    Lab Results  Component Value Date   HGBA1C 5.9 (H) 06/01/2023   No results for input(s): "TSH", "T4TOTAL", "FREET4", "T3FREE", "THYROIDAB" in the last 72 hours. No results for input(s): "VITAMINB12", "FOLATE", "FERRITIN", "TIBC", "IRON", "RETICCTPCT" in the last 72 hours. ------------------------------------------------------------------------------------------------------------------ Cardiac Enzymes No results for input(s): "CKMB", "TROPONINI", "MYOGLOBIN" in the last 168 hours.  Invalid input(s): "CK"   Radiology Report No results found.    Signature  -   Susa Raring M.D on 06/08/2023 at 9:39 AM   -  To page go to [email or web address]

## 2023-06-08 NOTE — Plan of Care (Signed)
 Pt has rested quietly throughout the night with no distress noted. Alert and oriented to self. On room air. SR on the monitor. Foley cath intact to BSD. Rectal pouch intact brown stool. Pt pulling at lines and tubes frequently. No complaints voiced.     Problem: Fluid Volume: Goal: Ability to maintain a balanced intake and output will improve Outcome: Progressing   Problem: Tissue Perfusion: Goal: Adequacy of tissue perfusion will improve Outcome: Progressing   Problem: Clinical Measurements: Goal: Respiratory complications will improve Outcome: Progressing Goal: Cardiovascular complication will be avoided Outcome: Progressing   Problem: Pain Managment: Goal: General experience of comfort will improve and/or be controlled Outcome: Progressing

## 2023-06-09 DIAGNOSIS — A419 Sepsis, unspecified organism: Secondary | ICD-10-CM | POA: Diagnosis not present

## 2023-06-09 DIAGNOSIS — R6521 Severe sepsis with septic shock: Secondary | ICD-10-CM | POA: Diagnosis not present

## 2023-06-09 NOTE — Discharge Instructions (Signed)
 Follow with Primary MD Jethro Bastos, MD in 7 days, follow-up with your urologist within a week of discharge.  Get CBC, CMP, Magnesium -  checked next visit with your primary MD or SNF MD   Activity: As tolerated with Full fall precautions use walker/cane & assistance as needed  Disposition SNF  Diet: Heart Healthy with feeding assistance and aspiration precautions.  Special Instructions: If you have smoked or chewed Tobacco  in the last 2 yrs please stop smoking, stop any regular Alcohol  and or any Recreational drug use.  On your next visit with your primary care physician please Get Medicines reviewed and adjusted.  Please request your Prim.MD to go over all Hospital Tests and Procedure/Radiological results at the follow up, please get all Hospital records sent to your Prim MD by signing hospital release before you go home.  If you experience worsening of your admission symptoms, develop shortness of breath, life threatening emergency, suicidal or homicidal thoughts you must seek medical attention immediately by calling 911 or calling your MD immediately  if symptoms less severe.  You Must read complete instructions/literature along with all the possible adverse reactions/side effects for all the Medicines you take and that have been prescribed to you. Take any new Medicines after you have completely understood and accpet all the possible adverse reactions/side effects.   Do not drive when taking Pain medications.  Do not take more than prescribed Pain, Sleep and Anxiety Medications  Wear Seat belts while driving.

## 2023-06-09 NOTE — Plan of Care (Signed)
 Pt has rested quietly throughout the night with no distress noted. Alert and oriented to self only. Pulls at lines and tubes, moves all about the bed with legs hanging off. Attempted to reorient frequently. Bed alarm on. On room air. SR on the monitor. Foley to BSD. Rectal pouch on. No complaints voiced.     Problem: Clinical Measurements: Goal: Respiratory complications will improve Outcome: Progressing Goal: Cardiovascular complication will be avoided Outcome: Progressing   Problem: Activity: Goal: Risk for activity intolerance will decrease Outcome: Progressing   Problem: Coping: Goal: Level of anxiety will decrease Outcome: Progressing   Problem: Pain Managment: Goal: General experience of comfort will improve and/or be controlled Outcome: Progressing

## 2023-06-09 NOTE — Discharge Summary (Signed)
 Kurt Walton ZOX:[id number] DOB: [birth year] DOA: 06/01/2023  PCP: Jethro Bastos, MD  Admit date: 06/01/2023  Discharge date: 06/09/2023  Admitted From: SNF   Disposition:  SNF   Recommendations for Outpatient Follow-up:   Follow up with PCP in 1-2 weeks  PCP Please obtain BMP/CBC, 2 view CXR in 1week,  (see Discharge instructions)   PCP Please follow up on the following pending results: Please arrange follow-up with his urologist within a week of discharge   Home Health: None Equipment/Devices: None Consultations: ID Discharge Condition: Stable    CODE STATUS: DNR   Diet Recommendation: Heart Healthy     Chief Complaint  Patient presents with   Altered Mental Status   weakness     Brief history of present illness from the day of admission and additional interim summary     73 y.o. 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  He was brought back to the ED 3/9 with worsening mental status a fever.  Purulent discharge around foley noted.  Work-up was significant for hgb, leuks and bacteria on UA, WBC 12.6, lactic acid 1.1.  His foley was exchanged and he was given 3L IVF with persistent hypotension, so critical care was consulted as he required Levophed to maintain adequate MAP's.  He was admitted for with sepsis caused by UTI in the presence of indwelling Foley catheter, he was stabilized in ICU and transferred to my care on 06/03/2023.                                                                  Hospital Course   Septic shock secondary to ESBL UTI with bacteremia likely due to  underlying urinary retention and indwelling Foley catheter present on admission. -In the setting of indwelling catheter, Foley exchanged on admission.  Outpatient ESBL E. coli urine cultures noted from urology office done 1 week ago, patient currently on meropenem and clinically responding well.  He has E. coli bacteremia, urine culture has Staph aureus(contaminant) along with gram-negative rods as well likely again E. coli.  Continue Flomax, Foley was changed in the ER, outpatient urology follow-up, he has had indwelling Foley for several weeks, appreciate ID input.  Total 7 days of meropenem, she finished last night, symptom-free will be discharged back to SNF with outpatient follow-up with his urologist within a week of discharge.   AKI, Hypokalemia, Significant phimosis - Foley & Flomax as above, gentle IV fluids, function close to baseline.  Has underlying CKD 3B with baseline creatinine around 1.6.   Mildly elevated transaminases - Intermittently trend LFTs, likely due  to sepsis and hypoperfusion.  Trend stable repeat CMP in a week at SNF.   HX of stroke with left-sided hemiparesis along with underlying baseline Dementia - pleasantly confused, be his baseline, has left-sided hemiparesis, will resume aspirin and statin, PT-OT and SLP follow-up to continue at [facility].    HLD  :  Continue home pravastatin   Moderate protein calorie malnutrition - supportive care protein supplementation to continue at [facility].    Discharge diagnosis     Principal Problem:   Septic shock [facility])    Discharge instructions    Discharge Instructions     Discharge instructions   Complete by: As directed    Follow with Primary MD Jethro Bastos, MD in 7 days, follow-up with your urologist within a week of discharge.  Get CBC, CMP, Magnesium -  checked next visit with your primary MD or SNF MD   Activity: As tolerated with Full fall precautions use walker/cane & assistance as needed  Disposition SNF  Diet:  Heart Healthy with feeding assistance and aspiration precautions.  Special Instructions: If you have smoked or chewed Tobacco  in the last 2 yrs please stop smoking, stop any regular Alcohol  and or any Recreational drug use.  On your next visit with your primary care physician please Get Medicines reviewed and adjusted.  Please request your Prim.MD to go over all Hospital Tests and Procedure/Radiological results at the follow up, please get all Hospital records sent to your Prim MD by signing hospital release before you go home.  If you experience worsening of your admission symptoms, develop shortness of breath, life threatening emergency, suicidal or homicidal thoughts you must seek medical attention immediately by calling 911 or calling your MD immediately  if symptoms less severe.  You Must read complete instructions/literature along with all the possible adverse reactions/side effects for all the Medicines you take and that have been prescribed to you. Take any new Medicines after you have completely understood and accpet all the possible adverse reactions/side effects.   Do not drive when taking Pain medications.  Do not take more than prescribed Pain, Sleep and Anxiety Medications  Wear Seat belts while driving.   Increase activity slowly   Complete by: As directed        Discharge Medications   Allergies as of 06/09/2023       Reactions   Tuberculin Purified Protein Derivative Other (See Comments)   "Allergic," per [facility]   Tuberculin Tests Other (See Comments)   "Allergic," per [facility]        Medication List     TAKE these medications    aspirin EC 81 MG tablet Take 81 mg by mouth in the morning. Swallow whole.   Cholecalciferol 1.25 MG (50000 UT) Tabs Take 1 tablet by mouth once a week. Take on Friday.   pravastatin 80 MG tablet Commonly known as: PRAVACHOL Take 80 mg by mouth at bedtime.   senna 8.6 MG Tabs tablet Commonly known as: SENOKOT Take 1 tablet by  mouth at bedtime.   tamsulosin 0.4 MG Caps capsule Commonly known as: FLOMAX Take 0.4 mg by mouth daily.   traZODone 50 MG tablet Commonly known as: DESYREL Take 25 mg by mouth at bedtime.   Vitamin D (Ergocalciferol) 1.25 MG (50000 UNIT) Caps capsule Commonly known as: DRISDOL Take 50,000 Units by mouth See admin instructions. Take 50,000 units by mouth on the 7th of every month         Contact information for  follow-up providers     Jethro Bastos, MD. Schedule an appointment as soon as possible for a visit in 1 week(s).   Specialty: Family Medicine Why: Follow-up with your urologist within a week of discharge. Contact information: [address] [place] [address] [phone]              Contact information for after-discharge care     Destination     [facility] Preferred SNF .   Service: Skilled Nursing Contact information: [address] [place] [address] [phone]                     Major procedures and Radiology Reports - PLEASE review detailed and final reports thoroughly  -      CT ABDOMEN PELVIS WO CONTRAST Result Date: 06/01/2023 CLINICAL DATA:  Sepsis EXAM: CT ABDOMEN AND PELVIS WITHOUT CONTRAST TECHNIQUE: Multidetector CT imaging of the abdomen and pelvis was performed following the standard protocol without IV contrast. RADIATION DOSE REDUCTION: This exam was performed according to the departmental dose-optimization program which includes automated exposure control, adjustment of the mA and/or kV according to patient size and/or use of iterative reconstruction technique. COMPARISON:  05/04/2023 FINDINGS: Lower chest: No acute abnormality. Hepatobiliary: Scattered hepatic hypodensities are again seen throughout the liver likely representing small attic cysts. No solid intrahepatic mass is clearly identified on this noncontrast examination. No intra or extrahepatic biliary ductal dilation. Gallbladder  is decompressed and is unremarkable. Pancreas: Unremarkable Spleen: Unremarkable Adrenals/Urinary Tract: Foley catheter balloon is now seen within the bladder lumen and the bladder is decompressed. Previously noted bilateral hydroureteronephrosis has resolved. The bladder wall is diffusely thickened in keeping with changes chronic bladder outlet obstruction. The kidneys are unremarkable in this noncontrast examination. The adrenal glands are unremarkable. Stomach/Bowel: The distal colon and rectal vault to fluid-filled keeping with a diarrheal state. The stomach, small bowel, and large are of all is unremarkable. Ventriculoperitoneal shunt catheter tubing is seen looped with pelvis with its tip within the right lower quadrant. No free intraperitoneal gas or fluid. Appendix normal. Vascular/Lymphatic: Aortic atherosclerosis. No enlarged abdominal or pelvic lymph nodes. Reproductive: Moderate prostatic hypertrophy again noted with estimated prostatic volume of 70-80 cc. Other: No abdominal wall hernia or abnormality. No abdominopelvic ascites. Musculoskeletal: Right femoral nail partially visualized. Moderate lumbar dextroscoliosis is present superimposed degenerative changes throughout the lumbar spine. Superimposed anterior wedge compression deformity of L1 is present with 50-60% loss of height anteriorly and no retropulsion, chronic in nature. No acute bone abnormality. No lytic or blastic bone lesion. IMPRESSION: 1. Fluid-filled distal colon and rectal vault in keeping with a diarrheal state. 2. Interval placement of Foley catheter with decompression of the bladder. Previously noted bilateral hydroureteronephrosis has resolved. 3. Moderate prostatic hypertrophy with changes of chronic bladder outlet obstruction. Aortic Atherosclerosis (ICD10-I70.0). Electronically Signed   By: Helyn Numbers M.D.   On: 06/01/2023 19:32   DG Chest Port 1 View Result Date: 06/01/2023 CLINICAL DATA:  Sepsis. EXAM: PORTABLE CHEST 1  VIEW COMPARISON:  X-ray 05/04/2023 FINDINGS: Underinflation. Stable cardiopericardial silhouette with calcified aorta. No pneumothorax or effusion. There is some asymmetric opacity at the left lung base. Infiltrate is possible. Recommend follow-up. Overlapping cardiac leads. There is vertical oriented tube along the right hemithorax. Please correlate for known history of a VP shunt. IMPRESSION: Underinflation with mild left lung base opacity. Infiltrates possible. Recommend follow-up. Known VP shunt. Electronically Signed   By: Piedad Climes.D.  On: 06/01/2023 16:24    Micro Results     Recent Results (from the past 240 hours)  Resp panel by RT-PCR (RSV, Flu A&B, Covid) Anterior Nasal Swab     Status: None   Collection Time: 06/01/23  3:46 PM   Specimen: Anterior Nasal Swab  Result Value Ref Range Status   SARS Coronavirus 2 by RT PCR NEGATIVE NEGATIVE Final   Influenza A by PCR NEGATIVE NEGATIVE Final   Influenza B by PCR NEGATIVE NEGATIVE Final    Comment: (NOTE) The Xpert Xpress SARS-CoV-2/FLU/RSV plus assay is intended as an aid in the diagnosis of influenza from Nasopharyngeal swab specimens and should not be used as a sole basis for treatment. Nasal washings and aspirates are unacceptable for Xpert Xpress SARS-CoV-2/FLU/RSV testing.  Fact Sheet for Patients: BloggerCourse.com  Fact Sheet for Healthcare Providers: SeriousBroker.it  This test is not yet approved or cleared by the [place] FDA and has been authorized for detection and/or diagnosis of SARS-CoV-2 by FDA under an Emergency Use Authorization (EUA). This EUA will remain in effect (meaning this test can be used) for the duration of the COVID-19 declaration under Section 564(b)(1) of the Act, 21 U.S.C. section 360bbb-3(b)(1), unless the authorization is terminated or revoked.     Resp Syncytial Virus by PCR NEGATIVE NEGATIVE Final    Comment: (NOTE) Fact Sheet  for Patients: BloggerCourse.com  Fact Sheet for Healthcare Providers: SeriousBroker.it  This test is not yet approved or cleared by the [place] FDA and has been authorized for detection and/or diagnosis of SARS-CoV-2 by FDA under an Emergency Use Authorization (EUA). This EUA will remain in effect (meaning this test can be used) for the duration of the COVID-19 declaration under Section 564(b)(1) of the Act, 21 U.S.C. section 360bbb-3(b)(1), unless the authorization is terminated or revoked.  Performed at [facility], [address], [place], [place] [address]   Blood Culture (routine x 2)     Status: Abnormal   Collection Time: 06/01/23  3:46 PM   Specimen: BLOOD  Result Value Ref Range Status   Specimen Description BLOOD RIGHT ANTECUBITAL  Final   Special Requests   Final    BOTTLES DRAWN AEROBIC AND ANAEROBIC Blood Culture results may not be optimal due to an inadequate volume of blood received in culture bottles   Culture  Setup Time   Final    GRAM NEGATIVE RODS IN BOTH AEROBIC AND ANAEROBIC BOTTLES CRITICAL RESULT CALLED TO, READ BACK BY AND VERIFIED WITH: PHARMD K LEDFORD 06/02/2023  @ 0514 BY AB Performed at [facility], [address] [place], [place] [address]    Culture (A)  Final    ESCHERICHIA COLI Confirmed Extended Spectrum Beta-Lactamase Producer (ESBL).  In bloodstream infections from ESBL organisms, carbapenems are preferred over piperacillin/tazobactam. They are shown to have a lower risk of mortality.    Report Status 06/04/2023 FINAL  Final   Organism ID, Bacteria ESCHERICHIA COLI  Final      Susceptibility   Escherichia coli - MIC*    AMPICILLIN >=32 RESISTANT Resistant     CEFEPIME 16 RESISTANT Resistant     CEFTAZIDIME RESISTANT Resistant     CEFTRIAXONE >=64 RESISTANT Resistant     CIPROFLOXACIN >=4 RESISTANT Resistant     GENTAMICIN >=16 RESISTANT Resistant     IMIPENEM <=0.25  SENSITIVE Sensitive     TRIMETH/SULFA <=20 SENSITIVE Sensitive     AMPICILLIN/SULBACTAM >=32 RESISTANT Resistant     PIP/TAZO 8 SENSITIVE  Sensitive ug/mL    * ESCHERICHIA COLI  Blood Culture ID Panel (Reflexed)     Status: Abnormal   Collection Time: 06/01/23  3:46 PM  Result Value Ref Range Status   Enterococcus faecalis NOT DETECTED NOT DETECTED Final   Enterococcus Faecium NOT DETECTED NOT DETECTED Final   Listeria monocytogenes NOT DETECTED NOT DETECTED Final   Staphylococcus species NOT DETECTED NOT DETECTED Final   Staphylococcus aureus (BCID) NOT DETECTED NOT DETECTED Final   Staphylococcus epidermidis NOT DETECTED NOT DETECTED Final   Staphylococcus lugdunensis NOT DETECTED NOT DETECTED Final   Streptococcus species NOT DETECTED NOT DETECTED Final   Streptococcus agalactiae NOT DETECTED NOT DETECTED Final   Streptococcus pneumoniae NOT DETECTED NOT DETECTED Final   Streptococcus pyogenes NOT DETECTED NOT DETECTED Final   A.calcoaceticus-baumannii NOT DETECTED NOT DETECTED Final   Bacteroides fragilis NOT DETECTED NOT DETECTED Final   Enterobacterales DETECTED (A) NOT DETECTED Final    Comment: Enterobacterales represent a large order of gram negative bacteria, not a single organism. CRITICAL RESULT CALLED TO, READ BACK BY AND VERIFIED WITH: PHARMD K LEDFORD 06/02/2023  @ 0514 BY AB    Enterobacter cloacae complex NOT DETECTED NOT DETECTED Final   Escherichia coli DETECTED (A) NOT DETECTED Final    Comment: CRITICAL RESULT CALLED TO, READ BACK BY AND VERIFIED WITH: PHARMD K LEDFORD 06/02/2023  @ 0514 BY AB    Klebsiella aerogenes NOT DETECTED NOT DETECTED Final   Klebsiella oxytoca NOT DETECTED NOT DETECTED Final   Klebsiella pneumoniae NOT DETECTED NOT DETECTED Final   Proteus species NOT DETECTED NOT DETECTED Final   Salmonella species NOT DETECTED NOT DETECTED Final   Serratia marcescens NOT DETECTED NOT DETECTED Final   Haemophilus influenzae NOT DETECTED NOT DETECTED  Final   Neisseria meningitidis NOT DETECTED NOT DETECTED Final   Pseudomonas aeruginosa NOT DETECTED NOT DETECTED Final   Stenotrophomonas maltophilia NOT DETECTED NOT DETECTED Final   Candida albicans NOT DETECTED NOT DETECTED Final   Candida auris NOT DETECTED NOT DETECTED Final   Candida glabrata NOT DETECTED NOT DETECTED Final   Candida krusei NOT DETECTED NOT DETECTED Final   Candida parapsilosis NOT DETECTED NOT DETECTED Final   Candida tropicalis NOT DETECTED NOT DETECTED Final   Cryptococcus neoformans/gattii NOT DETECTED NOT DETECTED Final   CTX-M ESBL DETECTED (A) NOT DETECTED Final    Comment: CRITICAL RESULT CALLED TO, READ BACK BY AND VERIFIED WITH: PHARMD K LEDFORD 06/02/2023  @ 0514 BY AB (NOTE) Extended spectrum beta-lactamase detected. Recommend a carbapenem as initial therapy.      Carbapenem resistance IMP NOT DETECTED NOT DETECTED Final   Carbapenem resistance KPC NOT DETECTED NOT DETECTED Final   Carbapenem resistance NDM NOT DETECTED NOT DETECTED Final   Carbapenem resist OXA 48 LIKE NOT DETECTED NOT DETECTED Final   Carbapenem resistance VIM NOT DETECTED NOT DETECTED Final    Comment: Performed at [facility], [address], [place], [place] [address]  Blood Culture (routine x 2)     Status: Abnormal   Collection Time: 06/01/23  3:51 PM   Specimen: BLOOD LEFT HAND  Result Value Ref Range Status   Specimen Description BLOOD LEFT HAND  Final   Special Requests   Final    BOTTLES DRAWN AEROBIC AND ANAEROBIC Blood Culture results may not be optimal due to an inadequate volume of blood received in culture bottles   Culture  Setup Time   Final    GRAM NEGATIVE RODS IN BOTH AEROBIC  AND ANAEROBIC BOTTLES CRITICAL VALUE NOTED.  VALUE IS CONSISTENT WITH PREVIOUSLY REPORTED AND CALLED VALUE.    Culture (A)  Final    ESCHERICHIA COLI SUSCEPTIBILITIES PERFORMED ON PREVIOUS CULTURE WITHIN THE LAST 5 DAYS. Performed at [facility], [address],  [place], [place] [address]    Report Status 06/04/2023 FINAL  Final  Urine Culture     Status: Abnormal   Collection Time: 06/01/23  5:07 PM   Specimen: Urine, Random  Result Value Ref Range Status   Specimen Description URINE, RANDOM  Final   Special Requests NONE Reflexed from [id number]  Final   Culture (A)  Final    MULTIPLE SPECIES PRESENT, SUGGEST RECOLLECTION PREVIOUSLY REPORTED AS: >=100,000 COLONIES/mL STAPHYLOCOCCUS AUREUS 70,000 COLONIES/mL GRAM NEGATIVE RODS IDENTIFICATION AND SUSCEPTIBILITIES TO FOLLOW CORRECTED RESULTS CALLED TO: RN Cala Bradford JOYNER ON 06/02/23 @ 1833 BY DRT Performed at [facility], [address], [place], [place] [address]    Report Status 06/02/2023 FINAL  Final  MRSA Next Gen by PCR, Nasal     Status: Abnormal   Collection Time: 06/01/23  9:58 PM   Specimen: Nasal Mucosa; Nasal Swab  Result Value Ref Range Status   MRSA by PCR Next Gen DETECTED (A) NOT DETECTED Final    Comment: RESULT CALLED TO, READ BACK BY AND VERIFIED WITH: G TATE RN 06/02/2023 @ 0032 BY AB (NOTE) The GeneXpert MRSA Assay (FDA approved for NASAL specimens only), is one component of a comprehensive MRSA colonization surveillance program. It is not intended to diagnose MRSA infection nor to guide or monitor treatment for MRSA infections. Test performance is not FDA approved in patients less than 55 years old. Performed at [facility], [address] [place], [place] [address]     Today   Subjective    Donney Caraveo today has no headache,no chest abdominal pain,no new weakness tingling or numbness, feels much better wants to go home today.     Objective   Blood pressure 118/85, pulse 67, temperature 98 F (36.7 C), temperature source Oral, resp. rate 18, height 5\' 9"  (1.753 m), weight 77.9 kg, SpO2 100%.   Intake/Output Summary (Last 24 hours) at 06/09/2023 7253 Last data filed at 06/09/2023 0541 Gross per 24 hour  Intake 120 ml  Output 825 ml  Net -705 ml     Exam  Awake, minimally confused which appears to be his baseline, chronic left-sided hemiparesis from previous stroke, No new F.N deficits,   Foley catheter in place. [place].AT,PERRAL Supple Neck,   Symmetrical Chest wall movement, Good air movement bilaterally, CTAB RRR,No Gallops,   +ve B.Sounds, Abd Soft, Non tender,  No Cyanosis, Clubbing or edema    Data Review   Recent Labs  Lab 06/03/23 0758 06/04/23 0504 06/05/23 0453  WBC 8.0 10.6* 10.1  HGB 8.6* 9.5* 9.8*  HCT 26.5* 30.1* 30.8*  PLT 140* 176 159  MCV 80.8 82.0 81.3  MCH 26.2 25.9* 25.9*  MCHC 32.5 31.6 31.8  RDW 14.7 14.9 15.0  LYMPHSABS 1.3 1.1 1.6  MONOABS 0.6 0.1 0.4  EOSABS 0.0 0.2 0.0  BASOSABS 0.0 0.0 0.1    Recent Labs  Lab 06/03/23 0758 06/04/23 0504 06/05/23 0453 06/06/23 1604 06/07/23 0933  NA 145 143 141 145 145  K 3.4* 4.4 3.5 3.7 3.8  CL 111 114* 108 110 111  CO2 28 23 28 26 28   ANIONGAP 6 6 5 9 6   GLUCOSE 100* 105* 83 95 84  BUN 42* 36* 30* 26* 24*  CREATININE 2.08* 1.85* 1.45* 1.27* 1.16  AST  --  54* 59* 38 40  ALT  --  31 41 32 33  ALKPHOS  --  68 69 61 58  BILITOT  --  0.8 0.5 0.4 0.5  ALBUMIN  --  2.2* 2.2* 2.0* 2.2*  CRP 14.2* 12.7* 9.9*  --   --   PROCALCITON 19.48 10.77 5.49  --   --   BNP 700.0* 483.5* 308.1*  --   --   MG 2.4 2.2 2.0 1.8 1.7  PHOS  --  2.7 2.9  --   --   CALCIUM 7.9* 7.9* 8.0* 8.2* 8.3*    Total Time in preparing paper work, data evaluation and todays exam - 35 minutes  Signature  -    Susa Raring M.D on 06/09/2023 at 8:08 AM   -  To page go to [email or web address]

## 2023-06-09 NOTE — Progress Notes (Signed)
 Attempted report to [facility], no answer. Will try again as able.

## 2023-06-09 NOTE — TOC Transition Note (Signed)
 Transition of Care [facility]) - Discharge Note   Patient Details  Name: Kurt Walton MRN: [id number] Date of Birth: 1950/06/07  [facility]) CM/SW Contact:  Erin Sons, LCSW Phone Number: 06/09/2023, 11:15 AM   Clinical Narrative:      Per MD patient ready for DC to [facility]. RN, patient, guardian, and facility notified of DC. Discharge Summary and FL2 sent to facility. RN to call report prior to discharge [phone]). DC packet on chart. [facility] approved EMS transport. Ambulance transport requested for patient.   CSW will sign off for now as social work intervention is no longer needed. Please consult [place] again if new needs arise.   Final next level of care: Skilled Nursing Facility Barriers to Discharge: No Barriers Identified   Patient Goals and CMS Choice            Discharge Placement              Patient chooses bed at: [facility] Patient to be transferred to facility by: [facility] Name of family member notified: Left message with Dianna Limbo Patient and family notified of of transfer: 06/09/23  Discharge Plan and Services Additional resources added to the After Visit Summary for   In-house Referral: Clinical Social Work Discharge Planning Services: CM Consult Post Acute Care Choice: Skilled Nursing Facility                               Social Drivers of Health (SDOH) Interventions SDOH Screenings   Food Insecurity: Patient Unable To Answer (06/03/2023)  Housing: Unknown (05/05/2023)  Transportation Needs: No Transportation Needs (05/05/2023)  Utilities: Not At Risk (05/05/2023)  Social Connections: Patient Unable To Answer (05/05/2023)  Tobacco Use: Medium Risk (06/01/2023)     Readmission Risk Interventions     No data to display

## 2023-06-10 ENCOUNTER — Other Ambulatory Visit: Payer: Self-pay | Admitting: Urology

## 2023-06-17 ENCOUNTER — Other Ambulatory Visit: Payer: Self-pay | Admitting: Urology

## 2023-06-18 MED ORDER — SODIUM CHLORIDE 0.9 % IV SOLN: 1.0000 g | Freq: Once | INTRAVENOUS | Status: AC

## 2023-06-22 ENCOUNTER — Encounter (HOSPITAL_COMMUNITY): Payer: Self-pay

## 2023-06-22 ENCOUNTER — Emergency Department (HOSPITAL_COMMUNITY): Payer: Medicare (Managed Care)

## 2023-06-22 ENCOUNTER — Emergency Department (HOSPITAL_COMMUNITY)
Admission: EM | Admit: 2023-06-22 | Discharge: 2023-06-22 | Disposition: A | Discharge status: Home / Self Care | Payer: Medicare (Managed Care) | Attending: Emergency Medicine | Admitting: Emergency Medicine

## 2023-06-22 ENCOUNTER — Other Ambulatory Visit: Payer: Self-pay

## 2023-06-22 DIAGNOSIS — Z7982 Long term (current) use of aspirin: Secondary | ICD-10-CM | POA: Insufficient documentation

## 2023-06-22 DIAGNOSIS — W19XXXA Unspecified fall, initial encounter: Secondary | ICD-10-CM

## 2023-06-22 DIAGNOSIS — I10 Essential (primary) hypertension: Secondary | ICD-10-CM | POA: Insufficient documentation

## 2023-06-22 DIAGNOSIS — Z79899 Other long term (current) drug therapy: Secondary | ICD-10-CM | POA: Diagnosis not present

## 2023-06-22 DIAGNOSIS — S01112A Laceration without foreign body of left eyelid and periocular area, initial encounter: Secondary | ICD-10-CM | POA: Insufficient documentation

## 2023-06-22 DIAGNOSIS — S0990XA Unspecified injury of head, initial encounter: Secondary | ICD-10-CM | POA: Diagnosis present

## 2023-06-22 MED ORDER — LIDOCAINE-EPINEPHRINE-TETRACAINE (LET) TOPICAL GEL
3.0000 mL | Freq: Once | TOPICAL | Status: AC
  Administered 2023-06-22: 3 mL via TOPICAL
  Filled 2023-06-22: qty 3

## 2023-06-22 NOTE — ED Triage Notes (Signed)
 [facility], fall this morning out of wheel chair, 2 in laceration on left eye brow, busted lip, at baseline mental status AOx2, speech is normal per facility staff, no LOC no thinners Hx hemiplegia on left  BP 110/76 P 84 RR 18 O2 97 RA 117 CBG

## 2023-06-22 NOTE — ED Provider Notes (Signed)
 I provided a substantive portion of the care of this patient.  I personally made/approved the management plan for this patient and take responsibility for the patient management.   Patient here after falling out of a wheelchair just prior to arrival.  CT of head and cervical spine without acute findings.  Laceration repaired.  Will discharge home   Lorre Nick, MD 06/22/23 [phone]

## 2023-06-22 NOTE — ED Provider Notes (Signed)
 [place] EMERGENCY DEPARTMENT AT [facility] Provider Note   CSN: [id number] Arrival date & time: 06/22/23  1124     History Chief Complaint  Patient presents with   Kurt Walton is a 73 y.o. male.  Patient with past history significant for hypertension, left hemiplegia, hydrocephalus presents emergency department today with concerns of a mechanical fall.  Patient reportedly fell out of his wheelchair and sustained a laceration to his left eyebrow.  Reportedly also cut into his lip from the fall.  Not on blood thinners but paperwork from his facility shows that he takes aspirin.  Patient is currently alert and oriented x 2 but at baseline.   Fall       Home Medications Prior to Admission medications   Medication Sig Start Date End Date Taking? Authorizing Provider  aspirin EC 81 MG tablet Take 81 mg by mouth in the morning. Swallow whole.    [provider]  Cholecalciferol 1.25 MG (50000 UT) TABS Take 50,000 Units by mouth every Friday.    [provider]  Nutritional Supplements (NUTRITIONAL DRINK PO) Take 120 mLs by mouth in the morning and at bedtime.    [provider]  pravastatin (PRAVACHOL) 80 MG tablet Take 80 mg by mouth at bedtime.    [provider]  senna (SENOKOT) 8.6 MG TABS tablet Take 1 tablet by mouth at bedtime.    [provider]  sulfamethoxazole-trimethoprim (BACTRIM DS) 800-160 MG tablet Take 1 tablet by mouth 2 (two) times daily.    [provider]  tamsulosin (FLOMAX) 0.4 MG CAPS capsule Take 0.4 mg by mouth in the morning.    [provider]  traZODone (DESYREL) 50 MG tablet Take 25 mg by mouth at bedtime.    [provider]  trimethoprim (TRIMPEX) 100 MG tablet Take 100 mg by mouth daily.    [provider]      Allergies    Tuberculin purified protein derivative and Tuberculin tests    Review of Systems   Review of Systems  Skin:  Positive  for wound.  All other systems reviewed and are negative.   Physical Exam Updated Vital Signs BP 108/70 (BP Location: Right Arm)   Pulse 78   Temp 99.1 F (37.3 C) (Oral)   Resp 18   Ht 5\' 9"  (1.753 m)   Wt 83.9 kg   SpO2 96%   BMI 27.32 kg/m  Physical Exam Vitals and nursing note reviewed.  Constitutional:      General: He is not in acute distress.    Appearance: He is well-developed.  HENT:     Head: Normocephalic and atraumatic.     Mouth/Throat:     Comments: Poor dentition, but no acute fractures of teeth as most appear to be oddly smoothed down Eyes:     Conjunctiva/sclera: Conjunctivae normal.  Cardiovascular:     Rate and Rhythm: Normal rate and regular rhythm.     Heart sounds: No murmur heard. Pulmonary:     Effort: Pulmonary effort is normal. No respiratory distress.     Breath sounds: Normal breath sounds.  Abdominal:     Palpations: Abdomen is soft.     Tenderness: There is no abdominal tenderness.  Musculoskeletal:        General: No swelling.     Cervical back: Neck supple.  Skin:    General: Skin is warm and dry.     Capillary Refill: Capillary refill takes  less than 2 seconds.     Findings: Lesion present.     Comments: 5cm lesions over the medial left eyebrow with extension towards the lateral eyebrow  Neurological:     Mental Status: He is alert.  Psychiatric:        Mood and Affect: Mood normal.     ED Results / Procedures / Treatments   Labs (all labs ordered are listed, but only abnormal results are displayed) Labs Reviewed - No data to display  EKG None  Radiology CT Head Wo Contrast Result Date: 06/22/2023 CLINICAL DATA:  Moderate to severe head trauma EXAM: CT HEAD WITHOUT CONTRAST CT CERVICAL SPINE WITHOUT CONTRAST TECHNIQUE: Multidetector CT imaging of the head and cervical spine was performed following the standard protocol without intravenous contrast. Multiplanar CT image reconstructions of the cervical spine were also  generated. RADIATION DOSE REDUCTION: This exam was performed according to the departmental dose-optimization program which includes automated exposure control, adjustment of the mA and/or kV according to patient size and/or use of iterative reconstruction technique. COMPARISON:  05/04/2023 FINDINGS: CT HEAD FINDINGS Brain: No evidence of acute infarction, hemorrhage, hydrocephalus, extra-axial collection or mass lesion/mass effect. Advanced atrophy in the posterior fossa with gliosis, suspect sequela of remote treatment. Right parietal VP shunt with unchanged ventriculomegaly. Shunt catheter spanning the ventricular system and upper canal with with chronic low-density scalloping near the torcula along the course of the catheter. Vascular: No hyperdense vessel or unexpected calcification. Skull: Normal. Negative for fracture or focal lesion. Sinuses/Orbits: No acute finding. CT CERVICAL SPINE FINDINGS Alignment: Reversal of cervical lordosis with C3-4 anterolisthesis. Skull base and vertebrae: No acute fracture. Generalized osteopenia. Soft tissues and spinal canal: No prevertebral fluid or swelling. No visible canal hematoma. Disc levels: Severe and widespread degenerative endplate and facet spurring. Diffuse degenerative disc collapse. Upper chest: No acute finding IMPRESSION: No evidence of acute intracranial or cervical spine injury. Electronically Signed   By: Tiburcio Pea M.D.   On: 06/22/2023 12:48   CT Cervical Spine Wo Contrast Result Date: 06/22/2023 CLINICAL DATA:  Moderate to severe head trauma EXAM: CT HEAD WITHOUT CONTRAST CT CERVICAL SPINE WITHOUT CONTRAST TECHNIQUE: Multidetector CT imaging of the head and cervical spine was performed following the standard protocol without intravenous contrast. Multiplanar CT image reconstructions of the cervical spine were also generated. RADIATION DOSE REDUCTION: This exam was performed according to the departmental dose-optimization program which includes  automated exposure control, adjustment of the mA and/or kV according to patient size and/or use of iterative reconstruction technique. COMPARISON:  05/04/2023 FINDINGS: CT HEAD FINDINGS Brain: No evidence of acute infarction, hemorrhage, hydrocephalus, extra-axial collection or mass lesion/mass effect. Advanced atrophy in the posterior fossa with gliosis, suspect sequela of remote treatment. Right parietal VP shunt with unchanged ventriculomegaly. Shunt catheter spanning the ventricular system and upper canal with with chronic low-density scalloping near the torcula along the course of the catheter. Vascular: No hyperdense vessel or unexpected calcification. Skull: Normal. Negative for fracture or focal lesion. Sinuses/Orbits: No acute finding. CT CERVICAL SPINE FINDINGS Alignment: Reversal of cervical lordosis with C3-4 anterolisthesis. Skull base and vertebrae: No acute fracture. Generalized osteopenia. Soft tissues and spinal canal: No prevertebral fluid or swelling. No visible canal hematoma. Disc levels: Severe and widespread degenerative endplate and facet spurring. Diffuse degenerative disc collapse. Upper chest: No acute finding IMPRESSION: No evidence of acute intracranial or cervical spine injury. Electronically Signed   By: Tiburcio Pea M.D.   On: 06/22/2023 12:48    Procedures .Laceration  Repair  Date/Time: 06/22/2023 3:22 PM  Performed by: Arnoldo Lenis Authorized by: Smitty Knudsen, PA-C   Consent:    Consent obtained:  Verbal   Consent given by:  Patient   Risks, benefits, and alternatives were discussed: yes     Risks discussed:  Infection, poor cosmetic result and pain Universal protocol:    Patient identity confirmed:  Verbally with patient and arm band Anesthesia:    Anesthesia method:  Topical application   Topical anesthetic:  LET Laceration details:    Location:  Face   Face location:  R eyebrow   Length (cm):  5   Depth (mm):  3 Exploration:     Hemostasis achieved with:  Direct pressure and LET   Imaging outcome: foreign body not noted     Contaminated: no   Treatment:    Area cleansed with:  Povidone-iodine and saline   Amount of cleaning:  Standard   Irrigation solution:  Sterile saline   Irrigation volume:  20cc   Irrigation method:  Syringe   Visualized foreign bodies/material removed: no     Debridement:  None   Undermining:  None   Scar revision: no   Skin repair:    Repair method:  Sutures   Suture size:  5-0   Suture material:  Prolene   Suture technique:  Simple interrupted   Number of sutures:  4 Approximation:    Approximation:  Close Repair type:    Repair type:  Simple Post-procedure details:    Dressing:  Open (no dressing)   Procedure completion:  Tolerated     Medications Ordered in ED Medications  lidocaine-EPINEPHrine-tetracaine (LET) topical gel (3 mLs Topical Given 06/22/23 1246)    ED Course/ Medical Decision Making/ A&P                                 Medical Decision Making Amount and/or Complexity of Data Reviewed Radiology: ordered.   This patient presents to the ED for concern of fall. Differential diagnosis includes an injury, stage, laceration, concussion   Imaging Studies ordered:  I ordered imaging studies including CT head, CT cervical spine I independently visualized and interpreted imaging which showed negative for any acute findings I agree with the radiologist interpretation   Medicines ordered and prescription drug management:  I ordered medication including LET gel for anesthetic Reevaluation of the patient after these medicines showed that the patient improved I have reviewed the patients home medicines and have made adjustments as needed   Problem List / ED Course:  Patient with past history significant for left-sided hemiplegia, hypertension, hydrocephalus who presents ED today with concerns of mechanical fall.  Patient reportedly fell out of his wheelchair  and struck his left eyebrow sustaining a laceration.  Not on blood thinners but medication list from care facility shows that he takes aspirin.  Patient reportedly at baseline with no alteration in speech or cognition.  Alert and oriented x 3. Physical exam is reassuring.  No areas of depressed skull and patient's pupils are equal and reactive.  A 5 cm laceration is noted to the left eyebrow not involving the eyelid.  Not actively bleeding.  No other focal area of pain or tenderness.  Given patient's mechanism and aspirin use, obtain CT imaging of the head and neck. CT imaging that neck are negative for any acute findings.  Will repair laceration with Prolene sutures. Laceration repair  performed by Nyoka Cowden, PA student under my direct supervision. Patient tolerated repair well. Advised need for suture removal in 7 days depending on wound healing. Patient otherwise stable and discharged home.   Social Determinants of Health:  Patient is a resident of a care facility  Final Clinical Impression(s) / ED Diagnoses Final diagnoses:  Fall, initial encounter  Laceration of left eyebrow, initial encounter    Rx / DC Orders ED Discharge Orders     None         Salomon Mast 06/22/23 1524    Lorre Nick, MD 06/24/23 1125

## 2023-06-22 NOTE — Discharge Instructions (Addendum)
 You were seen today for a fall and laceration to the eyebrow. Your imaging was thankfully negative for any abnormal findings. You had your eyebrow repaired with sutures which should remain in place for the next 7 days. Please have these checked in one week for possible removal. If any concerns of infection develop, seek medical evaluation.

## 2023-06-24 DIAGNOSIS — Z01818 Encounter for other preprocedural examination: Secondary | ICD-10-CM | POA: Diagnosis present

## 2023-06-24 DIAGNOSIS — I10 Essential (primary) hypertension: Secondary | ICD-10-CM | POA: Diagnosis not present

## 2023-06-24 NOTE — Patient Instructions (Signed)
 SURGICAL WAITING ROOM VISITATION  Patients having surgery or a procedure may have no more than 2 support people in the waiting area - these visitors may rotate.    Children under the age of 1 must have an adult with them who is not the patient.  Due to an increase in RSV and influenza rates and associated hospitalizations, children ages 31 and under may not visit patients in [facility] hospitals.  Visitors with respiratory illnesses are discouraged from visiting and should remain at home.  If the patient needs to stay at the hospital during part of their recovery, the visitor guidelines for inpatient rooms apply. Pre-op nurse will coordinate an appropriate time for 1 support person to accompany patient in pre-op.  This support person may not rotate.    Please refer to the [facility] website for the visitor guidelines for Inpatients (after your surgery is over and you are in a regular room).    Your procedure is scheduled on: 07/07/23   Report to [facility] Main Entrance    Report to admitting at 7:15 AM   Call this number if you have problems the morning of surgery [phone]   Do not eat food or drink liquids :After Midnight.          If you have questions, please contact your surgeon's office.   FOLLOW BOWEL PREP AND ANY ADDITIONAL PRE OP INSTRUCTIONS YOU RECEIVED FROM YOUR SURGEON'S OFFICE!!!     Oral Hygiene is also important to reduce your risk of infection.                                    Remember - BRUSH YOUR TEETH THE MORNING OF SURGERY WITH YOUR REGULAR TOOTHPASTE  DENTURES WILL BE REMOVED PRIOR TO SURGERY PLEASE DO NOT APPLY "Poly grip" OR ADHESIVES!!!   Do NOT smoke after Midnight   Stop all vitamins and herbal supplements 7 days before surgery.   Take these medicines the morning of surgery with A SIP OF WATER: Tamsulosin   DO NOT TAKE ANY ORAL DIABETIC MEDICATIONS DAY OF YOUR SURGERY  Bring CPAP mask and tubing day of surgery.                               You may not have any metal on your body including jewelry, and body piercing             Do not wear lotions, powders, cologne, or deodorant              Men may shave face and neck.   Do not bring valuables to the hospital. [facility] IS NOT             RESPONSIBLE   FOR VALUABLES.   Contacts, glasses, dentures or bridgework may not be worn into surgery.  DO NOT BRING YOUR HOME MEDICATIONS TO THE HOSPITAL. PHARMACY WILL DISPENSE MEDICATIONS LISTED ON YOUR MEDICATION LIST TO YOU DURING YOUR ADMISSION IN THE HOSPITAL!    Patients discharged on the day of surgery will not be allowed to drive home.  Someone NEEDS to stay with you for the first 24 hours after anesthesia.   Special Instructions: Bring a copy of your healthcare power of attorney and living will documents the day of surgery if you haven't scanned them before.  Please read over the following fact sheets you were given: IF YOU HAVE QUESTIONS ABOUT YOUR PRE-OP INSTRUCTIONS PLEASE CALL [phone] Contras    If you received a COVID test during your pre-op visit  it is requested that you wear a mask when out in public, stay away from anyone that may not be feeling well and notify your surgeon if you develop symptoms. If you test positive for Covid or have been in contact with anyone that has tested positive in the last 10 days please notify you surgeon.    [facility] - Preparing for Surgery Before surgery, you can play an important role.  Because skin is not sterile, your skin needs to be as free of germs as possible.  You can reduce the number of germs on your skin by washing with CHG (chlorahexidine gluconate) soap before surgery.  CHG is an antiseptic cleaner which kills germs and bonds with the skin to continue killing germs even after washing. Please DO NOT use if you have an allergy to CHG or antibacterial soaps.  If your skin becomes reddened/irritated stop using the CHG and inform your nurse when you  arrive at Short Stay. Do not shave (including legs and underarms) for at least 48 hours prior to the first CHG shower.  You may shave your face/neck.  Please follow these instructions carefully:  1.  Shower with CHG Soap the night before surgery and the  morning of surgery.  2.  If you choose to wash your hair, wash your hair first as usual with your normal  shampoo.  3.  After you shampoo, rinse your hair and body thoroughly to remove the shampoo.                             4.  Use CHG as you would any other liquid soap.  You can apply chg directly to the skin and wash.  Gently with a scrungie or clean washcloth.  5.  Apply the CHG Soap to your body ONLY FROM THE NECK DOWN.   Do   not use on face/ open                           Wound or open sores. Avoid contact with eyes, ears mouth and   genitals (private parts).                       Wash face,  Genitals (private parts) with your normal soap.             6.  Wash thoroughly, paying special attention to the area where your    surgery  will be performed.  7.  Thoroughly rinse your body with warm water from the neck down.  8.  DO NOT shower/wash with your normal soap after using and rinsing off the CHG Soap.                9.  Pat yourself dry with a clean towel.            10.  Wear clean pajamas.            11.  Place clean sheets on your bed the night of your first shower and do not  sleep with pets. Day of Surgery : Do not apply any lotions/deodorants the morning of surgery.  Please wear clean clothes to the hospital/surgery  center.  FAILURE TO FOLLOW THESE INSTRUCTIONS MAY RESULT IN THE CANCELLATION OF YOUR SURGERY  PATIENT SIGNATURE_________________________________  NURSE SIGNATURE__________________________________  ________________________________________________________________________

## 2023-06-24 NOTE — Progress Notes (Signed)
 Patient lives in [facility]. He came to PST today with PACE worker that does not know any of his medical history. He did bring paper work from facility that is on chart. Called facility to inform that soap and instructions were sent back with patient and to call back with any questions  Patient is alert and oriented to self and place. He has a legal guardian, is not able to sign own consent forms. Was not able to get a hold of Legal guardian, Paris Lore, today. Will try again tomorrow.  Left voicemail for guardian 06/26/23.  COVID Vaccine Completed:  Date of COVID positive in last 90 days:  PCP - Marny Lowenstein, MD Cardiologist -   Chest x-ray - 06/01/23 Epic EKG - 06/01/23 Epic Stress Test -  ECHO - 05/05/20 Epic Cardiac Cath -  Pacemaker/ICD device last checked: Spinal Cord Stimulator:  Bowel Prep -   Sleep Study -  CPAP -   Fasting Blood Sugar -  Checks Blood Sugar _____ times a day  Last dose of GLP1 agonist-  N/A GLP1 instructions:  Hold 7 days before surgery    Last dose of SGLT-2 inhibitors-  N/A SGLT-2 instructions:  Hold 3 days before surgery    Blood Thinner Instructions:  Last dose:   Time: Aspirin Instructions: ASA 81 Last Dose:  Activity level: Patient comes in today in Wheel chair. Per facility notes he is wheelchair dependent   Anesthesia review: sepsis 06/01/23, HTN , cerebral aneurysm with VP shunt, Hgb 9.1  Patient denies shortness of breath, fever, cough and chest pain at PAT appointment  PAT instructions sent back to facility with patient. Called facility to inform to be expecting instructions and soap.

## 2023-06-25 ENCOUNTER — Encounter (HOSPITAL_COMMUNITY)
Admission: RE | Admit: 2023-06-25 | Discharge: 2023-06-25 | Disposition: A | Discharge status: Home / Self Care | Payer: Medicare (Managed Care) | Source: Ambulatory Visit | Attending: Urology | Admitting: Urology

## 2023-06-25 VITALS — BP 111/69 | HR 81 | Temp 97.9°F | Resp 16 | Ht 69.0 in

## 2023-06-25 DIAGNOSIS — Z01818 Encounter for other preprocedural examination: Secondary | ICD-10-CM | POA: Insufficient documentation

## 2023-06-25 DIAGNOSIS — I10 Essential (primary) hypertension: Secondary | ICD-10-CM | POA: Insufficient documentation

## 2023-06-25 HISTORY — DX: Pure hypercholesterolemia, unspecified: E78.00

## 2023-06-25 HISTORY — DX: Venous insufficiency (chronic) (peripheral): I87.2

## 2023-06-25 HISTORY — DX: Vascular dementia, unspecified severity, without behavioral disturbance, psychotic disturbance, mood disturbance, and anxiety: F01.50

## 2023-06-25 HISTORY — DX: Dyspnea, unspecified: R06.00

## 2023-06-25 HISTORY — DX: Vitamin D deficiency, unspecified: E55.9

## 2023-06-25 HISTORY — DX: Hemiplegia, unspecified affecting unspecified side: G81.90

## 2023-06-25 HISTORY — DX: Unspecified hydronephrosis: N13.30

## 2023-06-25 LAB — BASIC METABOLIC PANEL WITH GFR
Anion gap: 9 (ref 5–15)
BUN: 19 mg/dL (ref 8–23)
CO2: 24 mmol/L (ref 22–32)
Calcium: 9 mg/dL (ref 8.9–10.3)
Chloride: 104 mmol/L (ref 98–111)
Creatinine, Ser: 1.38 mg/dL — ABNORMAL HIGH (ref 0.61–1.24)
GFR, Estimated: 54 mL/min — ABNORMAL LOW (ref >60)
Glucose, Bld: 100 mg/dL — ABNORMAL HIGH (ref 70–99)
Potassium: 4.1 mmol/L (ref 3.5–5.1)
Sodium: 137 mmol/L (ref 135–145)

## 2023-06-25 LAB — CBC
HCT: 30.2 % — ABNORMAL LOW (ref 39.0–52.0)
Hemoglobin: 9.1 g/dL — ABNORMAL LOW (ref 13.0–17.0)
MCH: 25 pg — ABNORMAL LOW (ref 26.0–34.0)
MCHC: 30.1 g/dL (ref 30.0–36.0)
MCV: 83 fL (ref 80.0–100.0)
Platelets: 124 10*3/uL — ABNORMAL LOW (ref 150–400)
RBC: 3.64 MIL/uL — ABNORMAL LOW (ref 4.22–5.81)
RDW: 15.7 % — ABNORMAL HIGH (ref 11.5–15.5)
WBC: 9 10*3/uL (ref 4.0–10.5)
nRBC: 0 % (ref 0.0–0.2)

## 2023-06-26 ENCOUNTER — Encounter (HOSPITAL_COMMUNITY): Payer: Self-pay

## 2023-06-30 NOTE — Progress Notes (Signed)
 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  Preop labs reviewed, creatinine mildly elevated at 1.38 consistent with history of CKD, chronic anemia hemoglobin 9.1, mild thrombocytopenia platelets 124, otherwise unremarkable.  EKG 06/01/2023: Sinus rhythm. Rate 95. Borderline short PR interval. Borderline T abnormalities, anterior leads  TTE 05/05/2020:  1. Left ventricular ejection fraction, by estimation, is 60 to 65%. The  left ventricle has normal function. The left ventricle has no regional  wall motion abnormalities. Left ventricular diastolic parameters were  normal.   2. Right ventricular systolic function is normal. The right ventricular  size is normal. Tricuspid regurgitation signal is inadequate for assessing  PA pressure.   3. The mitral valve is normal in structure. Trivial mitral valve  regurgitation. No evidence of mitral stenosis.   4. The aortic valve is normal in structure. Aortic valve regurgitation is  not visualized. No aortic stenosis is present.    [place], [facility] Short Stay Center/Anesthesiology Phone [phone] 06/30/2023 1:40 PM

## 2023-06-30 NOTE — Anesthesia Preprocedure Evaluation (Signed)
 Anesthesia Evaluation    Airway        Dental   Pulmonary former smoker          Cardiovascular hypertension,      Neuro/Psych    GI/Hepatic   Endo/Other    Renal/GU      Musculoskeletal   Abdominal   Peds  Hematology   Anesthesia Other Findings   Reproductive/Obstetrics                              Anesthesia Physical Anesthesia Plan  ASA:   Anesthesia Plan:    Post-op Pain Management:    Induction:   PONV Risk Score and Plan:   Airway Management Planned:   Additional Equipment:   Intra-op Plan:   Post-operative Plan:   Informed Consent:   Plan Discussed with:   Anesthesia Plan Comments: (PAT note by Antionette Poles, PA-C: 73 year old male who resides at [facility] with pertinent medical history including HTN, CKD 3B, 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  Preop labs reviewed, creatinine mildly elevated at 1.38 consistent with history of CKD, chronic anemia hemoglobin 9.1, mild thrombocytopenia platelets 124, otherwise unremarkable.  EKG 06/01/2023: Sinus rhythm. Rate 95. Borderline short PR interval. Borderline T abnormalities, anterior leads  TTE 05/05/2020:  1. Left ventricular ejection fraction, by estimation, is 60 to 65%. The  left ventricle has normal function. The left ventricle has no regional  wall motion abnormalities. Left ventricular diastolic parameters were  normal.   2. Right ventricular systolic function is normal. The right ventricular  size is  normal. Tricuspid regurgitation signal is inadequate for assessing  PA pressure.   3. The mitral valve is normal in structure. Trivial mitral valve  regurgitation. No evidence of mitral stenosis.   4. The aortic valve is normal in structure. Aortic valve regurgitation is  not visualized. No aortic stenosis is present.    )         Anesthesia Quick Evaluation

## 2023-07-07 ENCOUNTER — Encounter (HOSPITAL_COMMUNITY)
Admission: RE | Disposition: A | Discharge status: Skilled Nursing Facility | Payer: Self-pay | Source: Ambulatory Visit | Attending: Urology

## 2023-07-07 ENCOUNTER — Ambulatory Visit (HOSPITAL_COMMUNITY): Payer: Medicare (Managed Care)

## 2023-07-07 ENCOUNTER — Ambulatory Visit (HOSPITAL_COMMUNITY): Payer: Medicare (Managed Care) | Admitting: Physician Assistant

## 2023-07-07 ENCOUNTER — Ambulatory Visit (HOSPITAL_COMMUNITY)
Admission: RE | Admit: 2023-07-07 | Discharge: 2023-07-07 | Disposition: A | Discharge status: Skilled Nursing Facility | Payer: Medicare (Managed Care) | Source: Ambulatory Visit | Attending: Urology | Admitting: Urology

## 2023-07-07 ENCOUNTER — Encounter (HOSPITAL_COMMUNITY): Payer: Self-pay | Admitting: Urology

## 2023-07-07 DIAGNOSIS — I69354 Hemiplegia and hemiparesis following cerebral infarction affecting left non-dominant side: Secondary | ICD-10-CM | POA: Insufficient documentation

## 2023-07-07 DIAGNOSIS — Z87891 Personal history of nicotine dependence: Secondary | ICD-10-CM | POA: Insufficient documentation

## 2023-07-07 DIAGNOSIS — I10 Essential (primary) hypertension: Secondary | ICD-10-CM | POA: Insufficient documentation

## 2023-07-07 DIAGNOSIS — W06XXXA Fall from bed, initial encounter: Secondary | ICD-10-CM | POA: Insufficient documentation

## 2023-07-07 DIAGNOSIS — Z539 Procedure and treatment not carried out, unspecified reason: Secondary | ICD-10-CM | POA: Insufficient documentation

## 2023-07-07 DIAGNOSIS — F0390 Unspecified dementia without behavioral disturbance: Secondary | ICD-10-CM | POA: Diagnosis not present

## 2023-07-07 DIAGNOSIS — Z79899 Other long term (current) drug therapy: Secondary | ICD-10-CM | POA: Diagnosis not present

## 2023-07-07 DIAGNOSIS — N471 Phimosis: Secondary | ICD-10-CM | POA: Diagnosis present

## 2023-07-07 SURGERY — CIRCUMCISION, ADULT
Anesthesia: Choice

## 2023-07-07 MED ORDER — LIDOCAINE HCL (PF) 2 % IJ SOLN
INTRAMUSCULAR | Status: AC
  Filled 2023-07-07: qty 5

## 2023-07-07 MED ORDER — PROPOFOL 10 MG/ML IV BOLUS
INTRAVENOUS | Status: AC
  Filled 2023-07-07: qty 20

## 2023-07-07 MED ORDER — LACTATED RINGERS IV SOLN: INTRAVENOUS | Status: DC

## 2023-07-07 MED ORDER — ORAL CARE MOUTH RINSE: 15.0000 mL | Freq: Once | OROMUCOSAL | Status: DC

## 2023-07-07 MED ORDER — CHLORHEXIDINE GLUCONATE 0.12 % MT SOLN: 15.0000 mL | Freq: Once | OROMUCOSAL | Status: DC

## 2023-07-07 NOTE — Progress Notes (Signed)
 Patient surgery is cancelled today because we were unable to get in contact with the patients legal guardian, multiple attempts were made.

## 2023-07-07 NOTE — Progress Notes (Signed)
 Attempted to reach patients legal guardian Paris Lore via cell phone but phone goes straight to voicemail and the mailbox is full.  The office number listed is no longer in use.  Attempted to reach the patients daughter Bea Laura,  Patients daughter answered the phone and we discussed the patients legal guardian status.  [name] stated that in the past they have had multiple issues with getting in contact with the patients legal guardian Paris Lore, and that in the past they have ultimately called her for consent.  She stated that the reason she was no longer the guardian was because she had developed her own medical issues and could no longer care for her father.  I verified that the number we had was the same number she had for the legal guardian.    I spoke with Dr Alvester Morin who stated that he had attempted to reach the legal guardian multiple times and was not successful in getting in touch with her either.  Short stay staff called multiple times and were not able to get in touch with her.     I spoke with our Social Work team here and the suggestion was that since it seems that the legal guardian has been difficult to get in touch with for a while then it was in the patients best interest that it be reported to the Adult Protective Services.  They were called,  I had to leave a message.  A Edsel Petrin returned my call who then stated I needed to speak to a Rodena Medin.  She transferred me to his voicemail where I again left a message.  It said it may take up to 48 hours for a call back.  And left an alternative number incase a call back was not made within 48 hours.     I then called Dr Alvester Morin to let him know that it would be impossible to get consent for this patient to have surgery to day, he understood and the surgery today was cancelled.  We called transportation services to come pick up the patient.  I then called over to [facility] to speak with the patients nurse.  I spoke with  G[place] at 1005 to let her know that the patients was not able to have his surgery today due to not being able to get in touch with the legal guardian for consent.  I then asked has she been able to speak with the legal guardian in the past.  Grenada stated that she has spoken to the legal guardian before when the patient had a fall more recently.  She said that did let Paris Lore know that the patient was having a upcoming surgery at that time of the call for the fall.  But Grenada stated she has not heard from the legal guardian Marylene Land since that phone call.    After speaking with [place] I asked to be transferred to social work at [facility] [employer].  I spoke to Lamar who stated she was not Mr. Gong's normal Child psychotherapist but she would take a message.  I told her that APS was called about the legal guardian situation and that it was felt that this has been an ongoing issue that needed to be looked into.  She understood and took my message.  I told her that [facility] will need to continue to follow up with the APS department in regard to the legal guardian.  I explained that I have left messaged for  them but the voicemail said it can take up to 48 hours for a return call.  If there is no return call in 48 hours then this is the number that needs to be called [phone].

## 2023-07-07 NOTE — Progress Notes (Signed)
 Patient arrived from [facility] oriented to person only.  Patient unable to state why he is here.  RN attempted to contact Legal Guardian Kristian Petty via cell phone [phone] which went directly to voicemail.  Unable to leave a message as the voicemail was full.  Attempted office number [phone] which "is no longer in use".  Rn then left voicemail for patients daugther Kary Pages @ [phone] as well as attempted to call son in law Lonzie Robins [phone] which is not a working number.  Dr. Parke Boll notified.

## 2023-07-07 NOTE — H&P (Signed)
 CC/HPI: 05/30/2023: New patient to clinic. He is a 73yo male with history of dementia, hypertension, previous history of alcohol use/polysubstance abuse, CVA with left residual hemiparesis who was brought to the ED on 2/9 from [facility] SNF/rehab after he fell trying to get into bed from his wheelchair. There was no LOC. Workup including CT imaging showed massive bladder distention with significant bilateral hydroureteronephrosis as well as severe phimosis. He was not able to void and there is also red-tinged urine coming from his penis. Urology consulted. Dr. Dalbert Mayotte ultimately placed a sensor wire via the urethral meatus all the way into the bladder, a council tip catheter was then placed over the guidewire and into the bladder where copious amounts of urine was drained. Initial creatinine was 3.88 but by time of hospital discharge on 2/12 it had improved to 1.12 with GFR greater than 60. Urine culture assessed positive for procidentia. Initially treated with ceftriaxone and then transition to Augmentin for couple more days at time of discharge back to his SNF.   Patient is a poor historian given past history of dementia. He is oriented to self and place but cannot provide any accurate history regarding HPI. He is only accompanied by a transport aide today. History obtained via documentation received from his facility including MAR as well as past notes in Epic.   06/16/2023: The patient's catheter was exchanged over guidewire at last exam. Culture obtained at that time grew ESBL E. coli. He was admitted 2 days later with symptoms of sepsis. He initially required pressors to maintain blood pressure. He was rehydrated with IV fluids and received IV meropenem for total of 7 days before discharge back to his facility. Returns today for follow-up evaluation. Appears that he had a catheter exchanged in the emergency department upon initial presentation, also started on tamsulosin. He is currently scheduled for  outpatient circumcision with Dr. Alvester Morin on 14 April.   He presents with transport aide today. His catheter is not draining on my exam. He is only oriented to self which is consistent with prior baseline.     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 History of falling Hypercholesterolemia Hypertension Obstructive hydrocephalus Restlessness and agitation Vascular dementia, unspecified severity, with other behavioral disturbance Venous insufficiency (chronic) (peripheral) Vitamin D deficiency, unspecified    FAMILY HISTORY: No Family History    SOCIAL HISTORY: Marital Status: Divorced Preferred Language: English; Ethnicity: Not Hispanic Or Latino; Race: Black or African American    REVIEW OF SYSTEMS:    GU Review Male:   Patient denies frequent urination, hard to postpone urination, burning/ pain with urination, get up at night to urinate, leakage of urine, stream starts and stops, trouble starting your stream, have to strain to urinate , erection problems, and penile pain.  Gastrointestinal (Upper):   Patient denies nausea, vomiting, and indigestion/ heartburn.  Gastrointestinal (Lower):   Patient denies diarrhea and constipation.  Constitutional:  Patient denies fever, night sweats, weight loss, and fatigue.  Skin:   Patient denies skin rash/ lesion and itching.  Eyes:    Patient denies blurred vision and double vision.  Ears/ Nose/ Throat:   Patient denies sore throat and sinus problems.  Hematologic/Lymphatic:   Patient denies swollen glands and easy bruising.  Cardiovascular:   Patient denies chest pains and leg swelling.  Respiratory:   Patient denies cough and shortness of breath.  Endocrine:   Patient denies excessive thirst.  Musculoskeletal:   Patient denies back pain and joint pain.  Neurological:   Patient denies headaches and dizziness.  Psychologic:   Patient denies depression and anxiety.   VITAL SIGNS: None   GU PHYSICAL EXAMINATION:    Penis: Penis uncircumcised, severe phimosis with Penile foley catheter present with no urine output noted in collection bag. The catheter was routed up and over his clothing with bag in his wheelchair.   MULTI-SYSTEM PHYSICAL EXAMINATION:    Constitutional: Well-nourished. No physical deformities. Normally developed. Good grooming. Patient in wheelchair, he has left-sided deficits from prior CVA. He requires a two-person assist with transition from wheelchair to table.  Neck: Neck symmetrical, not swollen. Normal tracheal position.  Respiratory: No labored breathing, no use of accessory muscles.   Cardiovascular: Normal temperature, normal extremity pulses, no swelling, no varicosities.  Skin: No paleness, no jaundice, no cyanosis. No lesion, no ulcer, no rash.  Neurologic / Psychiatric: Not oriented to time, place. Oriented to person. No depression, no anxiety, no agitation.   Gastrointestinal: No mass, no tenderness, no rigidity, non obese abdomen.  Musculoskeletal: Normal gait and station of head and neck.     Complexity of Data:  Source Of History:  Patient, Family/Caregiver, Medical Record Summary  Lab Test Review:   BMP, CBC with Diff, CMP  Records Review:   Previous Doctor Records, Previous Hospital Records, Previous Patient Records  Urine Test Review:   Urine Culture  X-Ray Review: C.T.  Abdomen/Pelvis: Reviewed Films. Reviewed Report.    Notes:                     EXAM:  CT ABDOMEN AND PELVIS WITHOUT CONTRAST   TECHNIQUE:  Multidetector CT imaging of the abdomen and pelvis was performed  following the standard protocol without IV contrast.   RADIATION DOSE REDUCTION: This exam was performed according to the  departmental dose-optimization program which includes automated  exposure control, adjustment of the mA and/or kV according to  patient size and/or use of iterative reconstruction technique.   COMPARISON: 05/04/2023   FINDINGS:  Lower chest: No acute abnormality.   Hepatobiliary: Scattered hepatic hypodensities are again seen  throughout the liver likely representing small attic cysts. No solid  intrahepatic mass is clearly identified on this noncontrast  examination. No intra or extrahepatic biliary ductal dilation.  Gallbladder is decompressed and is unremarkable.   Pancreas: Unremarkable   Spleen: Unremarkable   Adrenals/Urinary Tract: Foley catheter balloon is now seen within  the bladder lumen and the bladder is decompressed. Previously noted  bilateral hydroureteronephrosis has resolved. The bladder wall is  diffusely thickened in keeping with changes chronic bladder outlet  obstruction. The kidneys are unremarkable in this noncontrast  examination. The adrenal glands are unremarkable.   Stomach/Bowel: The distal colon and rectal vault to fluid-filled  keeping with a diarrheal state. The stomach, small bowel, and large  are of all is unremarkable. Ventriculoperitoneal shunt catheter  tubing is seen looped with pelvis with its  tip within the right  lower quadrant. No free intraperitoneal gas or fluid. Appendix  normal.   Vascular/Lymphatic: Aortic atherosclerosis. No enlarged abdominal or  pelvic lymph nodes.   Reproductive: Moderate prostatic hypertrophy again noted with  estimated prostatic volume of 70-80 cc.   Other: No abdominal wall  hernia or abnormality. No abdominopelvic  ascites.   Musculoskeletal: Right femoral nail partially visualized. Moderate  lumbar dextroscoliosis is present superimposed degenerative changes  throughout the lumbar spine. Superimposed anterior wedge compression  deformity of L1 is present with 50-60% loss of height anteriorly and  no retropulsion, chronic in nature. No acute bone abnormality. No  lytic or blastic bone lesion.   IMPRESSION:  1. Fluid-filled distal colon and rectal vault in keeping with a  diarrheal state.  2. Interval placement of Foley catheter with decompression of the  bladder. Previously noted bilateral hydroureteronephrosis has  resolved.  3. Moderate prostatic hypertrophy with changes of chronic bladder  outlet obstruction.   Aortic Atherosclerosis (ICD10-I70.0).    Electronically Signed  By: Worthy Heads M.D.  On: 06/01/2023 19:32     PROCEDURES:          Visit Complexity - G2211         Notes:   His catheter was not noted to be draining. I tried irrigating it to no effect. I deflated the catheter balloon and inserted the catheter more into the bladder before reinflating the balloon. This resulted in brisk drainage of purulent, malodorous urine at approximately 1500 cc. The catheter irrigated well following this. A new bedside bag was attached and properly routed out of his diaper in a dependent fashion to ensure good drainage going forward.   ASSESSMENT:      ICD-10 Details  1 GU:   Urinary Retention - R33.8 Chronic, Stable  2   Phimosis - N47.1 Chronic, Threat to Bodily Function  3   Hydronephrosis - N13.0 Acute, Complicated Injury, Resolved  4   Acute Cystitis/UTI - N30.00 Acute, Threat to Bodily Function, Improving   PLAN:            Medications New Meds: Bactrim DS 800-160 MG Tablet 1 tablet PO BID   #14  0 Refill(s)  Trimethoprim 100 MG Tablet 1 tablet PO Daily Begin following completion of Bactrim  #30  0 Refill(s)  Pharmacy Name:  [facility]  Address:  [address]   [place], [place] [address]  Phone:  [phone]  Fax:  [phone]            Orders Labs Urine Culture CATH          Schedule Return Visit/Planned Activity: Keep Scheduled Appointment - Follow up MD, Schedule Surgery          Document Letter(s):  Created for Patient: Clinical Summary         Notes:   Given the findings of how his catheter was positioned and draining today requiring deflating of the balloon and inserting it better into the bladder before achieving approximately 1500 cc of cloudy and malodorous urine return, my concern for severe infection recurrence are quite high. I wrote in my orders back to his facility for better diligence in regards to monitoring catheter output as well as taking care to ensure it is properly positioned to allow for optimal drainage. I am going to go ahead and empirically treat him for an infection based on sensitivity of last available culture data. He will take Bactrim twice daily for a  week then trimethoprim 100 mg daily after that for bacterial prophylaxis. I will send a message to Dr. Parke Boll about changing his peri-operative abx coverage as last culture was resistant to what is currently ordered. For now he will keep his scheduled follow-up for circumcision on 4/14.        Next Appointment:      Next Appointment: 07/07/2023 09:30 AM    Appointment Type: Surgery     Location: [facility] [phone]    Provider: Leila Punt, III, M.D.    Reason for Visit: OP--[facility]--CIRCUMCISION     Signed by Williemae Harsh, NP on 06/16/23 at 1:21 PM (EDT

## 2023-07-12 ENCOUNTER — Encounter (HOSPITAL_COMMUNITY): Payer: Self-pay

## 2023-07-12 ENCOUNTER — Other Ambulatory Visit: Payer: Self-pay

## 2023-07-12 ENCOUNTER — Emergency Department (HOSPITAL_COMMUNITY)
Admission: EM | Admit: 2023-07-12 | Discharge: 2023-07-12 | Disposition: A | Discharge status: Skilled Nursing Facility | Payer: Medicare (Managed Care) | Attending: Emergency Medicine | Admitting: Emergency Medicine

## 2023-07-12 DIAGNOSIS — R296 Repeated falls: Secondary | ICD-10-CM | POA: Diagnosis not present

## 2023-07-12 DIAGNOSIS — F0390 Unspecified dementia without behavioral disturbance: Secondary | ICD-10-CM | POA: Diagnosis not present

## 2023-07-12 DIAGNOSIS — Z7982 Long term (current) use of aspirin: Secondary | ICD-10-CM | POA: Diagnosis not present

## 2023-07-12 DIAGNOSIS — Z043 Encounter for examination and observation following other accident: Secondary | ICD-10-CM | POA: Insufficient documentation

## 2023-07-12 DIAGNOSIS — W19XXXA Unspecified fall, initial encounter: Secondary | ICD-10-CM

## 2023-07-12 DIAGNOSIS — Z8673 Personal history of transient ischemic attack (TIA), and cerebral infarction without residual deficits: Secondary | ICD-10-CM | POA: Diagnosis not present

## 2023-07-12 LAB — CBC
HCT: 31.7 % — ABNORMAL LOW (ref 39.0–52.0)
Hemoglobin: 9.7 g/dL — ABNORMAL LOW (ref 13.0–17.0)
MCH: 25.9 pg — ABNORMAL LOW (ref 26.0–34.0)
MCHC: 30.6 g/dL (ref 30.0–36.0)
MCV: 84.8 fL (ref 80.0–100.0)
Platelets: 240 10*3/uL (ref 150–400)
RBC: 3.74 MIL/uL — ABNORMAL LOW (ref 4.22–5.81)
RDW: 17.6 % — ABNORMAL HIGH (ref 11.5–15.5)
WBC: 6.2 10*3/uL (ref 4.0–10.5)
nRBC: 0 % (ref 0.0–0.2)

## 2023-07-12 LAB — BASIC METABOLIC PANEL WITH GFR
Anion gap: 6 (ref 5–15)
BUN: 18 mg/dL (ref 8–23)
CO2: 28 mmol/L (ref 22–32)
Calcium: 9 mg/dL (ref 8.9–10.3)
Chloride: 106 mmol/L (ref 98–111)
Creatinine, Ser: 1.16 mg/dL (ref 0.61–1.24)
GFR, Estimated: >60 mL/min (ref >60)
Glucose, Bld: 92 mg/dL (ref 70–99)
Potassium: 4 mmol/L (ref 3.5–5.1)
Sodium: 140 mmol/L (ref 135–145)

## 2023-07-12 NOTE — ED Notes (Signed)
 This RN went to attempt testing patient's gait per order. Patient told this RN that he does not walk. Provider notified.

## 2023-07-12 NOTE — ED Provider Notes (Signed)
  EMERGENCY DEPARTMENT AT [facility] Provider Note   CSN: [id number] Arrival date & time: 07/12/23  1648     History  No chief complaint on file.   Kurt Walton is a 73 y.o. male.  HPI Patient sent from [facility].  Patient does not have any complaints.  His report is that he called out a number of times to go to the bathroom but it is inconsistent as to whether or not he will get help.  Patient reports he got up to go to the bathroom on his own and by the time he came out they had EMS there to pick him up.  He denies he sustained any injuries or has any specific problems.   The EMS run not as follows: 73 year old man in the bathroom at facility.  Staff stated patient had been acting out, and has either fallen out of his wheelchair or "thrown himself out of his wheelchair".  Patient has history of dementia and staff states he forgets that he has a catheter and tries to go to the bathroom on his own without assistance and gets frustrated when they tell him he is unable to get up on his own.  According to staff this is patient's normal baseline.  Staff stated he "fell" out yesterday and was not sent out for evaluation but after today's "fall", physician and family wanted patient to be further assessed.  Staff stated no LOC and no blood thinners.  Patient has no complaints, EMS did not find any bruising, deformities or pain upon palpation of neck back or extremities.  Patient was assisted to the stretcher and secured with all straps.  Patient remained unchanged during transport to [facility].  Upon arrival patient was taken to hallway bed 8 care was transferred to RN.    Home Medications Prior to Admission medications   Medication Sig Start Date End Date Taking? Authorizing Provider  aspirin  EC 81 MG tablet Take 81 mg by mouth in the morning. Swallow whole.    [provider]  Cholecalciferol 1.25 MG (50000 UT) TABS Take 50,000 Units by mouth every  Friday.    [provider]  Nutritional Supplements (NUTRITIONAL DRINK PO) Take 120 mLs by mouth in the morning and at bedtime.    [provider]  pravastatin  (PRAVACHOL ) 80 MG tablet Take 80 mg by mouth at bedtime.    [provider]  senna (SENOKOT) 8.6 MG TABS tablet Take 1 tablet by mouth at bedtime.    [provider]  sulfamethoxazole-trimethoprim (BACTRIM DS) 800-160 MG tablet Take 1 tablet by mouth 2 (two) times daily.    [provider]  tamsulosin  (FLOMAX ) 0.4 MG CAPS capsule Take 0.4 mg by mouth in the morning.    [provider]  traZODone  (DESYREL ) 50 MG tablet Take 25 mg by mouth at bedtime.    [provider]  trimethoprim (TRIMPEX) 100 MG tablet Take 100 mg by mouth daily.    [provider]      Allergies    Tuberculin purified protein derivative and Tuberculin tests    Review of Systems   Review of Systems  Physical Exam Updated Vital Signs BP 113/69 (BP Location: Right Arm)   Pulse 68   Temp (!) 97.5 F (36.4 C) (Oral)   Resp 16   Ht 5\' 9"  (1.753 m)   Wt 84 kg   SpO2 100%   BMI 27.35 kg/m  Physical Exam Constitutional:  Comments: Patient is alert.  No acute distress.  No respiratory distress.  HENT:     Head: Normocephalic and atraumatic.     Mouth/Throat:     Pharynx: Oropharynx is clear.  Eyes:     Extraocular Movements: Extraocular movements intact.  Cardiovascular:     Rate and Rhythm: Normal rate and regular rhythm.  Pulmonary:     Effort: Pulmonary effort is normal.     Breath sounds: Normal breath sounds.  Chest:     Chest wall: No tenderness.  Abdominal:     General: There is no distension.     Palpations: Abdomen is soft.     Tenderness: There is no abdominal tenderness. There is no guarding.  Genitourinary:    Comments: Foley catheter in the penis.  No drainage no discharge or blood. Musculoskeletal:     Comments: No evidence of deformity or trauma to the  extremities.  Patient does have some contracture to the left upper extremity.  He has some muscular atrophy and wasting.  I can put patients extremities through range of motion without pain.  He can assist to sit up almost independently and does not complain of any back or neck pain.  I have put both lower extremities through flexion extension and patient does not have pain.  He can push against resistance.  Skin:    General: Skin is warm and dry.  Neurological:     Comments: Patient is alert.  Has noted history of dementia and CVA.  Patient's understanding of the events surrounding his transport to the emergency department seem to relatively correlate to EMS notes.  He is aware that he is had a stroke and has some physical limitations.  He also described a prior extremity surgery.  Patient assist in following commands.  As I put his lower extremities through flexion extension maneuvers he will push against resistance bilaterally.     ED Results / Procedures / Treatments   Labs (all labs ordered are listed, but only abnormal results are displayed) Labs Reviewed  CBC - Abnormal; Notable for the following components:      Result Value   RBC 3.74 (*)    Hemoglobin 9.7 (*)    HCT 31.7 (*)    MCH 25.9 (*)    RDW 17.6 (*)    All other components within normal limits  BASIC METABOLIC PANEL WITH GFR    EKG None  Radiology No results found.  Procedures Procedures    Medications Ordered in ED Medications - No data to display  ED Course/ Medical Decision Making/ A&P                                 Medical Decision Making Amount and/or Complexity of Data Reviewed Labs: ordered.   Patient presents as outlined.  He is sent for concern of fall or difficulty following instruction for mobility and assistance.  There is not clearly described fall from today.  Physical exam does not suggest any areas of injury.  At this time I do not think he needs any imaging.  Given patient's baseline  state of disability and possibility of behavior change, will get some basic lab work.  CBC and metabolic panel are baseline.  No signs of dehydration or metabolic derangement.  This time with patient generally well in appearance, interactive and cooperative, he is stable to return to his facility.  Final Clinical Impression(s) / ED Diagnoses Final diagnoses:  Fall, initial encounter  H/O: CVA (cerebrovascular accident)  Frequent falls    Rx / DC Orders ED Discharge Orders     None         Wynetta Heckle, MD 07/12/23 [phone]

## 2023-07-12 NOTE — ED Notes (Signed)
 [facility]AR at [facility] ED to pick patient up.

## 2023-07-12 NOTE — ED Triage Notes (Signed)
 Pt BIB [facility] from [facility]. They report they were called out for the foley cath coming out, but when they got there, the cath was in place and functioning properly. When staff was questioned that state that the pt keeps "throwing himself into the floor." So they want him evaluated. Pt denies any injuries or complaints. Pt with hx of dementia and has not mental status changes from baseline.   EMS Vitals  104/78 HR 74 SpO2 98% on RA CBG 136

## 2023-07-12 NOTE — ED Notes (Signed)
 This RN straight stuck patient for lab work. Patient tolerated well.

## 2023-07-12 NOTE — Discharge Instructions (Signed)
 1.  Patient has been examined in the emergency department.  There are no signs of injury.  Patient is alert and in no distress.  All extremities move at baseline.  He does have history of CVA but does not have pain with normal movements. 2.  Basic lab work was checked.  Basic lab work is within patient's normal range.  He is clinically well in appearance.  He can continue his usual level of care.

## 2023-07-12 NOTE — ED Notes (Signed)
 This nurse called [facility] for transport back to facility

## 2023-07-24 ENCOUNTER — Other Ambulatory Visit: Payer: Self-pay | Admitting: Urology

## 2023-07-24 NOTE — Progress Notes (Signed)
 Called Kurt Walton, social worker, regarding who will sign patient's consent for surgery.  He stated that he was not the person who would give consent, will talk to his manager and call me back.

## 2023-07-25 NOTE — Progress Notes (Signed)
 Spoke to [place] at [facility] regarding consent for patient's surgery.  She is going to look into this and call back with the information.

## 2023-07-25 NOTE — Progress Notes (Signed)
 Spoke to Kurt Walton from DSS and was given telephone consent for surgery on 07-28-23.  She will fax over legal guardian paperwork to [phone].

## 2023-07-25 NOTE — Progress Notes (Signed)
 Left message for patient's social worker, Kathaleen Pale, regarding consent for patient's surgery on 07-28-23.

## 2023-07-25 NOTE — Progress Notes (Signed)
 Preop instructions for:  Kurt Walton Date of Birth:  2050/12/13                 Date of Procedure:   07-28-23 Procedure: Circumcision Surgeon:  Dr. Leila Punt Facility contact:     Phone:   [phone] Legal Guardian:  Kurt Walton Kurt Walton [phone]   Transportation contact phone#:   Kurt Walton  Please send day of procedure:  Current med list  Medications taken the day of procedure (return attached form to hospital) confirm time of nothing by mouth status (return attached form to hospital) Patient Demographic info( to include DNR status, problem list, allergies) Bring Insurance card and picture ID    Time to arrive at [facility]:  9:30 AM   Report to: Admitting (On your left hand side)    Do not eat solid food or drink liquids past midnight the night before your procedure.(To include any tube feedings-must be discontinued)   Take these morning medications only with sips of water.(or give through gastrostomy or feeding tube).  Tamsulosin   Stop all vitamins and herbal supplements 7 days before surgery.   Note: No Insulin  or Diabetic meds should be given or taken the morning of the procedure!  Oral Hygiene is also important to reduce your risk of infection.                                    Remember - BRUSH YOUR TEETH THE MORNING OF SURGERY WITH YOUR REGULAR TOOTHPASTE   DENTURES WILL BE REMOVED PRIOR TO SURGERY PLEASE DO NOT APPLY "Poly grip" OR ADHESIVES!!!  Leave all jewelry and other valuables at place where living( no metal or rings to be worn) No contact lens Women-no make-up, no lotions,perfumes,powders Men-no colognes,lotions   Any questions day of procedure,call  [facility]-[phone]     Sent from :[facility]                   Phone:[phone]                   Fax:[phone]   Sent by :  Kurt Lodge.,  RN

## 2023-07-28 ENCOUNTER — Other Ambulatory Visit: Payer: Self-pay

## 2023-07-28 ENCOUNTER — Encounter (HOSPITAL_COMMUNITY)
Admission: RE | Disposition: A | Discharge status: Home / Self Care | Payer: Self-pay | Source: Home / Self Care | Attending: Urology

## 2023-07-28 ENCOUNTER — Ambulatory Visit (HOSPITAL_BASED_OUTPATIENT_CLINIC_OR_DEPARTMENT_OTHER): Payer: Medicare (Managed Care) | Admitting: Anesthesiology

## 2023-07-28 ENCOUNTER — Ambulatory Visit (HOSPITAL_COMMUNITY)
Admission: RE | Admit: 2023-07-28 | Discharge: 2023-07-28 | Disposition: A | Discharge status: Home / Self Care | Payer: Medicare (Managed Care) | Attending: Urology | Admitting: Urology

## 2023-07-28 ENCOUNTER — Encounter (HOSPITAL_COMMUNITY): Payer: Self-pay | Admitting: Urology

## 2023-07-28 ENCOUNTER — Ambulatory Visit (HOSPITAL_COMMUNITY): Payer: Medicare (Managed Care) | Admitting: Anesthesiology

## 2023-07-28 DIAGNOSIS — N481 Balanitis: Secondary | ICD-10-CM | POA: Diagnosis not present

## 2023-07-28 DIAGNOSIS — I69354 Hemiplegia and hemiparesis following cerebral infarction affecting left non-dominant side: Secondary | ICD-10-CM | POA: Diagnosis not present

## 2023-07-28 DIAGNOSIS — R339 Retention of urine, unspecified: Secondary | ICD-10-CM | POA: Diagnosis not present

## 2023-07-28 DIAGNOSIS — R338 Other retention of urine: Secondary | ICD-10-CM

## 2023-07-28 DIAGNOSIS — N471 Phimosis: Secondary | ICD-10-CM | POA: Diagnosis present

## 2023-07-28 DIAGNOSIS — Z87891 Personal history of nicotine dependence: Secondary | ICD-10-CM | POA: Insufficient documentation

## 2023-07-28 DIAGNOSIS — I639 Cerebral infarction, unspecified: Secondary | ICD-10-CM | POA: Diagnosis not present

## 2023-07-28 DIAGNOSIS — I10 Essential (primary) hypertension: Secondary | ICD-10-CM | POA: Insufficient documentation

## 2023-07-28 DIAGNOSIS — F0390 Unspecified dementia without behavioral disturbance: Secondary | ICD-10-CM | POA: Diagnosis not present

## 2023-07-28 DIAGNOSIS — F1911 Other psychoactive substance abuse, in remission: Secondary | ICD-10-CM | POA: Diagnosis not present

## 2023-07-28 HISTORY — PX: CIRCUMCISION: SHX1350

## 2023-07-28 SURGERY — CIRCUMCISION, ADULT
Anesthesia: General

## 2023-07-28 MED ORDER — PHENYLEPHRINE HCL (PRESSORS) 10 MG/ML IV SOLN
INTRAVENOUS | Status: DC | PRN
  Administered 2023-07-28: 160 ug via INTRAVENOUS
  Administered 2023-07-28: 120 ug via INTRAVENOUS

## 2023-07-28 MED ORDER — ONDANSETRON HCL 4 MG/2ML IJ SOLN
INTRAMUSCULAR | Status: DC | PRN
  Administered 2023-07-28: 4 mg via INTRAVENOUS

## 2023-07-28 MED ORDER — DEXAMETHASONE SODIUM PHOSPHATE 10 MG/ML IJ SOLN
INTRAMUSCULAR | Status: DC | PRN
  Administered 2023-07-28: 10 mg via INTRAVENOUS

## 2023-07-28 MED ORDER — ONDANSETRON HCL 4 MG/2ML IJ SOLN: 4.0000 mg | Freq: Once | INTRAMUSCULAR | Status: DC | PRN

## 2023-07-28 MED ORDER — LIDOCAINE HCL (CARDIAC) PF 100 MG/5ML IV SOSY
PREFILLED_SYRINGE | INTRAVENOUS | Status: DC | PRN
  Administered 2023-07-28: 80 mg via INTRAVENOUS

## 2023-07-28 MED ORDER — OXYCODONE HCL 5 MG PO TABS: 5.0000 mg | ORAL_TABLET | Freq: Once | ORAL | Status: DC | PRN

## 2023-07-28 MED ORDER — ACETAMINOPHEN 10 MG/ML IV SOLN: 1000.0000 mg | Freq: Once | INTRAVENOUS | Status: DC | PRN

## 2023-07-28 MED ORDER — CHLORHEXIDINE GLUCONATE 0.12 % MT SOLN
15.0000 mL | Freq: Once | OROMUCOSAL | Status: AC
  Administered 2023-07-28: 15 mL via OROMUCOSAL

## 2023-07-28 MED ORDER — FENTANYL CITRATE (PF) 100 MCG/2ML IJ SOLN
INTRAMUSCULAR | Status: DC | PRN
  Administered 2023-07-28: 75 ug via INTRAVENOUS

## 2023-07-28 MED ORDER — BACITRACIN ZINC 500 UNIT/GM EX OINT
TOPICAL_OINTMENT | CUTANEOUS | Status: DC | PRN
  Administered 2023-07-28: 1 via TOPICAL

## 2023-07-28 MED ORDER — EPHEDRINE 5 MG/ML INJ
INTRAVENOUS | Status: AC
  Filled 2023-07-28: qty 5

## 2023-07-28 MED ORDER — PHENYLEPHRINE 80 MCG/ML (10ML) SYRINGE FOR IV PUSH (FOR BLOOD PRESSURE SUPPORT)
PREFILLED_SYRINGE | INTRAVENOUS | Status: AC
  Filled 2023-07-28: qty 10

## 2023-07-28 MED ORDER — FENTANYL CITRATE (PF) 100 MCG/2ML IJ SOLN
INTRAMUSCULAR | Status: AC
  Filled 2023-07-28: qty 2

## 2023-07-28 MED ORDER — 0.9 % SODIUM CHLORIDE (POUR BTL) OPTIME
TOPICAL | Status: DC | PRN
  Administered 2023-07-28: 1000 mL

## 2023-07-28 MED ORDER — PROPOFOL 10 MG/ML IV BOLUS
INTRAVENOUS | Status: AC
  Filled 2023-07-28: qty 20

## 2023-07-28 MED ORDER — OXYCODONE HCL 5 MG/5ML PO SOLN: 5.0000 mg | Freq: Once | ORAL | Status: DC | PRN

## 2023-07-28 MED ORDER — LACTATED RINGERS IV SOLN: INTRAVENOUS | Status: DC

## 2023-07-28 MED ORDER — CEFTRIAXONE SODIUM 1 G IJ SOLR
1.0000 g | INTRAMUSCULAR | Status: AC
  Administered 2023-07-28: 1 g via INTRAVENOUS
  Filled 2023-07-28: qty 10

## 2023-07-28 MED ORDER — FENTANYL CITRATE PF 50 MCG/ML IJ SOSY: 25.0000 ug | PREFILLED_SYRINGE | INTRAMUSCULAR | Status: DC | PRN

## 2023-07-28 MED ORDER — BUPIVACAINE HCL (PF) 0.25 % IJ SOLN
INTRAMUSCULAR | Status: DC | PRN
  Administered 2023-07-28: 10 mL

## 2023-07-28 MED ORDER — BUPIVACAINE HCL (PF) 0.25 % IJ SOLN
INTRAMUSCULAR | Status: AC
  Filled 2023-07-28: qty 30

## 2023-07-28 MED ORDER — PROPOFOL 1000 MG/100ML IV EMUL
INTRAVENOUS | Status: AC
  Filled 2023-07-28: qty 100

## 2023-07-28 MED ORDER — EPHEDRINE SULFATE (PRESSORS) 50 MG/ML IJ SOLN
INTRAMUSCULAR | Status: DC | PRN
  Administered 2023-07-28 (×2): 5 mg via INTRAVENOUS

## 2023-07-28 MED ORDER — BACITRACIN ZINC 500 UNIT/GM EX OINT
TOPICAL_OINTMENT | CUTANEOUS | Status: AC
  Filled 2023-07-28: qty 28.35

## 2023-07-28 MED ORDER — ORAL CARE MOUTH RINSE: 15.0000 mL | Freq: Once | OROMUCOSAL | Status: AC

## 2023-07-28 MED ORDER — PROPOFOL 500 MG/50ML IV EMUL
INTRAVENOUS | Status: DC | PRN
  Administered 2023-07-28: 50 ug/kg/min via INTRAVENOUS
  Administered 2023-07-28: 100 mg via INTRAVENOUS

## 2023-07-28 SURGICAL SUPPLY — 25 items
BAG COUNTER SPONGE SURGICOUNT (BAG) IMPLANT
BLADE SURG 15 STRL LF DISP TIS (BLADE) ×1 IMPLANT
BNDG COHESIVE 1.5X5 TAN NS LF (GAUZE/BANDAGES/DRESSINGS) ×1 IMPLANT
COVER SURGICAL LIGHT HANDLE (MISCELLANEOUS) ×1 IMPLANT
DRAPE LAPAROTOMY T 98X78 PEDS (DRAPES) ×1 IMPLANT
ELECT REM PT RETURN 15FT ADLT (MISCELLANEOUS) ×1 IMPLANT
GAUZE 4X4 16PLY ~~LOC~~+RFID DBL (SPONGE) ×1 IMPLANT
GAUZE PETROLATUM 1 X8 (GAUZE/BANDAGES/DRESSINGS) ×1 IMPLANT
GLOVE BIO SURGEON STRL SZ7.5 (GLOVE) ×1 IMPLANT
GOWN STRL REUS W/ TWL XL LVL3 (GOWN DISPOSABLE) ×1 IMPLANT
KIT BASIN OR (CUSTOM PROCEDURE TRAY) ×1 IMPLANT
KIT TURNOVER KIT A (KITS) IMPLANT
NDL HYPO 22X1.5 SAFETY MO (MISCELLANEOUS) ×1 IMPLANT
NEEDLE HYPO 22X1.5 SAFETY MO (MISCELLANEOUS) ×1 IMPLANT
NS IRRIG 1000ML POUR BTL (IV SOLUTION) IMPLANT
PACK BASIC VI WITH GOWN DISP (CUSTOM PROCEDURE TRAY) ×1 IMPLANT
PENCIL SMOKE EVACUATOR (MISCELLANEOUS) IMPLANT
SPIKE FLUID TRANSFER (MISCELLANEOUS) ×1 IMPLANT
SUT CHROMIC 3 0 PS 2 (SUTURE) IMPLANT
SUT CHROMIC 3 0 SH 27 (SUTURE) ×2 IMPLANT
SUT SILK 2-0 18XBRD TIE 12 (SUTURE) IMPLANT
SYR CONTROL 10ML LL (SYRINGE) IMPLANT
TOWEL OR 17X26 10 PK STRL BLUE (TOWEL DISPOSABLE) ×1 IMPLANT
TRAY FOLEY MTR SLVR 16FR STAT (SET/KITS/TRAYS/PACK) IMPLANT
WATER STERILE IRR 1000ML POUR (IV SOLUTION) IMPLANT

## 2023-07-28 NOTE — Transfer of Care (Signed)
 Immediate Anesthesia Transfer of Care Note  Patient: Kurt Walton  Procedure(s) Performed: DORSAL SLIT  Patient Location: PACU  Anesthesia Type:General  Level of Consciousness: sedated  Airway & Oxygen Therapy: Patient Spontanous Breathing and Patient connected to face mask oxygen  Post-op Assessment: Report given to RN and Post -op Vital signs reviewed and stable  Post vital signs: Reviewed and stable  Last Vitals:  Vitals Value Taken Time  BP 107/67 07/28/23 1323  Temp    Pulse 69 07/28/23 1327  Resp 12 07/28/23 1327  SpO2 100 % 07/28/23 1327  Vitals shown include unfiled device data.  Last Pain:  Vitals:   07/28/23 1121  TempSrc:   PainSc: 6          Complications: No notable events documented.

## 2023-07-28 NOTE — Anesthesia Preprocedure Evaluation (Signed)
 Anesthesia Evaluation  Patient identified by MRN, date of birth, ID band Patient awake    Reviewed: Allergy & Precautions, NPO status , Patient's Chart, lab work & pertinent test results, reviewed documented beta blocker date and time , Unable to perform ROS - Chart review only  History of Anesthesia Complications Negative for: history of anesthetic complications  Airway Mallampati: II  TM Distance: >3 FB     Dental  (+) Missing, Poor Dentition   Pulmonary shortness of breath, former smoker   breath sounds clear to auscultation       Cardiovascular hypertension, (-) CHF, (-) Orthopnea and (-) DVT  Rhythm:Regular Rate:Normal  1. Left ventricular ejection fraction, by estimation, is 60 to 65%. The  left ventricle has normal function. The left ventricle has no regional  wall motion abnormalities. Left ventricular diastolic parameters were  normal.   2. Right ventricular systolic function is normal. The right ventricular  size is normal. Tricuspid regurgitation signal is inadequate for assessing  PA pressure.   3. The mitral valve is normal in structure. Trivial mitral valve  regurgitation. No evidence of mitral stenosis.   4. The aortic valve is normal in structure. Aortic valve regurgitation is  not visualized. No aortic stenosis is present.      Neuro/Psych  PSYCHIATRIC DISORDERS     Dementia CVA, Residual Symptoms    GI/Hepatic ,neg GERD  ,,(+) neg Cirrhosis        Endo/Other    Renal/GU ARFRenal disease     Musculoskeletal   Abdominal   Peds  Hematology  (+) Blood dyscrasia, anemia   Anesthesia Other Findings   Reproductive/Obstetrics                              Anesthesia Physical Anesthesia Plan  ASA: 3  Anesthesia Plan: General   Post-op Pain Management:    Induction: Intravenous  PONV Risk Score and Plan: 2 and Ondansetron  and Dexamethasone  Airway Management  Planned: LMA  Additional Equipment:   Intra-op Plan:   Post-operative Plan: Extubation in OR  Informed Consent: I have reviewed the patients History and Physical, chart, labs and discussed the procedure including the risks, benefits and alternatives for the proposed anesthesia with the patient or authorized representative who has indicated his/her understanding and acceptance.     Dental advisory given  Plan Discussed with: CRNA  Anesthesia Plan Comments:          Anesthesia Quick Evaluation

## 2023-07-28 NOTE — Op Note (Signed)
 Operative Note  Preoperative diagnosis:  1.  Phimosis, chronic balanitis  Postoperative diagnosis: 1.  Same  Procedure(s): 1.  Dorsal slit  Surgeon: Leila Punt, MD  Assistants: Alla Ar, NP  Anesthesia: General  Complications: None immediate  EBL: 10 cc  Specimens: 1.  None  Drains/Catheters: 1.  16 French Foley catheter  Intraoperative findings: Significant phimosis.  Upon retraction of foreskin, the foreskin was densely adherent to the glans circumferentially so I elected to perform dorsal slit only rather than completion circumcision  Indication: 73 year old male with phimosis and urinary retention presents for the previously mentioned operation.  Description of procedure:  The patient was identified and consent was obtained.  The patient was taken to the operating room and placed in the supine position.  The patient was placed under general anesthesia.  Perioperative antibiotics were administered. Patient was prepped and draped in a standard sterile fashion and a timeout was performed.  I first used hemostats to expand the phimotic foreskin.  I was still unable to retract the foreskin so I used Metzenbaum scissors to make a dorsal slit.  This allowed me to retract the foreskin.  However, the foreskin was densely adherent to the glans.  I felt that a complete circumcision would do more harm than good so I decided to just perform a dorsal slit.  I extended this incision followed by closure of the skin edges with running 3-0 chromic.  Spot electrocautery was used for hemostasis.  I was able to easily expose the glans and retract the foreskin for access to the glans after the dorsal slit.  There was good hemostasis.  I performed a dorsal nerve block with quarter percent Marcaine .  I replaced the Foley catheter.  I applied bacitracin  to the wound edges.  This concluded the operation.  Patient tolerated the procedure well was stable postoperatively.  Plan: He may  undergo a voiding trial at the next visit but may ultimately require chronic Foley.  If he requires chronic Foley, may be beneficial to switch over to a suprapubic tube at some point.

## 2023-07-28 NOTE — Discharge Instructions (Signed)
 Apply bacitracin  twice daily to the wound edges.

## 2023-07-28 NOTE — Anesthesia Procedure Notes (Addendum)
 Procedure Name: LMA Insertion Date/Time: 07/28/2023 12:41 PM  Performed by: Judd Northern, RNPre-anesthesia Checklist: Patient identified, Emergency Drugs available, Suction available, Patient being monitored and Timeout performed Patient Re-evaluated:Patient Re-evaluated prior to induction Oxygen Delivery Method: Circle system utilized Preoxygenation: Pre-oxygenation with 100% oxygen Induction Type: IV induction Ventilation: Mask ventilation without difficulty and Oral airway inserted - appropriate to patient size LMA: LMA inserted LMA Size: 4.0 Number of attempts: 2 Placement Confirmation: positive ETCO2 and breath sounds checked- equal and bilateral Tube secured with: Tape Dental Injury: Teeth and Oropharynx as per pre-operative assessment  Comments: Attempted with Ambu LMA size 5, unable to seat. Placed straight LMA size 4.

## 2023-07-28 NOTE — H&P (Signed)
 CC/HPI: 05/30/2023: New patient to clinic. He is a 73yo male with history of dementia, hypertension, previous history of alcohol use/polysubstance abuse, CVA with left residual hemiparesis who was brought to the ED on 2/9 from [facility] SNF/rehab after he fell trying to get into bed from his wheelchair. There was no LOC. Workup including CT imaging showed massive bladder distention with significant bilateral hydroureteronephrosis as well as severe phimosis. He was not able to void and there is also red-tinged urine coming from his penis. Urology consulted. Dr. Ellin Gutter ultimately placed a sensor wire via the urethral meatus all the way into the bladder, a council tip catheter was then placed over the guidewire and into the bladder where copious amounts of urine was drained. Initial creatinine was 3.88 but by time of hospital discharge on 2/12 it had improved to 1.12 with GFR greater than 60. Urine culture assessed positive for procidentia. Initially treated with ceftriaxone  and then transition to Augmentin  for couple more days at time of discharge back to his SNF.   Patient is a poor historian given past history of dementia. He is oriented to self and place but cannot provide any accurate history regarding HPI. He is only accompanied by a transport aide today. History obtained via documentation received from his facility including MAR as well as past notes in Epic.   06/16/2023: The patient's catheter was exchanged over guidewire at last exam. Culture obtained at that time grew ESBL E. coli. He was admitted 2 days later with symptoms of sepsis. He initially required pressors to maintain blood pressure. He was rehydrated with IV fluids and received IV meropenem  for total of 7 days before discharge back to his facility. Returns today for follow-up evaluation. Appears that he had a catheter exchanged in the emergency department upon initial presentation, also started on tamsulosin . He is currently scheduled for  outpatient circumcision with Dr. Parke Boll on 14 April.   He presents with transport aide today. His catheter is not draining on my exam. He is only oriented to self which is consistent with prior baseline.   07/28/23 Patient presents for circumcision. Had to cancel last time because we could not get into contact with the POA for consent.    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 History of falling Hypercholesterolemia Hypertension Obstructive hydrocephalus Restlessness and agitation Vascular dementia, unspecified severity, with other behavioral disturbance Venous insufficiency (chronic) (peripheral) Vitamin D deficiency, unspecified    FAMILY HISTORY: No Family History    SOCIAL HISTORY: Marital Status: Divorced Preferred Language: English; Ethnicity: Not Hispanic Or Latino; Race: Black or African American    REVIEW OF SYSTEMS:    GU Review Male:   Patient denies frequent urination, hard to postpone urination, burning/ pain with urination, get up at night to urinate, leakage of urine, stream starts and stops, trouble starting your stream, have to strain to urinate , erection problems, and penile pain.  Gastrointestinal (Upper):  Patient denies nausea, vomiting, and indigestion/ heartburn.  Gastrointestinal (Lower):   Patient denies diarrhea and constipation.  Constitutional:    Patient denies fever, night sweats, weight loss, and fatigue.  Skin:   Patient denies skin rash/ lesion and itching.  Eyes:   Patient denies blurred vision and double vision.  Ears/ Nose/ Throat:   Patient denies sore throat and sinus problems.  Hematologic/Lymphatic:   Patient denies swollen glands and easy bruising.  Cardiovascular:   Patient denies chest pains and leg swelling.  Respiratory:   Patient denies cough and shortness of breath.  Endocrine:   Patient denies excessive thirst.  Musculoskeletal:   Patient denies back pain and joint pain.  Neurological:   Patient denies headaches and dizziness.  Psychologic:   Patient denies depression and anxiety.   BP 109/72   Pulse 63   Temp (!) 97.3 F (36.3 C) (Oral)   Resp 18   SpO2 100%    GU PHYSICAL EXAMINATION:    Penis: Penis uncircumcised, severe phimosis with Penile foley catheter present with no urine output noted in collection bag. The catheter was routed up and over his clothing with bag in his wheelchair.   MULTI-SYSTEM PHYSICAL EXAMINATION:    Constitutional: Well-nourished. No physical deformities.  Neck: Neck symmetrical, not swollen. Normal tracheal position.  Respiratory: No labored breathing, no use of accessory muscles.   Cardiovascular: Normal temperature, normal extremity pulses, no swelling, no varicosities.  Skin: No paleness, no jaundice, no cyanosis. No lesion, no ulcer, no rash.  Neurologic / Psychiatric: Not oriented to time, place. Oriented to person. No depression, no anxiety, no agitation.   Gastrointestinal: No mass, no tenderness, no rigidity, non obese abdomen.  Musculoskeletal: Normal gait and station of head and neck.    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

## 2023-07-29 ENCOUNTER — Encounter (HOSPITAL_COMMUNITY): Payer: Self-pay | Admitting: Urology

## 2023-07-29 NOTE — Anesthesia Postprocedure Evaluation (Addendum)
 Anesthesia Post Note  Patient: Kurt Walton  Procedure(s) Performed: DORSAL SLIT     Patient location during evaluation: PACU Anesthesia Type: General Level of consciousness: awake and alert Pain management: pain level controlled Vital Signs Assessment: post-procedure vital signs reviewed and stable Respiratory status: spontaneous breathing, nonlabored ventilation, respiratory function stable and patient connected to nasal cannula oxygen Cardiovascular status: blood pressure returned to baseline and stable Postop Assessment: no apparent nausea or vomiting Anesthetic complications: no   No notable events documented.  Last Vitals:  Vitals:   07/28/23 1400 07/28/23 1445  BP: 123/80 128/76  Pulse: 73 72  Resp: 18 18  Temp: (!) 36.4 C   SpO2: 100% 98%    Last Pain:  Vitals:   07/28/23 1445  TempSrc:   PainSc: 0-No pain                 [name]
